# Patient Record
Sex: Male | Born: 1950
Health system: Southern US, Community
[De-identification: ages and names within clinical notes are randomized; demographics above are authoritative.]

## PROBLEM LIST (undated history)

## (undated) DIAGNOSIS — E785 Hyperlipidemia, unspecified: Secondary | ICD-10-CM

## (undated) DIAGNOSIS — I10 Essential (primary) hypertension: Secondary | ICD-10-CM

## (undated) DIAGNOSIS — I2510 Atherosclerotic heart disease of native coronary artery without angina pectoris: Secondary | ICD-10-CM

## (undated) DIAGNOSIS — E119 Type 2 diabetes mellitus without complications: Secondary | ICD-10-CM

## (undated) DIAGNOSIS — C801 Malignant (primary) neoplasm, unspecified: Secondary | ICD-10-CM

## (undated) DIAGNOSIS — M2550 Pain in unspecified joint: Secondary | ICD-10-CM

## (undated) DIAGNOSIS — I252 Old myocardial infarction: Secondary | ICD-10-CM

## (undated) DIAGNOSIS — U071 COVID-19: Secondary | ICD-10-CM

## (undated) HISTORY — PX: CORONARY ANGIOPLASTY WITH STENT PLACEMENT: SHX49

## (undated) HISTORY — DX: Hyperlipidemia, unspecified: E78.5

## (undated) HISTORY — PX: CARDIAC CATHETERIZATION: SHX172

## (undated) HISTORY — DX: Malignant (primary) neoplasm, unspecified: C80.1

## (undated) HISTORY — DX: Atherosclerotic heart disease of native coronary artery without angina pectoris: I25.10

## (undated) HISTORY — DX: Type 2 diabetes mellitus without complications: E11.9

## (undated) HISTORY — DX: Pain in unspecified joint: M25.50

## (undated) HISTORY — DX: Essential (primary) hypertension: I10

## (undated) HISTORY — PX: COLON SURGERY: SHX602

## (undated) HISTORY — DX: COVID-19: U07.1

---

## 1998-04-08 ENCOUNTER — Inpatient Hospital Stay (HOSPITAL_COMMUNITY): Admission: EM | Admit: 1998-04-08 | Discharge: 1998-04-11 | Payer: Self-pay | Admitting: Cardiology

## 1999-11-06 ENCOUNTER — Ambulatory Visit (HOSPITAL_BASED_OUTPATIENT_CLINIC_OR_DEPARTMENT_OTHER): Admission: RE | Admit: 1999-11-06 | Discharge: 1999-11-06 | Payer: Self-pay | Admitting: General Surgery

## 1999-11-06 ENCOUNTER — Encounter (INDEPENDENT_AMBULATORY_CARE_PROVIDER_SITE_OTHER): Payer: Self-pay | Admitting: *Deleted

## 2004-05-06 ENCOUNTER — Ambulatory Visit: Payer: Self-pay | Admitting: Cardiology

## 2008-06-14 ENCOUNTER — Ambulatory Visit: Admission: RE | Admit: 2008-06-14 | Discharge: 2008-08-30 | Payer: Self-pay | Admitting: Radiation Oncology

## 2008-08-30 ENCOUNTER — Ambulatory Visit: Admission: RE | Admit: 2008-08-30 | Discharge: 2008-10-27 | Payer: Self-pay | Admitting: Radiation Oncology

## 2010-11-13 NOTE — Op Note (Signed)
[facility]  Patient:    Arthur Torres, Arthur Torres                       MRN: [id number] Proc. Date: 11/06/99 Adm. Date:  [id number] Disc. Date: [id number] Attending:  Brandy Hale CC:         Everardo All. Madilyn Fireman, M.D.             Monica Becton, M.D.                           Operative Report  PREOPERATIVE DIAGNOSIS:  Internal and external hemorrhoids, pruritis ani.  POSTOPERATIVE DIAGNOSIS:  Internal and external hemorrhoids, pruritis ani.  OPERATION PERFORMED:  Rigid proctoscopy, injection of sclerosing solution into internal hemorrhoids, excision large posterior external hemorrhoid, anal stretch.  SURGEON:  Angelia Mould. Derrell Lolling, M.D.  ANESTHESIA:  INDICATIONS FOR PROCEDURE:  The patient is a 60 year old white male with a several year history of intermittent episodes of hemorrhoidal swelling.  For the past year, he has had large hemorrhoid on the left side posteriorly which has not resolved and also had severe itching.  He was evaluated recently approximately four weeks ago and was found to have a large noninflamed external hemorrhoid tag on the left posterior position and significant pruritis ani.  He had some internal hemorrhoids which were stage 2, otherwise normal looking rectal mucosa.  He has been treated aggressively for his pruritis ani and that has improved a great deal.  He was brought to the operating room for surgrey on his hemorrhoids.  DESCRIPTION OF PROCEDURE:  Following the induction of general endotracheal anesthesia, the patient was placed in the dorsal lithotomy position.  The perianal area was prepped and draped in sterile fashion.  Rigid proctoscopy was carried out to 25 cm.  The mucosa of the distal sigmoid and rectum looked normal.  There were no polyps, no gross tumor, no signs of inflammatory change.  The proctoscope was removed.  Anoscope was inserted.  I found that he had circumferential internal hemorrhoids which  were not prolapsed.  These appeared to be quite amenable to injection therapy and I injected sclerosing solution into the hemorrhoids in the left lateral, right posterior and right anterior positions  I then gently performed anal dilatation over the space of about five minutes to stretch the anal sphincter muscles.  This was performed until I could insert four fingers into the rectum without much problem.  Anoscope was inserted.  The large hemorrhoid in the left posterior position was identified and placed on traction.  A transfixion suture of 2-0 chromic was placed in the rectal mucosa above this hemorrhoidal pile.  Excision of the large hemorrhoid was performed with electrocautery.  The mucosa, dentate line, and anoderm were closed carefully with a running suture of 2-0 chromic.  The hemorrhoid was sent to pathology.  Hemostasis was excellent and achieved with electrocautery and suture.  At the completion of the case there was no significant external hemorrhoids and there was no bleeding.  A clean external bandage was placed.  The patient was taken to the recovery room instable condition.  Estimated blood loss was about 15 to 20 cc.  Complications were none.  Sponge, needle and instrument counts were correct. DD:  11/06/99 TD:  11/09/99 Job: [id number]

## 2012-10-03 ENCOUNTER — Ambulatory Visit (INDEPENDENT_AMBULATORY_CARE_PROVIDER_SITE_OTHER): Payer: BC Managed Care – PPO | Admitting: Nurse Practitioner

## 2012-10-03 ENCOUNTER — Encounter: Payer: Self-pay | Admitting: Nurse Practitioner

## 2012-10-03 VITALS — BP 113/68 | HR 63 | Temp 96.5°F | Ht 70.0 in | Wt 190.0 lb

## 2012-10-03 DIAGNOSIS — Z9861 Coronary angioplasty status: Secondary | ICD-10-CM

## 2012-10-03 DIAGNOSIS — E785 Hyperlipidemia, unspecified: Secondary | ICD-10-CM

## 2012-10-03 DIAGNOSIS — E559 Vitamin D deficiency, unspecified: Secondary | ICD-10-CM

## 2012-10-03 DIAGNOSIS — E119 Type 2 diabetes mellitus without complications: Secondary | ICD-10-CM | POA: Insufficient documentation

## 2012-10-03 DIAGNOSIS — E1169 Type 2 diabetes mellitus with other specified complication: Secondary | ICD-10-CM | POA: Insufficient documentation

## 2012-10-03 DIAGNOSIS — I10 Essential (primary) hypertension: Secondary | ICD-10-CM

## 2012-10-03 DIAGNOSIS — Z955 Presence of coronary angioplasty implant and graft: Secondary | ICD-10-CM

## 2012-10-03 LAB — COMPLETE METABOLIC PANEL WITH GFR
ALT: 24 U/L (ref 0–53)
AST: 18 U/L (ref 0–37)
Albumin: 4.5 g/dL (ref 3.5–5.2)
Alkaline Phosphatase: 65 U/L (ref 39–117)
BUN: 28 mg/dL — ABNORMAL HIGH (ref 6–23)
CO2: 26 mEq/L (ref 19–32)
Calcium: 9.9 mg/dL (ref 8.4–10.5)
Chloride: 102 mEq/L (ref 96–112)
Creat: 1.11 mg/dL (ref 0.50–1.35)
GFR, Est African American: 84 mL/min
GFR, Est Non African American: 72 mL/min
Glucose, Bld: 120 mg/dL — ABNORMAL HIGH (ref 70–99)
Potassium: 4.5 mEq/L (ref 3.5–5.3)
Sodium: 137 mEq/L (ref 135–145)
Total Bilirubin: 0.7 mg/dL (ref 0.3–1.2)
Total Protein: 7.4 g/dL (ref 6.0–8.3)

## 2012-10-03 LAB — POCT GLYCOSYLATED HEMOGLOBIN (HGB A1C): Hemoglobin A1C: 6

## 2012-10-03 NOTE — Patient Instructions (Signed)
Health Maintenance, Males A healthy lifestyle and preventative care can promote health and wellness.  Maintain regular health, dental, and eye exams.  Eat a healthy diet. Foods like vegetables, fruits, whole grains, low-fat dairy products, and lean protein foods contain the nutrients you need without too many calories. Decrease your intake of foods high in solid fats, added sugars, and salt. Get information about a proper diet from your caregiver, if necessary.  Regular physical exercise is one of the most important things you can do for your health. Most adults should get at least 150 minutes of moderate-intensity exercise (any activity that increases your heart rate and causes you to sweat) each week. In addition, most adults need muscle-strengthening exercises on 2 or more days a week.   Maintain a healthy weight. The body mass index (BMI) is a screening tool to identify possible weight problems. It provides an estimate of body fat based on height and weight. Your caregiver can help determine your BMI, and can help you achieve or maintain a healthy weight. For adults 20 years and older:  A BMI below 18.5 is considered underweight.  A BMI of 18.5 to 24.9 is normal.  A BMI of 25 to 29.9 is considered overweight.  A BMI of 30 and above is considered obese.  Maintain normal blood lipids and cholesterol by exercising and minimizing your intake of saturated fat. Eat a balanced diet with plenty of fruits and vegetables. Blood tests for lipids and cholesterol should begin at age 20 and be repeated every 5 years. If your lipid or cholesterol levels are high, you are over 50, or you are a high risk for heart disease, you may need your cholesterol levels checked more frequently.Ongoing high lipid and cholesterol levels should be treated with medicines, if diet and exercise are not effective.  If you smoke, find out from your caregiver how to quit. If you do not use tobacco, do not start.  If you  choose to drink alcohol, do not exceed 2 drinks per day. One drink is considered to be 12 ounces (355 mL) of beer, 5 ounces (148 mL) of wine, or 1.5 ounces (44 mL) of liquor.  Avoid use of street drugs. Do not share needles with anyone. Ask for help if you need support or instructions about stopping the use of drugs.  High blood pressure causes heart disease and increases the risk of stroke. Blood pressure should be checked at least every 1 to 2 years. Ongoing high blood pressure should be treated with medicines if weight loss and exercise are not effective.  If you are 45 to 60 years old, ask your caregiver if you should take aspirin to prevent heart disease.  Diabetes screening involves taking a blood sample to check your fasting blood sugar level. This should be done once every 3 years, after age 45, if you are within normal weight and without risk factors for diabetes. Testing should be considered at a younger age or be carried out more frequently if you are overweight and have at least 1 risk factor for diabetes.  Colorectal cancer can be detected and often prevented. Most routine colorectal cancer screening begins at the age of 50 and continues through age 75. However, your caregiver may recommend screening at an earlier age if you have risk factors for colon cancer. On a yearly basis, your caregiver may provide home test kits to check for hidden blood in the stool. Use of a small camera at the end of a tube,   to directly examine the colon (sigmoidoscopy or colonoscopy), can detect the earliest forms of colorectal cancer. Talk to your caregiver about this at age 50, when routine screening begins. Direct examination of the colon should be repeated every 5 to 10 years through age 75, unless early forms of pre-cancerous polyps or small growths are found.  Hepatitis C blood testing is recommended for all people born from 1945 through 1965 and any individual with known risks for hepatitis C.  Healthy  men should no longer receive prostate-specific antigen (PSA) blood tests as part of routine cancer screening. Consult with your caregiver about prostate cancer screening.  Testicular cancer screening is not recommended for adolescents or adult males who have no symptoms. Screening includes self-exam, caregiver exam, and other screening tests. Consult with your caregiver about any symptoms you have or any concerns you have about testicular cancer.  Practice safe sex. Use condoms and avoid high-risk sexual practices to reduce the spread of sexually transmitted infections (STIs).  Use sunscreen with a sun protection factor (SPF) of 30 or greater. Apply sunscreen liberally and repeatedly throughout the day. You should seek shade when your shadow is shorter than you. Protect yourself by wearing long sleeves, pants, a wide-brimmed hat, and sunglasses year round, whenever you are outdoors.  Notify your caregiver of new moles or changes in moles, especially if there is a change in shape or color. Also notify your caregiver if a mole is larger than the size of a pencil eraser.  A one-time screening for abdominal aortic aneurysm (AAA) and surgical repair of large AAAs by sound wave imaging (ultrasonography) is recommended for ages 65 to 75 years who are current or former smokers.  Stay current with your immunizations. Document Released: 12/11/2007 Document Revised: 09/06/2011 Document Reviewed: 11/09/2010 [employer] Patient Information 2013 [employer].  

## 2012-10-03 NOTE — Progress Notes (Signed)
Subjective:    Patient ID: Arthur Torres, male    DOB: 1951/01/28, 62 y.o.   MRN: [id number]  Hyperlipidemia This is a chronic problem. The current episode started more than 1 year ago. The problem is controlled. Recent lipid tests were reviewed and are normal. Exacerbating diseases include diabetes. There are no known factors aggravating his hyperlipidemia. Pertinent negatives include no chest pain, focal weakness, leg pain, myalgias or shortness of breath. Current antihyperlipidemic treatment includes bile acid squestrants and ezetimibe. The current treatment provides significant improvement of lipids. There are no compliance problems.  Risk factors for coronary artery disease include diabetes mellitus, hypertension and male sex.  Diabetes He presents for his follow-up diabetic visit. He has type 2 diabetes mellitus. No MedicAlert identification noted. The initial diagnosis of diabetes was made 6 months ago. His disease course has been stable. There are no hypoglycemic associated symptoms. Pertinent negatives for hypoglycemia include no headaches. Associated symptoms include polyuria. Pertinent negatives for diabetes include no chest pain, no fatigue, no polydipsia, no polyphagia, no visual change, no weakness and no weight loss. There are no hypoglycemic complications. Symptoms are stable. There are no diabetic complications. Risk factors for coronary artery disease include dyslipidemia, hypertension, male sex and family history. Current diabetic treatment includes oral agent (monotherapy) and diet. He is compliant with treatment all of the time. His weight is stable. He is following a generally healthy diet. When asked about meal planning, he reported none. He has not had a previous visit with a dietician. He rarely participates in exercise. His home blood glucose trend is decreasing steadily. His breakfast blood glucose is taken between 7-8 am. His breakfast blood glucose range is generally 110-130  mg/dl. His overall blood glucose range is 110-130 mg/dl. An ACE inhibitor/angiotensin II receptor blocker is not being taken. He does not see a podiatrist.Eye exam is not current (at least 2 years ago).  Hypertension This is a chronic problem. The current episode started more than 1 year ago. The problem is unchanged. The problem is controlled. Pertinent negatives include no chest pain, headaches, malaise/fatigue, neck pain, palpitations or shortness of breath. There are no associated agents to hypertension. Risk factors for coronary artery disease include diabetes mellitus, dyslipidemia and male gender. Past treatments include beta blockers. The current treatment provides significant improvement. There are no compliance problems.       Review of Systems  Constitutional: Negative for weight loss, malaise/fatigue and fatigue.  HENT: Negative for neck pain.   Respiratory: Negative for shortness of breath.   Cardiovascular: Negative for chest pain and palpitations.  Endocrine: Positive for polyuria. Negative for polydipsia and polyphagia.  Musculoskeletal: Negative for myalgias.  Neurological: Negative for focal weakness, weakness and headaches.  All other systems reviewed and are negative.       Objective:   Physical Exam  Constitutional: He is oriented to person, place, and time. He appears well-developed and well-nourished.  HENT:  Head: Normocephalic.  Right Ear: External ear normal.  Left Ear: External ear normal.  Nose: Nose normal.  Mouth/Throat: Oropharynx is clear and moist.  Eyes: EOM are normal. Pupils are equal, round, and reactive to light.  Neck: Normal range of motion. Neck supple. No JVD present. Carotid bruit is not present. No thyromegaly present.  Cardiovascular: Normal rate, regular rhythm, normal heart sounds and intact distal pulses.   No murmur heard. Pulmonary/Chest: Effort normal and breath sounds normal. He has no wheezes. He has no rales.  Abdominal: Soft.  Bowel sounds are  normal.  Musculoskeletal: Normal range of motion.  Neurological: He is alert and oriented to person, place, and time. He has normal reflexes.  (+) monofilament bil feet  Skin: Skin is warm and dry.  No callus formation bil feet  Psychiatric: He has a normal mood and affect. His behavior is normal. Judgment and thought content normal.   BP 113/68  Pulse 63  Temp(Src) 96.5 F (35.8 C) (Oral)  Ht 5\' 10"  (1.778 m)  Wt 190 lb (86.183 kg)  BMI 27.26 kg/m2 Results for orders placed in visit on 10/03/12  POCT GLYCOSYLATED HEMOGLOBIN (HGB A1C)      Result Value Range   Hemoglobin A1C 6.0            Assessment & Plan:  1. HTN (hypertension) Watch NA+ in diet - POCT glycosylated hemoglobin (Hb A1C) - COMPLETE METABOLIC PANEL WITH GFR - NMR Lipoprofile with Lipids  2. Hyperlipidemia LDL goal < 100 Low fat diet and exercise - POCT glycosylated hemoglobin (Hb A1C) - COMPLETE METABOLIC PANEL WITH GFR - NMR Lipoprofile with Lipids  3. Unspecified vitamin D deficiency - Vitamin D 25 hydroxy  4. Type II or unspecified type diabetes mellitus without mention of complication, not stated as uncontrolled Continue diabetic diet Continue Metformin as RX  5. Hx of right coronary artery stent placement - Ambulatory referral to Cardiology  Hemocult cards given to patient  Mary-Margaret Daphine Deutscher, FNP

## 2012-10-04 ENCOUNTER — Ambulatory Visit (INDEPENDENT_AMBULATORY_CARE_PROVIDER_SITE_OTHER): Payer: Self-pay | Admitting: Cardiology

## 2012-10-04 ENCOUNTER — Telehealth: Payer: Self-pay | Admitting: Nurse Practitioner

## 2012-10-04 ENCOUNTER — Other Ambulatory Visit: Payer: Self-pay | Admitting: Nurse Practitioner

## 2012-10-04 VITALS — BP 117/74 | HR 60 | Ht 70.0 in | Wt 190.0 lb

## 2012-10-04 DIAGNOSIS — I25810 Atherosclerosis of coronary artery bypass graft(s) without angina pectoris: Secondary | ICD-10-CM

## 2012-10-04 LAB — NMR LIPOPROFILE WITH LIPIDS
Cholesterol, Total: 158 mg/dL (ref <200)
HDL Particle Number: 28.2 umol/L — ABNORMAL LOW (ref >=30.5)
HDL Size: 8.5 nm — ABNORMAL LOW (ref >=9.2)
HDL-C: 39 mg/dL — ABNORMAL LOW (ref >=40)
LDL (calc): 101 mg/dL — ABNORMAL HIGH (ref <100)
LDL Particle Number: 1082 nmol/L — ABNORMAL HIGH (ref <1000)
LDL Size: 20.5 nm — ABNORMAL LOW (ref >20.5)
LP-IR Score: 59 — ABNORMAL HIGH (ref <=45)
Large HDL-P: <1.3 umol/L — ABNORMAL LOW (ref >=4.8)
Large VLDL-P: 1.6 nmol/L (ref <=2.7)
Small LDL Particle Number: 619 nmol/L — ABNORMAL HIGH (ref <=527)
Triglycerides: 89 mg/dL (ref <150)
VLDL Size: 44 nm (ref <=46.6)

## 2012-10-04 LAB — VITAMIN D 25 HYDROXY (VIT D DEFICIENCY, FRACTURES): Vit D, 25-Hydroxy: 13 ng/mL — ABNORMAL LOW (ref 30–89)

## 2012-10-04 MED ORDER — VITAMIN D3 1.25 MG (50000 UT) PO CAPS: 1.0000 | ORAL_CAPSULE | Freq: Every day | ORAL | Status: DC

## 2012-10-04 NOTE — Patient Instructions (Addendum)
The current medical regimen is effective;  continue present plan and medications.  Your physician has requested that you have an exercise tolerance test. For further information please visit [email or web address]. Please also follow instruction sheet, as given.   

## 2012-10-04 NOTE — Progress Notes (Signed)
HPI The patient presents for followup of known coronary disease. It has been several years since I saw him. He did have PCI and stenting in 1999. He denies any ongoing cardiovascular symptoms. Since that time he's had none of the chest discomfort that was his unstable angina. The patient denies any new symptoms such as chest discomfort, neck or arm discomfort. There has been no new shortness of breath, PND or orthopnea. There have been no reported palpitations, presyncope or syncope.  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 He cannot bring on any of the symptoms. However, it has been so long since his last evaluation he didn't want to be seen in followup. He's had no recent cardiovascular testing.  Of note he has been treated for prostate cancer since I last saw him. He  Allergies  Allergen Reactions  . Penicillins   . Zocor (Simvastatin)     Current Outpatient Prescriptions  Medication Sig Dispense Refill  . colesevelam (WELCHOL) 625 MG tablet Take 1,875 mg by mouth 2 (two) times daily with a meal.      . ezetimibe (ZETIA) 10 MG tablet Take 10 mg by mouth daily.      . metFORMIN (GLUCOPHAGE-XR) 500 MG 24 hr tablet Take 500 mg by mouth daily with breakfast.      . metoprolol succinate (TOPROL-XL) 50 MG 24 hr tablet Take 50 mg by mouth daily. Take 1/2 tab daily with or immediately following a meal.       No current facility-administered medications for this visit.    Past Medical History  Diagnosis Date  . Hypertension   . Hyperlipidemia   . Diabetes mellitus without complication   . CAD (coronary artery disease)   . Cancer     prostate  . Joint pain     Past Surgical History  Procedure Laterality Date  . Cardiac catheterization    . Coronary angioplasty with stent placement    . Colon surgery      hemrrhoids    Family History  Problem Relation Age of Onset  . Diabetes Mother   . Stroke Mother   . Cancer Father     skin, prostate, stomach     History   Social History  . Marital Status: Married    Spouse Name: N/A    Number of Children: N/A  . Years of Education: N/A   Occupational History  . Not on file.   Social History Main Topics  . Smoking status: Former Smoker    Types: Cigarettes    Quit date: 03/28/1998  . Smokeless tobacco: Not on file  . Alcohol Use: No  . Drug Use: No  . Sexually Active: Not on file   Other Topics Concern  . Not on file   Social History Narrative  . No narrative on file    ROS:  Positive for seasonal allergies. Otherwise negative for all other systems. 10/04/2012  PHYSICAL EXAM BP 117/74  Pulse 60  Ht 5\' 10"  (1.778 m)  Wt 190 lb (86.183 kg)  BMI 27.26 kg/m2 GENERAL:  Well appearing HEENT:  Pupils equal round and reactive, fundi not visualized, oral mucosa unremarkable NECK:  No jugular venous distention, waveform within normal limits, carotid upstroke brisk and symmetric, no bruits, no thyromegaly LYMPHATICS:  No cervical, inguinal adenopathy LUNGS:  Clear to auscultation bilaterally BACK:  No CVA tenderness CHEST:  Unremarkable HEART:  PMI not displaced or sustained,S1 and S2 within normal limits, no S3, no  S4, no clicks, no rubs, no murmurs ABD:  Flat, positive bowel sounds normal in frequency in pitch, no bruits, no rebound, no guarding, no midline pulsatile mass, no hepatomegaly, no splenomegaly EXT:  2 plus pulses throughout, no edema, no cyanosis no clubbing SKIN:  No rashes no nodules NEURO:  Cranial nerves II through XII grossly intact, motor grossly intact throughout PSYCH:  Cognitively intact, oriented to person place and time   EKG:  Sinus rhythm, rate 53, axis within normal limits, intervals within normal limits, no acute ST-T wave changes.  10/04/2012   ASSESSMENT AND PLAN  CAD:  Given the fact that it has been several years since his catheterization I will bring the patient back for a POET (Plain Old Exercise Test). This will allow me to screen for  obstructive coronary disease, risk stratify and very importantly provide a prescription for exercise.  DIABETES:  This is being actively followed by his primary team.  No change in therapy is indicated.  He just started metformin  HTN:  The blood pressure is at target. No change in medications is indicated. We will continue with therapeutic lifestyle changes (TLC).  HYPERLIPIDEMIA:  He was intolerant of Zocor.  He will continue on the meds as listed.

## 2012-10-04 NOTE — Telephone Encounter (Signed)
Someone has already called patient and took care of it he said

## 2012-10-06 DIAGNOSIS — Z955 Presence of coronary angioplasty implant and graft: Secondary | ICD-10-CM | POA: Insufficient documentation

## 2012-10-25 ENCOUNTER — Ambulatory Visit: Payer: Self-pay | Admitting: Cardiology

## 2012-10-26 ENCOUNTER — Ambulatory Visit (INDEPENDENT_AMBULATORY_CARE_PROVIDER_SITE_OTHER): Payer: BC Managed Care – PPO | Admitting: Physician Assistant

## 2012-10-26 DIAGNOSIS — I25810 Atherosclerosis of coronary artery bypass graft(s) without angina pectoris: Secondary | ICD-10-CM

## 2012-10-26 DIAGNOSIS — I2510 Atherosclerotic heart disease of native coronary artery without angina pectoris: Secondary | ICD-10-CM

## 2012-10-26 NOTE — Progress Notes (Signed)
Exercise Treadmill Test  Pre-Exercise Testing Evaluation Rhythm:NSR  normal sinus Rate: 67     Test  Exercise Tolerance Test Ordering MD: Angelina Sheriff, MD  Interpreting MD: Tereso Newcomer, PA-C  Unique Test No: 1  Treadmill:  1  Indication for ETT: CAD  Contraindication to ETT: No   Stress Modality: exercise - treadmill  Cardiac Imaging Performed: non   Protocol: standard Bruce - maximal  Max BP:  180/85  Max MPHR (bpm):  161 85% MPR (bpm):  137  MPHR obtained (bpm):  146 % MPHR obtained:  91%  Reached 85% MPHR (min:sec):  9:12 Total Exercise Time (min-sec):  10:00  Workload in METS:  11.7 Borg Scale: 15  Reason ETT Terminated:  patient's desire to stop    ST Segment Analysis At Rest: normal ST segments - no evidence of significant ST depression With Exercise: no evidence of significant ST depression  Other Information Arrhythmia:  No Angina during ETT:  absent (0) Quality of ETT:  diagnostic  ETT Interpretation:  normal - no evidence of ischemia by ST analysis  Comments: Good exercise tolerance. No chest pain. Normal BP response to exercise. No ST-T changes to suggest ischemia.  Good HR recovery in 1st minute after exercise.  Recommendations: F/u with Dr. Rollene Rotunda as directed. Signed,  Tereso Newcomer, PA-C  3:32 PM 10/26/2012

## 2012-10-31 ENCOUNTER — Other Ambulatory Visit (INDEPENDENT_AMBULATORY_CARE_PROVIDER_SITE_OTHER): Payer: BC Managed Care – PPO

## 2012-10-31 DIAGNOSIS — Z1212 Encounter for screening for malignant neoplasm of rectum: Secondary | ICD-10-CM

## 2012-11-01 LAB — FECAL OCCULT BLOOD, IMMUNOCHEMICAL: Fecal Occult Blood: NEGATIVE

## 2012-12-14 ENCOUNTER — Other Ambulatory Visit: Payer: Self-pay | Admitting: *Deleted

## 2012-12-14 MED ORDER — METOPROLOL SUCCINATE ER 50 MG PO TB24: ORAL_TABLET | ORAL | Status: DC

## 2013-01-02 ENCOUNTER — Other Ambulatory Visit: Payer: Self-pay

## 2013-01-02 MED ORDER — VITAMIN D3 1.25 MG (50000 UT) PO CAPS: 1.0000 | ORAL_CAPSULE | Freq: Every day | ORAL | Status: DC

## 2013-01-02 NOTE — Telephone Encounter (Signed)
Last seen 10/03/12  MMM last Vit D level 10/03/12  13 Low   Do you still want patient on this med?

## 2013-01-03 ENCOUNTER — Other Ambulatory Visit: Payer: BC Managed Care – PPO

## 2013-01-03 ENCOUNTER — Ambulatory Visit (INDEPENDENT_AMBULATORY_CARE_PROVIDER_SITE_OTHER): Payer: BC Managed Care – PPO | Admitting: Nurse Practitioner

## 2013-01-03 ENCOUNTER — Encounter: Payer: Self-pay | Admitting: Nurse Practitioner

## 2013-01-03 VITALS — BP 116/75 | HR 51 | Temp 96.5°F | Ht 71.0 in | Wt 190.0 lb

## 2013-01-03 DIAGNOSIS — E559 Vitamin D deficiency, unspecified: Secondary | ICD-10-CM

## 2013-01-03 DIAGNOSIS — E119 Type 2 diabetes mellitus without complications: Secondary | ICD-10-CM

## 2013-01-03 DIAGNOSIS — E785 Hyperlipidemia, unspecified: Secondary | ICD-10-CM

## 2013-01-03 DIAGNOSIS — IMO0001 Reserved for inherently not codable concepts without codable children: Secondary | ICD-10-CM

## 2013-01-03 DIAGNOSIS — I10 Essential (primary) hypertension: Secondary | ICD-10-CM

## 2013-01-03 LAB — COMPLETE METABOLIC PANEL WITH GFR
ALT: 24 U/L (ref 0–53)
AST: 18 U/L (ref 0–37)
Alkaline Phosphatase: 65 U/L (ref 39–117)
Creat: 1.22 mg/dL (ref 0.50–1.35)
Total Bilirubin: 0.8 mg/dL (ref 0.3–1.2)

## 2013-01-03 LAB — POCT GLYCOSYLATED HEMOGLOBIN (HGB A1C): Hemoglobin A1C: 6

## 2013-01-03 MED ORDER — EZETIMIBE 10 MG PO TABS: 10.0000 mg | ORAL_TABLET | Freq: Every day | ORAL | Status: DC

## 2013-01-03 MED ORDER — METOPROLOL SUCCINATE ER 50 MG PO TB24: ORAL_TABLET | ORAL | Status: DC

## 2013-01-03 MED ORDER — METFORMIN HCL ER 500 MG PO TB24: 500.0000 mg | ORAL_TABLET | Freq: Every day | ORAL | Status: DC

## 2013-01-03 NOTE — Patient Instructions (Signed)

## 2013-01-03 NOTE — Progress Notes (Signed)
Subjective:    Patient ID: Arthur Torres, male    DOB: 1951-03-11, 62 y.o.   MRN: [id number]  Hypertension This is a chronic problem. The current episode started more than 1 year ago. The problem has been resolved since onset. The problem is controlled. Pertinent negatives include no blurred vision, chest pain, headaches, orthopnea, palpitations, peripheral edema, shortness of breath or sweats. There are no associated agents to hypertension. Risk factors for coronary artery disease include diabetes mellitus, dyslipidemia and male gender. Past treatments include beta blockers. The current treatment provides significant improvement. There are no compliance problems.  Hypertensive end-organ damage includes CAD/MI (MI- 10/99). Identifiable causes of hypertension include chronic renal disease.  Hyperlipidemia This is a chronic problem. The current episode started more than 1 year ago. The problem is controlled. Recent lipid tests were reviewed and are normal. Exacerbating diseases include chronic renal disease, diabetes, hypothyroidism, liver disease and obesity. There are no known factors aggravating his hyperlipidemia. Pertinent negatives include no chest pain, leg pain, myalgias or shortness of breath. Current antihyperlipidemic treatment includes bile acid squestrants (patient wants to stop the welchol due to expense but remain on the zetia.). The current treatment provides moderate improvement of lipids. There are no compliance problems.  Risk factors for coronary artery disease include diabetes mellitus, hypertension and male sex.  Diabetes He presents for his follow-up diabetic visit. He has type 2 diabetes mellitus. No MedicAlert identification noted. The initial diagnosis of diabetes was made 6 years ago. His disease course has been stable. There are no hypoglycemic associated symptoms. Pertinent negatives for hypoglycemia include no headaches or sweats. Pertinent negatives for diabetes include no  blurred vision, no chest pain, no polydipsia, no polyphagia, no polyuria, no weakness and no weight loss. There are no hypoglycemic complications. Symptoms are stable. There are no diabetic complications. Pertinent negatives for diabetic complications include no impotence, nephropathy or peripheral neuropathy. Risk factors for coronary artery disease include dyslipidemia, hypertension and male sex. Current diabetic treatment includes oral agent (monotherapy). He is compliant with treatment all of the time. His weight is stable. When asked about meal planning, he reported none. He has not had a previous visit with a dietician. He rarely participates in exercise. There is no change in his home blood glucose trend. His breakfast blood glucose is taken between 8-9 am. His breakfast blood glucose range is generally 90-110 mg/dl. An ACE inhibitor/angiotensin II receptor blocker is not being taken. Eye exam is current (greater than a year ago).      Review of Systems  Constitutional: Negative for weight loss.  Eyes: Negative for blurred vision.  Respiratory: Negative for shortness of breath.   Cardiovascular: Negative for chest pain, palpitations and orthopnea.  Endocrine: Negative for polydipsia, polyphagia and polyuria.  Genitourinary: Negative for impotence.  Musculoskeletal: Negative for myalgias.  Neurological: Negative for weakness and headaches.  All other systems reviewed and are negative.       Objective:   Physical Exam  Constitutional: He is oriented to person, place, and time. He appears well-developed and well-nourished.  HENT:  Head: Normocephalic.  Right Ear: External ear normal.  Left Ear: External ear normal.  Nose: Nose normal.  Mouth/Throat: Oropharynx is clear and moist.  Eyes: EOM are normal. Pupils are equal, round, and reactive to light.  Neck: Normal range of motion. Neck supple. No thyromegaly present.  Cardiovascular: Normal rate, regular rhythm, normal heart sounds  and intact distal pulses.   No murmur heard. Pulmonary/Chest: Effort normal and breath  sounds normal. He has no wheezes. He has no rales.  Abdominal: Soft. Bowel sounds are normal.  Genitourinary:  History of prostate cancer- sees urologist  Musculoskeletal: Normal range of motion.  Neurological: He is alert and oriented to person, place, and time.  Skin: Skin is warm and dry.  Psychiatric: He has a normal mood and affect. His behavior is normal. Judgment and thought content normal.   BP 116/75  Pulse 51  Temp(Src) 96.5 F (35.8 C) (Oral)  Ht 5\' 11"  (1.803 m)  Wt 190 lb (86.183 kg)  BMI 26.51 kg/m2  Results for orders placed in visit on 01/03/13  POCT GLYCOSYLATED HEMOGLOBIN (HGB A1C)      Result Value Range   Hemoglobin A1C 6.0           Assessment & Plan:   1. Hyperlipidemia   2. Unspecified vitamin D deficiency   3. Hypertension   4. Type II or unspecified type diabetes mellitus without mention of complication, not stated as uncontrolled     Orders Placed This Encounter  Procedures  . COMPLETE METABOLIC PANEL WITH GFR  . NMR Lipoprofile with Lipids  . Vitamin D 25 hydroxy  . POCT glycosylated hemoglobin (Hb A1C)   Meds ordered this encounter  Medications  . ezetimibe (ZETIA) 10 MG tablet    Sig: Take 1 tablet (10 mg total) by mouth daily.    Dispense:  30 tablet    Refill:  5    Order Specific Question:  Supervising Provider    Answer:  Ernestina Penna [[id number]]  . metFORMIN (GLUCOPHAGE-XR) 500 MG 24 hr tablet    Sig: Take 1 tablet (500 mg total) by mouth daily with breakfast.    Dispense:  30 tablet    Refill:  5    Order Specific Question:  Supervising Provider    Answer:  Ernestina Penna [[id number]]  . metoprolol succinate (TOPROL-XL) 50 MG 24 hr tablet    Sig: Take 1/2 tab daily with or immediately following a meal.    Dispense:  30 tablet    Refill:  5    Order Specific Question:  Supervising Provider    Answer:  Deborra Medina   Continue  current meds Labs pending- Agreed to let patient hold welchol to see how he does- will recheck labs in 3 months Diet and exercise encouraged Follow up in 3 months  Mary-Margaret Daphine Deutscher, FNP

## 2013-01-04 LAB — VITAMIN D 25 HYDROXY (VIT D DEFICIENCY, FRACTURES): Vit D, 25-Hydroxy: 42 ng/mL (ref 30–89)

## 2013-01-04 LAB — NMR LIPOPROFILE WITH LIPIDS
Cholesterol, Total: 184 mg/dL (ref <200)
HDL Particle Number: 29 umol/L — ABNORMAL LOW (ref >=30.5)
HDL-C: 37 mg/dL — ABNORMAL LOW (ref >=40)
LDL (calc): 126 mg/dL — ABNORMAL HIGH (ref <100)
LP-IR Score: 54 — ABNORMAL HIGH (ref <=45)

## 2013-02-21 ENCOUNTER — Telehealth: Payer: Self-pay | Admitting: Nurse Practitioner

## 2013-02-21 NOTE — Telephone Encounter (Signed)
Samples of welchol up front

## 2013-04-13 ENCOUNTER — Ambulatory Visit (INDEPENDENT_AMBULATORY_CARE_PROVIDER_SITE_OTHER): Payer: BC Managed Care – PPO | Admitting: Nurse Practitioner

## 2013-04-13 ENCOUNTER — Encounter: Payer: Self-pay | Admitting: Nurse Practitioner

## 2013-04-13 VITALS — BP 135/85 | HR 53 | Temp 97.9°F | Ht 70.5 in | Wt 196.0 lb

## 2013-04-13 DIAGNOSIS — I10 Essential (primary) hypertension: Secondary | ICD-10-CM

## 2013-04-13 DIAGNOSIS — I25810 Atherosclerosis of coronary artery bypass graft(s) without angina pectoris: Secondary | ICD-10-CM

## 2013-04-13 DIAGNOSIS — Z23 Encounter for immunization: Secondary | ICD-10-CM

## 2013-04-13 DIAGNOSIS — E119 Type 2 diabetes mellitus without complications: Secondary | ICD-10-CM

## 2013-04-13 DIAGNOSIS — E785 Hyperlipidemia, unspecified: Secondary | ICD-10-CM

## 2013-04-13 LAB — POCT GLYCOSYLATED HEMOGLOBIN (HGB A1C): Hemoglobin A1C: 6.3

## 2013-04-13 MED ORDER — METOPROLOL SUCCINATE ER 50 MG PO TB24: ORAL_TABLET | ORAL | Status: DC

## 2013-04-13 MED ORDER — COLESEVELAM HCL 625 MG PO TABS: 1875.0000 mg | ORAL_TABLET | Freq: Two times a day (BID) | ORAL | Status: DC

## 2013-04-13 NOTE — Patient Instructions (Signed)

## 2013-04-13 NOTE — Progress Notes (Signed)
Subjective:    Patient ID: Arthur Torres, male    DOB: [birth year], 62 y.o.   MRN: [id number]  Hypertension This is a chronic problem. The current episode started more than 1 year ago. The problem has been waxing and waning since onset. The problem is uncontrolled. Pertinent negatives include no blurred vision, chest pain, headaches, orthopnea, palpitations, peripheral edema, shortness of breath or sweats. There are no associated agents to hypertension. Risk factors for coronary artery disease include diabetes mellitus, dyslipidemia, male gender and family history. Past treatments include beta blockers. The current treatment provides significant improvement. There are no compliance problems.  Hypertensive end-organ damage includes CAD/MI (MI- 10/99).  Hyperlipidemia This is a chronic problem. The current episode started more than 1 year ago. The problem is controlled. Recent lipid tests were reviewed and are normal. Exacerbating diseases include diabetes. He has no history of hypothyroidism, liver disease or obesity. There are no known factors aggravating his hyperlipidemia. Pertinent negatives include no chest pain, leg pain, myalgias or shortness of breath. Current antihyperlipidemic treatment includes bile acid squestrants (patient wants to stop the welchol due to expense but remain on the zetia.). The current treatment provides moderate improvement of lipids. There are no compliance problems.  Risk factors for coronary artery disease include diabetes mellitus, hypertension, male sex, dyslipidemia and family history.  Diabetes He presents for his follow-up diabetic visit. He has type 2 diabetes mellitus. No MedicAlert identification noted. The initial diagnosis of diabetes was made 6 years ago. His disease course has been stable. There are no hypoglycemic associated symptoms. Pertinent negatives for hypoglycemia include no headaches or sweats. Pertinent negatives for diabetes include no blurred vision,  no chest pain, no polydipsia, no polyphagia, no polyuria, no weakness and no weight loss. There are no hypoglycemic complications. Symptoms are stable. There are no diabetic complications. Pertinent negatives for diabetic complications include no impotence, nephropathy or peripheral neuropathy. Risk factors for coronary artery disease include dyslipidemia, hypertension and male sex. Current diabetic treatment includes oral agent (monotherapy). He is compliant with treatment all of the time. His weight is stable. When asked about meal planning, he reported none. He has not had a previous visit with a dietician. He rarely participates in exercise. There is no change in his home blood glucose trend. His breakfast blood glucose is taken between 8-9 am. His breakfast blood glucose range is generally 90-110 mg/dl. An ACE inhibitor/angiotensin II receptor blocker is not being taken. He does not see a podiatrist.Eye exam is not current (two years ago).      Review of Systems  Constitutional: Negative for weight loss.  Eyes: Negative for blurred vision.  Respiratory: Negative for shortness of breath.   Cardiovascular: Negative for chest pain, palpitations and orthopnea.  Endocrine: Negative for polydipsia, polyphagia and polyuria.  Genitourinary: Negative for impotence.  Musculoskeletal: Negative for myalgias.  Neurological: Negative for weakness and headaches.  All other systems reviewed and are negative.       Objective:   Physical Exam  Constitutional: He is oriented to person, place, and time. He appears well-developed and well-nourished.  HENT:  Head: Normocephalic.  Right Ear: External ear normal.  Left Ear: External ear normal.  Nose: Nose normal.  Mouth/Throat: Oropharynx is clear and moist.  Eyes: EOM are normal. Pupils are equal, round, and reactive to light.  Neck: Normal range of motion. Neck supple. No thyromegaly present.  Cardiovascular: Normal rate, regular rhythm, normal heart  sounds and intact distal pulses.   No murmur heard.  Pulmonary/Chest: Effort normal and breath sounds normal. He has no wheezes. He has no rales.  Abdominal: Soft. Bowel sounds are normal.  Genitourinary:  History of prostate cancer- sees urologist  Musculoskeletal: Normal range of motion.  Neurological: He is alert and oriented to person, place, and time.  Skin: Skin is warm and dry.  Psychiatric: He has a normal mood and affect. His behavior is normal. Judgment and thought content normal.   BP 151/90  Pulse 53  Temp(Src) 97.9 F (36.6 C) (Oral)  Ht 5' 10.5" (1.791 m)  Wt 196 lb (88.905 kg)  BMI 27.72 kg/m2  Results for orders placed in visit on 04/13/13  POCT GLYCOSYLATED HEMOGLOBIN (HGB A1C)      Result Value Range   Hemoglobin A1C 6.3%           Assessment & Plan:   1. Hyperlipidemia LDL goal < 100   2. Essential hypertension, benign   3. Type II or unspecified type diabetes mellitus without mention of complication, not stated as uncontrolled   4. CAD (coronary artery disease) of artery bypass graft   5. Need for prophylactic vaccination and inoculation against influenza   6. Hypertension    Orders Placed This Encounter  Procedures  . CMP14+EGFR  . NMR, lipoprofile  . POCT glycosylated hemoglobin (Hb A1C)   Meds ordered this encounter  Medications  . metoprolol succinate (TOPROL-XL) 50 MG 24 hr tablet    Sig: 1 PO QD    Dispense:  30 tablet    Refill:  5    Order Specific Question:  Supervising Provider    Answer:  Ernestina Penna [[id number]]  . colesevelam (WELCHOL) 625 MG tablet    Sig: Take 3 tablets (1,875 mg total) by mouth 2 (two) times daily with a meal.    Dispense:  180 tablet    Refill:  5    Order Specific Question:  Supervising Provider    Answer:  Deborra Medina    Continue all meds Labs pending Diet and exercise encouraged Health maintenance reviewed Follow up in 3 months  Mary-Margaret Daphine Deutscher, FNP

## 2013-04-14 LAB — CMP14+EGFR
ALT: 22 IU/L (ref 0–44)
AST: 14 IU/L (ref 0–40)
Albumin/Globulin Ratio: 1.9 (ref 1.1–2.5)
CO2: 25 mmol/L (ref 18–29)
Calcium: 9.3 mg/dL (ref 8.7–10.2)
Creatinine, Ser: 1.02 mg/dL (ref 0.76–1.27)
GFR calc non Af Amer: 80 mL/min/{1.73_m2} (ref >59)
Glucose: 129 mg/dL — ABNORMAL HIGH (ref 65–99)
Potassium: 4.6 mmol/L (ref 3.5–5.2)
Total Protein: 6.6 g/dL (ref 6.0–8.5)

## 2013-04-14 LAB — NMR, LIPOPROFILE
HDL Cholesterol by NMR: 39 mg/dL — ABNORMAL LOW (ref >=40)
LDLC SERPL CALC-MCNC: 128 mg/dL — ABNORMAL HIGH (ref <100)
Small LDL Particle Number: 737 nmol/L — ABNORMAL HIGH (ref <=527)
Triglycerides by NMR: 120 mg/dL (ref <150)

## 2013-08-15 ENCOUNTER — Ambulatory Visit: Payer: BC Managed Care – PPO | Admitting: Nurse Practitioner

## 2013-09-03 ENCOUNTER — Encounter (INDEPENDENT_AMBULATORY_CARE_PROVIDER_SITE_OTHER): Payer: Self-pay

## 2013-09-03 ENCOUNTER — Encounter: Payer: Self-pay | Admitting: Nurse Practitioner

## 2013-09-03 ENCOUNTER — Ambulatory Visit (INDEPENDENT_AMBULATORY_CARE_PROVIDER_SITE_OTHER): Payer: BC Managed Care – PPO | Admitting: Nurse Practitioner

## 2013-09-03 VITALS — BP 107/65 | HR 65 | Temp 97.1°F | Ht 70.0 in | Wt 195.0 lb

## 2013-09-03 DIAGNOSIS — Z125 Encounter for screening for malignant neoplasm of prostate: Secondary | ICD-10-CM

## 2013-09-03 DIAGNOSIS — I10 Essential (primary) hypertension: Secondary | ICD-10-CM

## 2013-09-03 DIAGNOSIS — E785 Hyperlipidemia, unspecified: Secondary | ICD-10-CM

## 2013-09-03 DIAGNOSIS — I25810 Atherosclerosis of coronary artery bypass graft(s) without angina pectoris: Secondary | ICD-10-CM

## 2013-09-03 DIAGNOSIS — E119 Type 2 diabetes mellitus without complications: Secondary | ICD-10-CM

## 2013-09-03 LAB — POCT GLYCOSYLATED HEMOGLOBIN (HGB A1C): Hemoglobin A1C: 6.4

## 2013-09-03 MED ORDER — METOPROLOL SUCCINATE ER 50 MG PO TB24: ORAL_TABLET | ORAL | Status: DC

## 2013-09-03 MED ORDER — METFORMIN HCL ER 500 MG PO TB24
500.0000 mg | ORAL_TABLET | Freq: Every day | ORAL | Status: DC
Start: 2013-09-03 — End: 2013-12-11

## 2013-09-03 NOTE — Progress Notes (Signed)
Subjective:    Patient ID: Arthur Torres, male    DOB: 09/17/1950, 63 y.o.   MRN: [id number]  Patient in today for follow up of chronic medical problems. No complaints today.  Hypertension This is a chronic problem. The current episode started more than 1 year ago. The problem has been resolved since onset. The problem is controlled. Pertinent negatives include no blurred vision, chest pain, headaches, orthopnea, palpitations, peripheral edema, shortness of breath or sweats. There are no associated agents to hypertension. Risk factors for coronary artery disease include diabetes mellitus, dyslipidemia and male gender. Past treatments include beta blockers. The current treatment provides significant improvement. There are no compliance problems.  Hypertensive end-organ damage includes CAD/MI (MI- 10/99). Identifiable causes of hypertension include chronic renal disease.  Hyperlipidemia This is a chronic problem. The current episode started more than 1 year ago. The problem is controlled. Recent lipid tests were reviewed and are normal. Exacerbating diseases include chronic renal disease, diabetes, hypothyroidism, liver disease and obesity. There are no known factors aggravating his hyperlipidemia. Pertinent negatives include no chest pain, leg pain, myalgias or shortness of breath. Current antihyperlipidemic treatment includes bile acid squestrants (patient wants to stop the welchol due to expense but remain on the zetia.). The current treatment provides moderate improvement of lipids. There are no compliance problems.  Risk factors for coronary artery disease include diabetes mellitus, hypertension and male sex.  Diabetes He presents for his follow-up diabetic visit. He has type 2 diabetes mellitus. No MedicAlert identification noted. The initial diagnosis of diabetes was made 6 years ago. His disease course has been stable. There are no hypoglycemic associated symptoms. Pertinent negatives for  hypoglycemia include no headaches or sweats. Pertinent negatives for diabetes include no blurred vision, no chest pain, no polydipsia, no polyphagia, no polyuria, no weakness and no weight loss. There are no hypoglycemic complications. Symptoms are stable. There are no diabetic complications. Pertinent negatives for diabetic complications include no impotence, nephropathy or peripheral neuropathy. Risk factors for coronary artery disease include dyslipidemia, hypertension and male sex. Current diabetic treatment includes oral agent (monotherapy). He is compliant with treatment all of the time. His weight is stable. When asked about meal planning, he reported none. He has not had a previous visit with a dietician. He rarely participates in exercise. There is no change in his home blood glucose trend. His breakfast blood glucose is taken between 8-9 am. His breakfast blood glucose range is generally 90-110 mg/dl. An ACE inhibitor/angiotensin II receptor blocker is not being taken. Eye exam is current (greater than a year ago).      Review of Systems  Constitutional: Negative for weight loss.  Eyes: Negative for blurred vision.  Respiratory: Negative for shortness of breath.   Cardiovascular: Negative for chest pain, palpitations and orthopnea.  Endocrine: Negative for polydipsia, polyphagia and polyuria.  Genitourinary: Negative for impotence.  Musculoskeletal: Negative for myalgias.  Neurological: Negative for weakness and headaches.  All other systems reviewed and are negative.       Objective:   Physical Exam  Constitutional: He is oriented to person, place, and time. He appears well-developed and well-nourished.  HENT:  Head: Normocephalic.  Right Ear: External ear normal.  Left Ear: External ear normal.  Nose: Nose normal.  Mouth/Throat: Oropharynx is clear and moist.  Eyes: EOM are normal. Pupils are equal, round, and reactive to light.  Neck: Normal range of motion. Neck supple. No  thyromegaly present.  Cardiovascular: Normal rate, regular rhythm, normal heart sounds  and intact distal pulses.   No murmur heard. Pulmonary/Chest: Effort normal and breath sounds normal. He has no wheezes. He has no rales.  Abdominal: Soft. Bowel sounds are normal.  Genitourinary:  History of prostate cancer- sees urologist  Musculoskeletal: Normal range of motion.  Neurological: He is alert and oriented to person, place, and time.  Skin: Skin is warm and dry.  Psychiatric: He has a normal mood and affect. His behavior is normal. Judgment and thought content normal.   BP 107/65  Pulse 65  Temp(Src) 97.1 F (36.2 C) (Oral)  Ht _0  (1.778 m)  Wt 195 lb (88.451 kg)  BMI 27.98 kg/m2  Results for orders placed in visit on 09/03/13  POCT GLYCOSYLATED HEMOGLOBIN (HGB A1C)      Result Value Ref Range   Hemoglobin A1C 6.4%           Assessment & Plan:   1. Type II or unspecified type diabetes mellitus without mention of complication, not stated as uncontrolled   2. Hyperlipidemia LDL goal < 100   3. Essential hypertension, benign   4. CAD (coronary artery disease) of artery bypass graft   5. Hypertension   6. Prostate cancer screening    Orders Placed This Encounter  Procedures  . CMP14+EGFR  . NMR, lipoprofile  . PSA, total and free  . POCT glycosylated hemoglobin (Hb A1C)   Meds ordered this encounter  Medications  . metFORMIN (GLUCOPHAGE-XR) 500 MG 24 hr tablet    Sig: Take 1 tablet (500 mg total) by mouth daily with breakfast.    Dispense:  30 tablet    Refill:  5    Order Specific Question:  Supervising Provider    Answer:  Chipper Herb [[id number]]  . metoprolol succinate (TOPROL-XL) 50 MG 24 hr tablet    Sig: 1 PO QD    Dispense:  30 tablet    Refill:  5    Order Specific Question:  Supervising Provider    Answer:  Chipper Herb [[id number]]    Labs pending Health maintenance reviewed Diet and exercise encouraged Continue all meds Follow up  In 3 months    [place], FNP

## 2013-09-03 NOTE — Patient Instructions (Signed)

## 2013-09-05 ENCOUNTER — Telehealth: Payer: Self-pay | Admitting: *Deleted

## 2013-09-05 LAB — CMP14+EGFR
A/G RATIO: 1.7 (ref 1.1–2.5)
ALT: 23 IU/L (ref 0–44)
AST: 16 IU/L (ref 0–40)
Albumin: 4.2 g/dL (ref 3.6–4.8)
Alkaline Phosphatase: 73 IU/L (ref 39–117)
BILIRUBIN TOTAL: 0.2 mg/dL (ref 0.0–1.2)
BUN/Creatinine Ratio: 19 (ref 10–22)
BUN: 21 mg/dL (ref 8–27)
CHLORIDE: 102 mmol/L (ref 97–108)
CO2: 21 mmol/L (ref 18–29)
Calcium: 9.5 mg/dL (ref 8.6–10.2)
Creatinine, Ser: 1.08 mg/dL (ref 0.76–1.27)
GFR, EST AFRICAN AMERICAN: 86 mL/min/{1.73_m2} (ref >59)
GFR, EST NON AFRICAN AMERICAN: 74 mL/min/{1.73_m2} (ref >59)
Globulin, Total: 2.5 g/dL (ref 1.5–4.5)
Glucose: 168 mg/dL — ABNORMAL HIGH (ref 65–99)
Potassium: 4.3 mmol/L (ref 3.5–5.2)
SODIUM: 142 mmol/L (ref 134–144)
Total Protein: 6.7 g/dL (ref 6.0–8.5)

## 2013-09-05 LAB — NMR, LIPOPROFILE
Cholesterol: 177 mg/dL (ref <200)
HDL Cholesterol by NMR: 34 mg/dL — ABNORMAL LOW (ref >=40)
HDL Particle Number: 24.9 umol/L — ABNORMAL LOW (ref >=30.5)
LDL PARTICLE NUMBER: 1494 nmol/L — AB (ref <1000)
LDL SIZE: 20.6 nm (ref >20.5)
LDLC SERPL CALC-MCNC: 74 mg/dL (ref <100)
LP-IR Score: 85 — ABNORMAL HIGH (ref <=45)
SMALL LDL PARTICLE NUMBER: 805 nmol/L — AB (ref <=527)
Triglycerides by NMR: 346 mg/dL — ABNORMAL HIGH (ref <150)

## 2013-09-05 LAB — PSA, TOTAL AND FREE
PSA FREE PCT: 10 %
PSA FREE: 0.02 ng/mL
PSA: 0.2 ng/mL (ref 0.0–4.0)

## 2013-09-05 MED ORDER — ROSUVASTATIN CALCIUM 10 MG PO TABS: 10.0000 mg | ORAL_TABLET | Freq: Every day | ORAL | Status: DC

## 2013-09-05 NOTE — Telephone Encounter (Signed)
Patient will try another statin but it has to be really cheap(welchol was not). Please send to [facility] in [place].

## 2013-09-05 NOTE — Telephone Encounter (Signed)
Have patient come and pick up rx because have coupon to get rx for $3

## 2013-09-05 NOTE — Telephone Encounter (Signed)
Message copied by Shelbie Ammons on Wed Sep 05, 2013  2:57 PM ------      Message from: Chevis Pretty      Created: Wed Sep 05, 2013  2:16 PM       Blood sugar elevated- need to watch carbs in diet      Hgba1c discussed at appointment      LDL particle # and LDL elevated- need to be on a statin- will agree to try another statin      PSA normal      Continue current meds- low fat diet and exercise and recheck in 3 months       ------

## 2013-09-06 ENCOUNTER — Telehealth: Payer: Self-pay | Admitting: *Deleted

## 2013-09-06 NOTE — Telephone Encounter (Signed)
Patient aware, rx ready.

## 2013-09-19 ENCOUNTER — Telehealth: Payer: Self-pay | Admitting: Nurse Practitioner

## 2013-09-19 NOTE — Telephone Encounter (Signed)
No samples at this time

## 2013-12-11 ENCOUNTER — Ambulatory Visit (INDEPENDENT_AMBULATORY_CARE_PROVIDER_SITE_OTHER): Payer: BC Managed Care – PPO | Admitting: Nurse Practitioner

## 2013-12-11 ENCOUNTER — Encounter: Payer: Self-pay | Admitting: Nurse Practitioner

## 2013-12-11 VITALS — BP 126/68 | HR 56 | Temp 98.0°F | Ht 70.0 in | Wt 194.4 lb

## 2013-12-11 DIAGNOSIS — Z23 Encounter for immunization: Secondary | ICD-10-CM

## 2013-12-11 DIAGNOSIS — I10 Essential (primary) hypertension: Secondary | ICD-10-CM

## 2013-12-11 DIAGNOSIS — I25810 Atherosclerosis of coronary artery bypass graft(s) without angina pectoris: Secondary | ICD-10-CM

## 2013-12-11 DIAGNOSIS — E119 Type 2 diabetes mellitus without complications: Secondary | ICD-10-CM

## 2013-12-11 DIAGNOSIS — E785 Hyperlipidemia, unspecified: Secondary | ICD-10-CM

## 2013-12-11 LAB — POCT GLYCOSYLATED HEMOGLOBIN (HGB A1C): Hemoglobin A1C: 6.7

## 2013-12-11 MED ORDER — METFORMIN HCL ER 500 MG PO TB24: 500.0000 mg | ORAL_TABLET | Freq: Every day | ORAL | Status: DC

## 2013-12-11 MED ORDER — METOPROLOL SUCCINATE ER 50 MG PO TB24
ORAL_TABLET | ORAL | Status: DC
Start: 2013-12-11 — End: 2014-03-14

## 2013-12-11 NOTE — Progress Notes (Signed)
Subjective:    Patient ID: ARSHAWN VALDEZ, male    DOB: Dec 22, 1950, 63 y.o.   MRN: [id number]  Patient in today for follow up of chronic medical problems. No complaints today.  Hypertension This is a chronic problem. The current episode started more than 1 year ago. The problem has been resolved since onset. The problem is controlled. Pertinent negatives include no blurred vision, chest pain, headaches, orthopnea, palpitations, peripheral edema, shortness of breath or sweats. There are no associated agents to hypertension. Risk factors for coronary artery disease include diabetes mellitus, dyslipidemia and male gender. Past treatments include beta blockers. The current treatment provides significant improvement. There are no compliance problems.  Hypertensive end-organ damage includes CAD/MI (MI- 10/99). Identifiable causes of hypertension include chronic renal disease.  Hyperlipidemia This is a chronic problem. The current episode started more than 1 year ago. The problem is controlled. Recent lipid tests were reviewed and are normal. Exacerbating diseases include chronic renal disease, diabetes, hypothyroidism, liver disease and obesity. There are no known factors aggravating his hyperlipidemia. Pertinent negatives include no chest pain, leg pain, myalgias or shortness of breath. Current antihyperlipidemic treatment includes bile acid squestrants (patient wants to stop the welchol due to expense but remain on the zetia.). The current treatment provides moderate improvement of lipids. There are no compliance problems.  Risk factors for coronary artery disease include diabetes mellitus, hypertension and male sex.  Diabetes He presents for his follow-up diabetic visit. He has type 2 diabetes mellitus. No MedicAlert identification noted. The initial diagnosis of diabetes was made 6 years ago. His disease course has been stable. There are no hypoglycemic associated symptoms. Pertinent negatives for  hypoglycemia include no headaches or sweats. Pertinent negatives for diabetes include no blurred vision, no chest pain, no polydipsia, no polyphagia, no polyuria, no weakness and no weight loss. There are no hypoglycemic complications. Symptoms are stable. There are no diabetic complications. Pertinent negatives for diabetic complications include no impotence, nephropathy or peripheral neuropathy. Risk factors for coronary artery disease include dyslipidemia, hypertension and male sex. Current diabetic treatment includes oral agent (monotherapy). He is compliant with treatment all of the time. His weight is stable. When asked about meal planning, he reported none. He has not had a previous visit with a dietician. He rarely participates in exercise. There is no change in his home blood glucose trend. (Patient not checking blood sugars at home.) An ACE inhibitor/angiotensin II receptor blocker is not being taken. Eye exam is current (greater than a year ago).      Review of Systems  Constitutional: Negative for weight loss.  Eyes: Negative for blurred vision.  Respiratory: Negative for shortness of breath.   Cardiovascular: Negative for chest pain, palpitations and orthopnea.  Endocrine: Negative for polydipsia, polyphagia and polyuria.  Genitourinary: Negative for impotence.  Musculoskeletal: Negative for myalgias.  Neurological: Negative for weakness and headaches.  All other systems reviewed and are negative.      Objective:   Physical Exam  Constitutional: He is oriented to person, place, and time. He appears well-developed and well-nourished.  HENT:  Head: Normocephalic.  Right Ear: External ear normal.  Left Ear: External ear normal.  Nose: Nose normal.  Mouth/Throat: Oropharynx is clear and moist.  Eyes: EOM are normal. Pupils are equal, round, and reactive to light.  Neck: Normal range of motion. Neck supple. No thyromegaly present.  Cardiovascular: Normal rate, regular rhythm,  normal heart sounds and intact distal pulses.   No murmur heard. Pulmonary/Chest: Effort normal  and breath sounds normal. He has no wheezes. He has no rales.  Abdominal: Soft. Bowel sounds are normal.  Genitourinary:  History of prostate cancer- sees urologist  Musculoskeletal: Normal range of motion.  Neurological: He is alert and oriented to person, place, and time.  Skin: Skin is warm and dry.  Psychiatric: He has a normal mood and affect. His behavior is normal. Judgment and thought content normal.   BP 126/68  Pulse 56  Temp(Src) 98 F (36.7 C) (Oral)  Ht 5' 10"  (1.778 m)  Wt 194 lb 6.4 oz (88.179 kg)  BMI 27.89 kg/m2  Results for orders placed in visit on 12/11/13  POCT GLYCOSYLATED HEMOGLOBIN (HGB A1C)      Result Value Ref Range   Hemoglobin A1C 6.7           Assessment & Plan:   1. Hyperlipidemia LDL goal < 100   2. Type II or unspecified type diabetes mellitus without mention of complication, not stated as uncontrolled   3. CAD (coronary artery disease) of artery bypass graft   4. Hypertension    Orders Placed This Encounter  Procedures  . CMP14+EGFR  . NMR, lipoprofile  . POCT glycosylated hemoglobin (Hb A1C)   Meds ordered this encounter  Medications  . metoprolol succinate (TOPROL-XL) 50 MG 24 hr tablet    Sig: 1 PO QD    Dispense:  90 tablet    Refill:  1    Order Specific Question:  Supervising Kelleigh Skerritt    Answer:  Chipper Herb [[id number]]  . metFORMIN (GLUCOPHAGE-XR) 500 MG 24 hr tablet    Sig: Take 1 tablet (500 mg total) by mouth daily with breakfast.    Dispense:  90 tablet    Refill:  1    Order Specific Question:  Supervising Saleah Rishel    Answer:  Chipper Herb [[id number]]  hemoccult cards given to patient with directions Need to check blood sugars daily Labs pending Health maintenance reviewed Diet and exercise encouraged Continue all meds Follow up  In 3 months   [place], FNP

## 2013-12-11 NOTE — Patient Instructions (Signed)

## 2013-12-12 LAB — CMP14+EGFR
ALK PHOS: 64 IU/L (ref 39–117)
ALT: 22 IU/L (ref 0–44)
AST: 19 IU/L (ref 0–40)
Albumin/Globulin Ratio: 2.2 (ref 1.1–2.5)
Albumin: 4.6 g/dL (ref 3.6–4.8)
BUN / CREAT RATIO: 18 (ref 10–22)
BUN: 19 mg/dL (ref 8–27)
CHLORIDE: 101 mmol/L (ref 97–108)
CO2: 25 mmol/L (ref 18–29)
Calcium: 9.3 mg/dL (ref 8.6–10.2)
Creatinine, Ser: 1.04 mg/dL (ref 0.76–1.27)
GFR calc Af Amer: 90 mL/min/{1.73_m2} (ref >59)
GFR calc non Af Amer: 78 mL/min/{1.73_m2} (ref >59)
Globulin, Total: 2.1 g/dL (ref 1.5–4.5)
Glucose: 137 mg/dL — ABNORMAL HIGH (ref 65–99)
POTASSIUM: 4.3 mmol/L (ref 3.5–5.2)
Sodium: 138 mmol/L (ref 134–144)
Total Bilirubin: 0.6 mg/dL (ref 0.0–1.2)
Total Protein: 6.7 g/dL (ref 6.0–8.5)

## 2013-12-12 LAB — NMR, LIPOPROFILE
CHOLESTEROL: 97 mg/dL — AB (ref 100–199)
HDL Cholesterol by NMR: 43 mg/dL (ref >39)
HDL Particle Number: 32.2 umol/L (ref >=30.5)
LDL PARTICLE NUMBER: 532 nmol/L (ref <1000)
LDL SIZE: 20.1 nm (ref >20.5)
LDLC SERPL CALC-MCNC: 39 mg/dL (ref 0–99)
LP-IR SCORE: 44 (ref <=45)
Small LDL Particle Number: 346 nmol/L (ref <=527)
Triglycerides by NMR: 73 mg/dL (ref 0–149)

## 2014-03-14 ENCOUNTER — Encounter: Payer: Self-pay | Admitting: Nurse Practitioner

## 2014-03-14 ENCOUNTER — Ambulatory Visit (INDEPENDENT_AMBULATORY_CARE_PROVIDER_SITE_OTHER): Payer: BC Managed Care – PPO | Admitting: Nurse Practitioner

## 2014-03-14 VITALS — BP 116/74 | HR 53 | Temp 97.0°F | Ht 70.0 in | Wt 189.2 lb

## 2014-03-14 DIAGNOSIS — Z9861 Coronary angioplasty status: Secondary | ICD-10-CM

## 2014-03-14 DIAGNOSIS — E119 Type 2 diabetes mellitus without complications: Secondary | ICD-10-CM

## 2014-03-14 DIAGNOSIS — I10 Essential (primary) hypertension: Secondary | ICD-10-CM

## 2014-03-14 DIAGNOSIS — Z955 Presence of coronary angioplasty implant and graft: Secondary | ICD-10-CM

## 2014-03-14 DIAGNOSIS — E785 Hyperlipidemia, unspecified: Secondary | ICD-10-CM

## 2014-03-14 LAB — POCT GLYCOSYLATED HEMOGLOBIN (HGB A1C): HEMOGLOBIN A1C: 6.4

## 2014-03-14 LAB — POCT UA - MICROALBUMIN: Microalbumin Ur, POC: POSITIVE mg/L

## 2014-03-14 MED ORDER — METOPROLOL SUCCINATE ER 50 MG PO TB24: ORAL_TABLET | ORAL | Status: DC

## 2014-03-14 MED ORDER — METFORMIN HCL ER 500 MG PO TB24: 500.0000 mg | ORAL_TABLET | Freq: Every day | ORAL | Status: DC

## 2014-03-14 MED ORDER — ROSUVASTATIN CALCIUM 10 MG PO TABS: 10.0000 mg | ORAL_TABLET | Freq: Every day | ORAL | Status: DC

## 2014-03-14 NOTE — Addendum Note (Signed)
Addended by: Earlene Plater on: 03/14/2014 11:13 AM   Modules accepted: Orders

## 2014-03-14 NOTE — Progress Notes (Signed)
Subjective:    Patient ID: Arthur Torres, male    DOB: February 23, 1951, 63 y.o.   MRN: [id number]  Patient in today for follow up of chronic medical problems. No complaints today.  Diabetes He presents for his follow-up diabetic visit. He has type 2 diabetes mellitus. No MedicAlert identification noted. The initial diagnosis of diabetes was made 6 years ago. His disease course has been stable. There are no hypoglycemic associated symptoms. Pertinent negatives for hypoglycemia include no headaches or sweats. Pertinent negatives for diabetes include no blurred vision, no chest pain, no polydipsia, no polyphagia, no polyuria, no weakness and no weight loss. There are no hypoglycemic complications. Symptoms are stable. There are no diabetic complications. Pertinent negatives for diabetic complications include no impotence, nephropathy or peripheral neuropathy. Risk factors for coronary artery disease include dyslipidemia, hypertension and male sex. Current diabetic treatment includes oral agent (monotherapy). He is compliant with treatment all of the time. His weight is stable. When asked about meal planning, he reported none. He has not had a previous visit with a dietician. He rarely participates in exercise. There is no change in his home blood glucose trend. (Patient not checking blood sugars at home.) An ACE inhibitor/angiotensin II receptor blocker is not being taken. Eye exam is current (greater than a year ago).  Hypertension This is a chronic problem. The current episode started more than 1 year ago. The problem has been resolved since onset. The problem is controlled. Pertinent negatives include no blurred vision, chest pain, headaches, orthopnea, palpitations, peripheral edema, shortness of breath or sweats. There are no associated agents to hypertension. Risk factors for coronary artery disease include diabetes mellitus, dyslipidemia and male gender. Past treatments include beta blockers. The current  treatment provides significant improvement. There are no compliance problems.  Hypertensive end-organ damage includes CAD/MI (MI- 10/99). Identifiable causes of hypertension include chronic renal disease.  Hyperlipidemia This is a chronic problem. The current episode started more than 1 year ago. The problem is controlled. Recent lipid tests were reviewed and are normal. Exacerbating diseases include chronic renal disease, diabetes, hypothyroidism, liver disease and obesity. There are no known factors aggravating his hyperlipidemia. Pertinent negatives include no chest pain, leg pain, myalgias or shortness of breath. Current antihyperlipidemic treatment includes bile acid squestrants (patient wants to stop the welchol due to expense but remain on the zetia.). The current treatment provides moderate improvement of lipids. There are no compliance problems.  Risk factors for coronary artery disease include diabetes mellitus, hypertension and male sex.      Review of Systems  Constitutional: Negative for weight loss.  Eyes: Negative for blurred vision.  Respiratory: Negative for shortness of breath.   Cardiovascular: Negative for chest pain, palpitations and orthopnea.  Endocrine: Negative for polydipsia, polyphagia and polyuria.  Genitourinary: Negative for impotence.  Musculoskeletal: Negative for myalgias.  Neurological: Negative for weakness and headaches.  All other systems reviewed and are negative.      Objective:   Physical Exam  Constitutional: He is oriented to person, place, and time. He appears well-developed and well-nourished.  HENT:  Head: Normocephalic.  Right Ear: External ear normal.  Left Ear: External ear normal.  Nose: Nose normal.  Mouth/Throat: Oropharynx is clear and moist.  Eyes: EOM are normal. Pupils are equal, round, and reactive to light.  Neck: Normal range of motion. Neck supple. No thyromegaly present.  Cardiovascular: Normal rate, regular rhythm, normal  heart sounds and intact distal pulses.   No murmur heard. Pulmonary/Chest: Effort normal  and breath sounds normal. He has no wheezes. He has no rales.  Abdominal: Soft. Bowel sounds are normal.  Genitourinary:  History of prostate cancer- sees urologist  Musculoskeletal: Normal range of motion.  Neurological: He is alert and oriented to person, place, and time.  Skin: Skin is warm and dry.  Psychiatric: He has a normal mood and affect. His behavior is normal. Judgment and thought content normal.   BP 116/74  Pulse 53  Temp(Src) 97 F (36.1 C) (Oral)  Ht _0  (1.778 m)  Wt 189 lb 3.2 oz (85.821 kg)  BMI 27.15 kg/m2  Results for orders placed in visit on 03/14/14  POCT GLYCOSYLATED HEMOGLOBIN (HGB A1C)      Result Value Ref Range   Hemoglobin A1C 6.4           Assessment & Plan:    1. Hyperlipidemia with target LDL less than 100 - NMR, lipoprofile - rosuvastatin (CRESTOR) 10 MG tablet; Take 1 tablet (10 mg total) by mouth daily.  Dispense: 90 tablet; Refill: 1  2. Type II or unspecified type diabetes mellitus without mention of complication, not stated as uncontrolled - POCT glycosylated hemoglobin (Hb A1C) - POCT UA - Microalbumin - metFORMIN (GLUCOPHAGE-XR) 500 MG 24 hr tablet; Take 1 tablet (500 mg total) by mouth daily with breakfast.  Dispense: 90 tablet; Refill: 1  3. Essential hypertension, benign - CMP14+EGFR - metoprolol succinate (TOPROL-XL) 50 MG 24 hr tablet; 1 PO QD  Dispense: 90 tablet; Refill: 1  4. History of right coronary artery stent placement Keep follow up appointments with cardiologist  Labs pending Health maintenance reviewed Diet and exercise encouraged Continue all meds Follow up  In 71month   [place] FNP

## 2014-03-14 NOTE — Patient Instructions (Signed)

## 2014-03-15 LAB — CMP14+EGFR
ALBUMIN: 4.5 g/dL (ref 3.6–4.8)
ALT: 31 IU/L (ref 0–44)
AST: 19 IU/L (ref 0–40)
Albumin/Globulin Ratio: 1.8 (ref 1.1–2.5)
Alkaline Phosphatase: 69 IU/L (ref 39–117)
BUN/Creatinine Ratio: 18 (ref 10–22)
BUN: 20 mg/dL (ref 8–27)
CALCIUM: 9.7 mg/dL (ref 8.6–10.2)
CHLORIDE: 98 mmol/L (ref 97–108)
CO2: 23 mmol/L (ref 18–29)
Creatinine, Ser: 1.14 mg/dL (ref 0.76–1.27)
GFR calc Af Amer: 80 mL/min/{1.73_m2} (ref >59)
GFR calc non Af Amer: 70 mL/min/{1.73_m2} (ref >59)
GLOBULIN, TOTAL: 2.5 g/dL (ref 1.5–4.5)
GLUCOSE: 143 mg/dL — AB (ref 65–99)
Potassium: 4.4 mmol/L (ref 3.5–5.2)
Sodium: 136 mmol/L (ref 134–144)
TOTAL PROTEIN: 7 g/dL (ref 6.0–8.5)
Total Bilirubin: 0.5 mg/dL (ref 0.0–1.2)

## 2014-03-15 LAB — NMR, LIPOPROFILE
CHOLESTEROL: 109 mg/dL (ref 100–199)
HDL CHOLESTEROL BY NMR: 47 mg/dL (ref >39)
HDL PARTICLE NUMBER: 31.9 umol/L (ref >=30.5)
LDL PARTICLE NUMBER: 441 nmol/L (ref <1000)
LDL Size: 20.4 nm (ref >20.5)
LDLC SERPL CALC-MCNC: 48 mg/dL (ref 0–99)
LP-IR Score: 45 (ref <=45)
Small LDL Particle Number: 221 nmol/L (ref <=527)
TRIGLYCERIDES BY NMR: 72 mg/dL (ref 0–149)

## 2014-03-15 LAB — MICROALBUMIN, URINE: Microalbumin, Urine: 3.7 ug/mL (ref 0.0–17.0)

## 2014-03-18 ENCOUNTER — Telehealth: Payer: Self-pay | Admitting: *Deleted

## 2014-03-18 NOTE — Telephone Encounter (Signed)
Patient notified of lab results

## 2014-03-18 NOTE — Telephone Encounter (Signed)
Message copied by Rolena Infante on Mon Mar 18, 2014  3:05 PM ------      Message from: Chevis Pretty      Created: Sun Mar 17, 2014  2:10 PM       microalbumin normal      Hgba1c discussed at appointment      Kidney and liver function stable      Cholesterol looks great      Continue current meds- low fat diet and exercise and recheck in 3 months       ------

## 2014-04-16 ENCOUNTER — Other Ambulatory Visit: Payer: BC Managed Care – PPO

## 2014-04-16 DIAGNOSIS — Z1212 Encounter for screening for malignant neoplasm of rectum: Secondary | ICD-10-CM

## 2014-04-17 LAB — FECAL OCCULT BLOOD, IMMUNOCHEMICAL: Fecal Occult Bld: NEGATIVE

## 2014-05-07 ENCOUNTER — Encounter: Payer: Self-pay | Admitting: *Deleted

## 2014-06-25 ENCOUNTER — Encounter: Payer: Self-pay | Admitting: Nurse Practitioner

## 2014-06-25 ENCOUNTER — Ambulatory Visit (INDEPENDENT_AMBULATORY_CARE_PROVIDER_SITE_OTHER): Payer: BC Managed Care – PPO | Admitting: Nurse Practitioner

## 2014-06-25 VITALS — BP 118/68 | HR 68 | Temp 96.9°F | Ht 70.0 in | Wt 197.0 lb

## 2014-06-25 DIAGNOSIS — E785 Hyperlipidemia, unspecified: Secondary | ICD-10-CM

## 2014-06-25 DIAGNOSIS — I10 Essential (primary) hypertension: Secondary | ICD-10-CM

## 2014-06-25 DIAGNOSIS — E119 Type 2 diabetes mellitus without complications: Secondary | ICD-10-CM

## 2014-06-25 LAB — POCT GLYCOSYLATED HEMOGLOBIN (HGB A1C): Hemoglobin A1C: 7.1

## 2014-06-25 NOTE — Progress Notes (Signed)
Subjective:    Patient ID: Arthur Torres, male    DOB: 1950-11-21, 63 y.o.   MRN: [id number]  Patient in today for follow up of chronic medical problems. No complaints today.  Diabetes He presents for his follow-up diabetic visit. He has type 2 diabetes mellitus. No MedicAlert identification noted. His disease course has been fluctuating. Pertinent negatives for hypoglycemia include no headaches. Pertinent negatives for diabetes include no chest pain, no polydipsia, no polyphagia, no polyuria and no weakness. Current diabetic treatment includes oral agent (monotherapy). His weight is stable. When asked about meal planning, he reported none. He has not had a previous visit with a dietitian. He rarely participates in exercise. His home blood glucose trend is fluctuating minimally. His breakfast blood glucose is taken between 8-9 am. His breakfast blood glucose range is generally 110-130 mg/dl. His overall blood glucose range is 110-130 mg/dl. An ACE inhibitor/angiotensin II receptor blocker is not being taken. He does not see a podiatrist.Eye exam is not current.  Hypertension This is a chronic problem. The current episode started more than 1 year ago. The problem is uncontrolled. Pertinent negatives include no chest pain, headaches, palpitations or shortness of breath. Risk factors for coronary artery disease include dyslipidemia, diabetes mellitus and male gender. Past treatments include beta blockers.  Hyperlipidemia This is a chronic problem. The current episode started more than 1 year ago. The problem is uncontrolled. Recent lipid tests were reviewed and are high. Exacerbating diseases include diabetes. He has no history of hypothyroidism or obesity. Pertinent negatives include no chest pain, myalgias or shortness of breath. Current antihyperlipidemic treatment includes statins. Risk factors for coronary artery disease include diabetes mellitus, dyslipidemia, hypertension and male sex.  CAD with  stent placement Had stents put in 10 years ago- Had follow with cardiologist earlier this year - stress test last year was normal   Review of Systems  Constitutional: Negative.   HENT: Negative.   Respiratory: Negative for shortness of breath.   Cardiovascular: Negative for chest pain and palpitations.  Endocrine: Negative for polydipsia, polyphagia and polyuria.  Musculoskeletal: Negative for myalgias.  Neurological: Negative for weakness and headaches.  All other systems reviewed and are negative.      Objective:   Physical Exam  Constitutional: He is oriented to person, place, and time. He appears well-developed and well-nourished.  HENT:  Head: Normocephalic.  Right Ear: External ear normal.  Left Ear: External ear normal.  Nose: Nose normal.  Mouth/Throat: Oropharynx is clear and moist.  Eyes: EOM are normal. Pupils are equal, round, and reactive to light.  Neck: Normal range of motion. Neck supple. No JVD present. No thyromegaly present.  Cardiovascular: Normal rate, regular rhythm, normal heart sounds and intact distal pulses.  Exam reveals no gallop and no friction rub.   No murmur heard. Pulmonary/Chest: Effort normal and breath sounds normal. No respiratory distress. He has no wheezes. He has no rales. He exhibits no tenderness.  Abdominal: Soft. Bowel sounds are normal. He exhibits no mass. There is no tenderness.  Musculoskeletal: Normal range of motion. He exhibits no edema.  Lymphadenopathy:    He has no cervical adenopathy.  Neurological: He is alert and oriented to person, place, and time. No cranial nerve deficit.  Skin: Skin is warm and dry.  Psychiatric: He has a normal mood and affect. His behavior is normal. Judgment and thought content normal.   BP 118/68 mmHg  Pulse 68  Temp(Src) 96.9 F (36.1 C) (Oral)  Ht 5' 10"  (  1.778 m)  Wt 197 lb (89.359 kg)  BMI 28.27 kg/m2  Results for orders placed or performed in visit on 06/25/14  POCT glycosylated  hemoglobin (Hb A1C)  Result Value Ref Range   Hemoglobin A1C 7.1%           Assessment & Plan:   1. Hyperlipidemia with target LDL less than 100 Low fat diet - NMR, lipoprofile  2. Essential hypertension, benign Do not add salt to diet - CMP14+EGFR  3. Type 2 diabetes mellitus without complication Stricter carb counting - POCT glycosylated hemoglobin (Hb A1C)    Labs pending Health maintenance reviewed Diet and exercise encouraged Continue all meds Follow up  In 3 month   [place], FNP

## 2014-06-25 NOTE — Patient Instructions (Signed)

## 2014-06-26 LAB — CMP14+EGFR
ALBUMIN: 4 g/dL (ref 3.6–4.8)
ALT: 26 IU/L (ref 0–44)
AST: 18 IU/L (ref 0–40)
Albumin/Globulin Ratio: 1.5 (ref 1.1–2.5)
Alkaline Phosphatase: 67 IU/L (ref 39–117)
BILIRUBIN TOTAL: 0.5 mg/dL (ref 0.0–1.2)
BUN / CREAT RATIO: 16 (ref 10–22)
BUN: 17 mg/dL (ref 8–27)
CHLORIDE: 99 mmol/L (ref 97–108)
CO2: 24 mmol/L (ref 18–29)
Calcium: 9.4 mg/dL (ref 8.6–10.2)
Creatinine, Ser: 1.06 mg/dL (ref 0.76–1.27)
GFR calc non Af Amer: 75 mL/min/{1.73_m2} (ref >59)
GFR, EST AFRICAN AMERICAN: 87 mL/min/{1.73_m2} (ref >59)
Globulin, Total: 2.7 g/dL (ref 1.5–4.5)
Glucose: 177 mg/dL — ABNORMAL HIGH (ref 65–99)
Potassium: 4.4 mmol/L (ref 3.5–5.2)
SODIUM: 138 mmol/L (ref 134–144)
Total Protein: 6.7 g/dL (ref 6.0–8.5)

## 2014-06-26 LAB — NMR, LIPOPROFILE
CHOLESTEROL: 118 mg/dL (ref 100–199)
HDL CHOLESTEROL BY NMR: 41 mg/dL (ref >39)
HDL PARTICLE NUMBER: 29.6 umol/L — AB (ref >=30.5)
LDL Particle Number: 616 nmol/L (ref <1000)
LDL Size: 20.5 nm (ref >20.5)
LDL-C: 46 mg/dL (ref 0–99)
LP-IR Score: 61 — ABNORMAL HIGH (ref <=45)
Small LDL Particle Number: 290 nmol/L (ref <=527)
TRIGLYCERIDES BY NMR: 154 mg/dL — AB (ref 0–149)

## 2014-06-27 ENCOUNTER — Ambulatory Visit: Payer: BC Managed Care – PPO | Admitting: Nurse Practitioner

## 2014-07-23 ENCOUNTER — Encounter: Payer: Self-pay | Admitting: *Deleted

## 2014-08-28 ENCOUNTER — Other Ambulatory Visit: Payer: Self-pay

## 2014-08-28 DIAGNOSIS — E785 Hyperlipidemia, unspecified: Secondary | ICD-10-CM

## 2014-08-28 MED ORDER — ROSUVASTATIN CALCIUM 10 MG PO TABS: 10.0000 mg | ORAL_TABLET | Freq: Every day | ORAL | Status: DC

## 2014-09-03 ENCOUNTER — Other Ambulatory Visit: Payer: Self-pay | Admitting: *Deleted

## 2014-09-03 DIAGNOSIS — E785 Hyperlipidemia, unspecified: Secondary | ICD-10-CM

## 2014-09-03 MED ORDER — ROSUVASTATIN CALCIUM 10 MG PO TABS: 10.0000 mg | ORAL_TABLET | Freq: Every day | ORAL | Status: DC

## 2014-09-09 ENCOUNTER — Ambulatory Visit (INDEPENDENT_AMBULATORY_CARE_PROVIDER_SITE_OTHER): Payer: BLUE CROSS/BLUE SHIELD | Admitting: Nurse Practitioner

## 2014-09-09 ENCOUNTER — Encounter: Payer: Self-pay | Admitting: Nurse Practitioner

## 2014-09-09 VITALS — BP 126/77 | HR 67 | Temp 97.5°F | Ht 70.0 in | Wt 195.0 lb

## 2014-09-09 DIAGNOSIS — H6123 Impacted cerumen, bilateral: Secondary | ICD-10-CM | POA: Diagnosis not present

## 2014-09-09 DIAGNOSIS — J0100 Acute maxillary sinusitis, unspecified: Secondary | ICD-10-CM

## 2014-09-09 NOTE — Progress Notes (Signed)
   Subjective:    Patient ID: Arthur Torres, male    DOB: 1951-01-26, 64 y.o.   MRN: [id number]  HPI Patient in c/o right facial ain and ear pain- Went to dentist last friday and thought may be getting tooth abscess- was given keflex- patient says that ear has continued to hurt- here today to have ear looked at. Right upper teeth hurt when he chooses.    Review of Systems  Constitutional: Negative.   HENT: Positive for ear pain. Negative for congestion.   Respiratory: Negative.   Cardiovascular: Negative.   Gastrointestinal: Negative.   Genitourinary: Negative.   Neurological: Negative.   Psychiatric/Behavioral: Negative.   All other systems reviewed and are negative.      Objective:   Physical Exam  Constitutional: He is oriented to person, place, and time. He appears well-developed and well-nourished.  HENT:  Right Ear: External ear normal.  Left Ear: External ear normal.  bil cerumen impaction S/p ear irrigation= TM's clear bil  Cardiovascular: Normal rate, regular rhythm and normal heart sounds.   Pulmonary/Chest: Effort normal and breath sounds normal.  Neurological: He is alert and oriented to person, place, and time.  Skin: Skin is warm and dry.  Psychiatric: He has a normal mood and affect. His behavior is normal. Judgment and thought content normal.    BP 126/77 mmHg  Pulse 67  Temp(Src) 97.5 F (36.4 C) (Oral)  Ht 5\' 10"  (1.778 m)  Wt 195 lb (88.451 kg)  BMI 27.98 kg/m2       Assessment & Plan:   1. Cerumen impaction, bilateral   2. Acute maxillary sinusitis, recurrence not specified    1. Take meds as prescribed 2. Use a cool mist humidifier especially during the winter months and when heat has been humid. 3. Use saline nose sprays frequently 4. Saline irrigations of the nose can be very helpful if done frequently.  * 4X daily for 1 week*  * Use of a nettie pot can be helpful with this. Follow directions with this* 5. Drink plenty of fluids 6.  Keep thermostat turn down low 7.For any cough or congestion  Use plain Mucinex- regular strength or max strength is fine   * Children- consult with Pharmacist for dosing 8. For fever or aces or pains- take tylenol or ibuprofen appropriate for age and weight.  * for fevers greater than 101 orally you may alternate ibuprofen and tylenol every  3 hours.   Continue keflex rx by dentist RTO prn  [name] Done, FNP

## 2014-09-09 NOTE — Patient Instructions (Signed)
Cerumen Impaction °A cerumen impaction is when the wax in your ear forms a plug. This plug usually causes reduced hearing. Sometimes it also causes an earache or dizziness. Removing a cerumen impaction can be difficult and painful. The wax sticks to the ear canal. The canal is sensitive and bleeds easily. If you try to remove a heavy wax buildup with a cotton tipped swab, you may push it in further. °Irrigation with water, suction, and small ear curettes may be used to clear out the wax. If the impaction is fixed to the skin in the ear canal, ear drops may be needed for a few days to loosen the wax. People who build up a lot of wax frequently can use ear wax removal products available in your local drugstore. °SEEK MEDICAL CARE IF:  °You develop an earache, increased hearing loss, or marked dizziness. °Document Released: 07/22/2004 Document Revised: 09/06/2011 Document Reviewed: 09/11/2009 °[employer]® Patient Information ©2015 [employer]. This information is not intended to replace advice given to you by your health care provider. Make sure you discuss any questions you have with your health care provider. ° °

## 2014-10-04 ENCOUNTER — Ambulatory Visit (INDEPENDENT_AMBULATORY_CARE_PROVIDER_SITE_OTHER): Payer: BLUE CROSS/BLUE SHIELD

## 2014-10-04 ENCOUNTER — Ambulatory Visit (INDEPENDENT_AMBULATORY_CARE_PROVIDER_SITE_OTHER): Payer: BLUE CROSS/BLUE SHIELD | Admitting: Nurse Practitioner

## 2014-10-04 ENCOUNTER — Encounter: Payer: Self-pay | Admitting: Nurse Practitioner

## 2014-10-04 VITALS — BP 118/74 | HR 58 | Temp 97.0°F | Ht 70.0 in | Wt 197.0 lb

## 2014-10-04 DIAGNOSIS — E119 Type 2 diabetes mellitus without complications: Secondary | ICD-10-CM

## 2014-10-04 DIAGNOSIS — Z8546 Personal history of malignant neoplasm of prostate: Secondary | ICD-10-CM

## 2014-10-04 DIAGNOSIS — Z955 Presence of coronary angioplasty implant and graft: Secondary | ICD-10-CM

## 2014-10-04 DIAGNOSIS — E785 Hyperlipidemia, unspecified: Secondary | ICD-10-CM | POA: Diagnosis not present

## 2014-10-04 DIAGNOSIS — I10 Essential (primary) hypertension: Secondary | ICD-10-CM | POA: Diagnosis not present

## 2014-10-04 LAB — POCT GLYCOSYLATED HEMOGLOBIN (HGB A1C): Hemoglobin A1C: 7

## 2014-10-04 MED ORDER — METFORMIN HCL ER 500 MG PO TB24: 500.0000 mg | ORAL_TABLET | Freq: Every day | ORAL | Status: DC

## 2014-10-04 MED ORDER — ROSUVASTATIN CALCIUM 10 MG PO TABS: 10.0000 mg | ORAL_TABLET | Freq: Every day | ORAL | Status: DC

## 2014-10-04 MED ORDER — METOPROLOL SUCCINATE ER 50 MG PO TB24: ORAL_TABLET | ORAL | Status: DC

## 2014-10-04 NOTE — Progress Notes (Signed)
Subjective:    Patient ID: Arthur Torres, male    DOB: 09/09/1950, 64 y.o.   MRN: [id number]  Patient in today for follow up of chronic medical problems. The patient states he has had 1-2 episodes in the last 3 months where he has a headache in the early morning and feeling mildly dizzy when first getting out of bed. He states this occurred this morning. He states he does not check his BP or blood sugars.   Diabetes He presents for his follow-up diabetic visit. He has type 2 diabetes mellitus. No MedicAlert identification noted. His disease course has been fluctuating. Hypoglycemia symptoms include headaches (Mild early AM achy generalized headaches. ). Pertinent negatives for hypoglycemia include no dizziness. Associated symptoms include polyphagia. Pertinent negatives for diabetes include no chest pain, no polydipsia, no polyuria, no weakness and no weight loss. Risk factors for coronary artery disease include diabetes mellitus, family history, dyslipidemia, hypertension and male sex. Current diabetic treatment includes oral agent (monotherapy). His weight is stable. When asked about meal planning, he reported none. He has not had a previous visit with a dietitian. He rarely participates in exercise. His home blood glucose trend is fluctuating minimally. His breakfast blood glucose is taken between 8-9 am. His breakfast blood glucose range is generally 110-130 mg/dl. His overall blood glucose range is 110-130 mg/dl. An ACE inhibitor/angiotensin II receptor blocker is not being taken. He does not see a podiatrist.Eye exam is not current.  Hypertension This is a chronic problem. The current episode started more than 1 year ago. The problem is uncontrolled. Associated symptoms include headaches (Mild early AM achy generalized headaches. ). Pertinent negatives include no chest pain, palpitations or shortness of breath. Risk factors for coronary artery disease include dyslipidemia, diabetes mellitus and male  gender. Past treatments include beta blockers. Hypertensive end-organ damage includes CAD/MI.  Hyperlipidemia This is a chronic problem. The current episode started more than 1 year ago. The problem is uncontrolled. Recent lipid tests were reviewed and are high. Exacerbating diseases include diabetes. He has no history of hypothyroidism or obesity. Pertinent negatives include no chest pain, myalgias or shortness of breath. Current antihyperlipidemic treatment includes statins. Risk factors for coronary artery disease include diabetes mellitus, dyslipidemia, hypertension and male sex.  CAD with stent placement Had stents put in 10 years ago- Had follow with cardiologist earlier this year - stress test last year was normal   Review of Systems  Constitutional: Positive for appetite change. Negative for weight loss, activity change and unexpected weight change.  Eyes: Negative for visual disturbance.  Respiratory: Negative for cough, chest tightness and shortness of breath.   Cardiovascular: Negative for chest pain, palpitations and leg swelling.  Gastrointestinal: Negative for nausea and constipation.  Endocrine: Positive for polyphagia. Negative for polydipsia and polyuria.  Genitourinary: Negative for decreased urine volume and difficulty urinating.  Musculoskeletal: Negative for myalgias.  Neurological: Positive for light-headedness (Mild, lasting 5-6 hours, not relieved by meals. ) and headaches (Mild early AM achy generalized headaches. ). Negative for dizziness and weakness.  All other systems reviewed and are negative.      Objective:   Physical Exam  Constitutional: He is oriented to person, place, and time. He appears well-developed and well-nourished.  HENT:  Head: Normocephalic.  Right Ear: External ear normal.  Left Ear: External ear normal.  Nose: Nose normal.  Mouth/Throat: Oropharynx is clear and moist.  Eyes: EOM are normal. Pupils are equal, round, and reactive to light.   Neck: Normal  range of motion. Neck supple. No JVD present. No thyromegaly present.  Cardiovascular: Normal rate, regular rhythm, normal heart sounds and intact distal pulses.  Exam reveals no gallop and no friction rub.   No murmur heard. Pulmonary/Chest: Effort normal and breath sounds normal. No respiratory distress. He has no wheezes. He has no rales. He exhibits no tenderness.  Abdominal: Soft. Bowel sounds are normal. He exhibits no mass. There is no tenderness.  Musculoskeletal: Normal range of motion. He exhibits no edema.  Lymphadenopathy:    He has no cervical adenopathy.  Neurological: He is alert and oriented to person, place, and time. No cranial nerve deficit.  Skin: Skin is warm and dry.  Psychiatric: He has a normal mood and affect. His behavior is normal. Judgment and thought content normal.   BP 118/74 mmHg  Pulse 58  Temp(Src) 97 F (36.1 C) (Oral)  Ht 5' 10"  (1.778 m)  Wt 197 lb (89.359 kg)  BMI 28.27 kg/m2  Results for orders placed or performed in visit on 10/04/14  POCT glycosylated hemoglobin (Hb A1C)  Result Value Ref Range   Hemoglobin A1C 7.0     EKG- sinus bradycardia-[name] Done, FNP  Chest x ray- negative-Preliminary reading by Ronnald Collum, FNP  [facility]        Assessment & Plan:   1. Hyperlipidemia with target LDL less than 100 Low fat diet - NMR, lipoprofile  2. Essential hypertension, benign Do not add salt to diet - CMP14+EGFR  3. Type 2 diabetes mellitus without complication Stricter carb counting - POCT glycosylated hemoglobin (Hb A1C)   Meds ordered this encounter  Medications  . metoprolol succinate (TOPROL-XL) 50 MG 24 hr tablet    Sig: 1 PO QD    Dispense:  90 tablet    Refill:  1    Order Specific Question:  Supervising Provider    Answer:  Chipper Herb [[id number]]  . rosuvastatin (CRESTOR) 10 MG tablet    Sig: Take 1 tablet (10 mg total) by mouth daily.    Dispense:  90 tablet    Refill:  1    Order Specific  Question:  Supervising Provider    Answer:  Chipper Herb [[id number]]  . metFORMIN (GLUCOPHAGE-XR) 500 MG 24 hr tablet    Sig: Take 1 tablet (500 mg total) by mouth daily with breakfast.    Dispense:  90 tablet    Refill:  1    Order Specific Question:  Supervising Provider    Answer:  Chipper Herb [[id number]]     Labs pending Health maintenance reviewed Diet and exercise encouraged Continue all meds Follow up  In 3 month   [place], FNP

## 2014-10-04 NOTE — Patient Instructions (Signed)
Fat and Cholesterol Control Diet Fat and cholesterol levels in your blood and organs are influenced by your diet. High levels of fat and cholesterol may lead to diseases of the heart, small and large blood vessels, gallbladder, liver, and pancreas. CONTROLLING FAT AND CHOLESTEROL WITH DIET Although exercise and lifestyle factors are important, your diet is key. That is because certain foods are known to raise cholesterol and others to lower it. The goal is to balance foods for their effect on cholesterol and more importantly, to replace saturated and trans fat with other types of fat, such as monounsaturated fat, polyunsaturated fat, and omega-3 fatty acids. On average, a person should consume no more than 15 to 17 g of saturated fat daily. Saturated and trans fats are considered "bad" fats, and they will raise LDL cholesterol. Saturated fats are primarily found in animal products such as meats, butter, and cream. However, that does not mean you need to give up all your favorite foods. Today, there are good tasting, low-fat, low-cholesterol substitutes for most of the things you like to eat. Choose low-fat or nonfat alternatives. Choose round or loin cuts of red meat. These types of cuts are lowest in fat and cholesterol. Chicken (without the skin), fish, veal, and ground turkey breast are great choices. Eliminate fatty meats, such as hot dogs and salami. Even shellfish have little or no saturated fat. Have a 3 oz (85 g) portion when you eat lean meat, poultry, or fish. Trans fats are also called "partially hydrogenated oils." They are oils that have been scientifically manipulated so that they are solid at room temperature resulting in a longer shelf life and improved taste and texture of foods in which they are added. Trans fats are found in stick margarine, some tub margarines, cookies, crackers, and baked goods.  When baking and cooking, oils are a great substitute for butter. The monounsaturated oils are  especially beneficial since it is believed they lower LDL and raise HDL. The oils you should avoid entirely are saturated tropical oils, such as coconut and palm.  Remember to eat a lot from food groups that are naturally free of saturated and trans fat, including fish, fruit, vegetables, beans, grains (barley, rice, couscous, bulgur wheat), and pasta (without cream sauces).  IDENTIFYING FOODS THAT LOWER FAT AND CHOLESTEROL  Soluble fiber may lower your cholesterol. This type of fiber is found in fruits such as apples, vegetables such as broccoli, potatoes, and carrots, legumes such as beans, peas, and lentils, and grains such as barley. Foods fortified with plant sterols (phytosterol) may also lower cholesterol. You should eat at least 2 g per day of these foods for a cholesterol lowering effect.  Read package labels to identify low-saturated fats, trans fat free, and low-fat foods at the supermarket. Select cheeses that have only 2 to 3 g saturated fat per ounce. Use a heart-healthy tub margarine that is free of trans fats or partially hydrogenated oil. When buying baked goods (cookies, crackers), avoid partially hydrogenated oils. Breads and muffins should be made from whole grains (whole-wheat or whole oat flour, instead of "flour" or "enriched flour"). Buy non-creamy canned soups with reduced salt and no added fats.  FOOD PREPARATION TECHNIQUES  Never deep-fry. If you must fry, either stir-fry, which uses very little fat, or use non-stick cooking sprays. When possible, broil, bake, or roast meats, and steam vegetables. Instead of putting butter or margarine on vegetables, use lemon and herbs, applesauce, and cinnamon (for squash and sweet potatoes). Use nonfat   yogurt, salsa, and low-fat dressings for salads.  LOW-SATURATED FAT / LOW-FAT FOOD SUBSTITUTES Meats / Saturated Fat (g)  Avoid: Steak, marbled (3 oz/85 g) / 11 g  Choose: Steak, lean (3 oz/85 g) / 4 g  Avoid: Hamburger (3 oz/85 g) / 7  g  Choose: Hamburger, lean (3 oz/85 g) / 5 g  Avoid: Ham (3 oz/85 g) / 6 g  Choose: Ham, lean cut (3 oz/85 g) / 2.4 g  Avoid: Chicken, with skin, dark meat (3 oz/85 g) / 4 g  Choose: Chicken, skin removed, dark meat (3 oz/85 g) / 2 g  Avoid: Chicken, with skin, light meat (3 oz/85 g) / 2.5 g  Choose: Chicken, skin removed, light meat (3 oz/85 g) / 1 g Dairy / Saturated Fat (g)  Avoid: Whole milk (1 cup) / 5 g  Choose: Low-fat milk, 2% (1 cup) / 3 g  Choose: Low-fat milk, 1% (1 cup) / 1.5 g  Choose: Skim milk (1 cup) / 0.3 g  Avoid: Hard cheese (1 oz/28 g) / 6 g  Choose: Skim milk cheese (1 oz/28 g) / 2 to 3 g  Avoid: Cottage cheese, 4% fat (1 cup) / 6.5 g  Choose: Low-fat cottage cheese, 1% fat (1 cup) / 1.5 g  Avoid: Ice cream (1 cup) / 9 g  Choose: Sherbet (1 cup) / 2.5 g  Choose: Nonfat frozen yogurt (1 cup) / 0.3 g  Choose: Frozen fruit bar / trace  Avoid: Whipped cream (1 tbs) / 3.5 g  Choose: Nondairy whipped topping (1 tbs) / 1 g Condiments / Saturated Fat (g)  Avoid: Mayonnaise (1 tbs) / 2 g  Choose: Low-fat mayonnaise (1 tbs) / 1 g  Avoid: Butter (1 tbs) / 7 g  Choose: Extra light margarine (1 tbs) / 1 g  Avoid: Coconut oil (1 tbs) / 11.8 g  Choose: Olive oil (1 tbs) / 1.8 g  Choose: Corn oil (1 tbs) / 1.7 g  Choose: Safflower oil (1 tbs) / 1.2 g  Choose: Sunflower oil (1 tbs) / 1.4 g  Choose: Soybean oil (1 tbs) / 2.4 g  Choose: Canola oil (1 tbs) / 1 g Document Released: 06/14/2005 Document Revised: 10/09/2012 Document Reviewed: 09/12/2013 [employer] Patient Information 2015 [employer]. This information is not intended to replace advice given to you by your health care provider. Make sure you discuss any questions you have with your health care provider.  

## 2014-10-05 LAB — CMP14+EGFR
A/G RATIO: 1.8 (ref 1.1–2.5)
ALBUMIN: 4.4 g/dL (ref 3.6–4.8)
ALT: 29 IU/L (ref 0–44)
AST: 19 IU/L (ref 0–40)
Alkaline Phosphatase: 66 IU/L (ref 39–117)
BUN / CREAT RATIO: 22 (ref 10–22)
BUN: 24 mg/dL (ref 8–27)
Bilirubin Total: 0.4 mg/dL (ref 0.0–1.2)
CO2: 23 mmol/L (ref 18–29)
Calcium: 9.4 mg/dL (ref 8.6–10.2)
Chloride: 99 mmol/L (ref 97–108)
Creatinine, Ser: 1.08 mg/dL (ref 0.76–1.27)
GFR calc non Af Amer: 74 mL/min/{1.73_m2} (ref >59)
GFR, EST AFRICAN AMERICAN: 85 mL/min/{1.73_m2} (ref >59)
Globulin, Total: 2.4 g/dL (ref 1.5–4.5)
Glucose: 169 mg/dL — ABNORMAL HIGH (ref 65–99)
Potassium: 4.4 mmol/L (ref 3.5–5.2)
Sodium: 138 mmol/L (ref 134–144)
Total Protein: 6.8 g/dL (ref 6.0–8.5)

## 2014-10-05 LAB — NMR, LIPOPROFILE
Cholesterol: 101 mg/dL (ref 100–199)
HDL Cholesterol by NMR: 39 mg/dL — ABNORMAL LOW (ref >39)
HDL Particle Number: 32.2 umol/L (ref >=30.5)
LDL Particle Number: 468 nmol/L (ref <1000)
LDL Size: 20.6 nm (ref >20.5)
LDL-C: 46 mg/dL (ref 0–99)
LP-IR Score: 45 (ref <=45)
Small LDL Particle Number: 135 nmol/L (ref <=527)
TRIGLYCERIDES BY NMR: 81 mg/dL (ref 0–149)

## 2014-10-21 ENCOUNTER — Ambulatory Visit (INDEPENDENT_AMBULATORY_CARE_PROVIDER_SITE_OTHER): Payer: BLUE CROSS/BLUE SHIELD

## 2014-10-21 ENCOUNTER — Encounter: Payer: Self-pay | Admitting: Family Medicine

## 2014-10-21 ENCOUNTER — Ambulatory Visit (INDEPENDENT_AMBULATORY_CARE_PROVIDER_SITE_OTHER): Payer: BLUE CROSS/BLUE SHIELD | Admitting: Family Medicine

## 2014-10-21 ENCOUNTER — Ambulatory Visit (HOSPITAL_COMMUNITY)
Admission: RE | Admit: 2014-10-21 | Discharge: 2014-10-21 | Disposition: A | Discharge status: Home / Self Care | Payer: BLUE CROSS/BLUE SHIELD | Source: Ambulatory Visit | Attending: Family Medicine | Admitting: Family Medicine

## 2014-10-21 VITALS — BP 120/74 | HR 63 | Temp 97.0°F | Ht 70.0 in | Wt 197.0 lb

## 2014-10-21 DIAGNOSIS — R319 Hematuria, unspecified: Secondary | ICD-10-CM | POA: Diagnosis not present

## 2014-10-21 DIAGNOSIS — M545 Low back pain, unspecified: Secondary | ICD-10-CM

## 2014-10-21 DIAGNOSIS — I2510 Atherosclerotic heart disease of native coronary artery without angina pectoris: Secondary | ICD-10-CM

## 2014-10-21 DIAGNOSIS — E119 Type 2 diabetes mellitus without complications: Secondary | ICD-10-CM | POA: Diagnosis not present

## 2014-10-21 LAB — POCT UA - MICROSCOPIC ONLY
BACTERIA, U MICROSCOPIC: NEGATIVE
CASTS, UR, LPF, POC: NEGATIVE
Crystals, Ur, HPF, POC: NEGATIVE
Mucus, UA: NEGATIVE
WBC, UR, HPF, POC: NEGATIVE
YEAST UA: NEGATIVE

## 2014-10-21 LAB — POCT URINALYSIS DIPSTICK
Bilirubin, UA: NEGATIVE
GLUCOSE UA: NEGATIVE
Ketones, UA: NEGATIVE
Leukocytes, UA: NEGATIVE
Nitrite, UA: NEGATIVE
PROTEIN UA: NEGATIVE
Spec Grav, UA: 1.02
UROBILINOGEN UA: 2
pH, UA: 6

## 2014-10-21 MED ORDER — TRAMADOL HCL 50 MG PO TABS: 50.0000 mg | ORAL_TABLET | Freq: Three times a day (TID) | ORAL | Status: DC | PRN

## 2014-10-21 NOTE — Progress Notes (Signed)
Subjective:    Patient ID: Arthur Torres, male    DOB: 01/26/1951, 64 y.o.   MRN: [id number]  HPI  Patient comes in today complaining with left lower back pain, left arm numbness and dizziness. This patient has a history of ASCVD and diabetes. There is a family history of kidney stones but to the patient's knowledge he has never had one.  Review of Systems  Constitutional: Negative.   HENT: Negative.   Eyes: Negative.   Respiratory: Negative.   Cardiovascular: Negative.   Gastrointestinal: Negative.   Endocrine: Negative.   Genitourinary: Negative.   Musculoskeletal: Positive for back pain.  Skin: Negative.   Allergic/Immunologic: Negative.   Neurological: Positive for dizziness and numbness.  Hematological: Negative.   Psychiatric/Behavioral: Negative.     Patient Active Problem List   Diagnosis Date Noted  . H/O prostate cancer 10/04/2014  . History of right coronary artery stent placement 10/06/2012  . Hyperlipidemia with target LDL less than 100 10/03/2012  . Essential hypertension, benign 10/03/2012  . Diabetes 10/03/2012   Outpatient Encounter Prescriptions as of 10/21/2014  Medication Sig  . aspirin 325 MG EC tablet Take 325 mg by mouth daily.  . metFORMIN (GLUCOPHAGE-XR) 500 MG 24 hr tablet Take 1 tablet (500 mg total) by mouth daily with breakfast.  . metoprolol succinate (TOPROL-XL) 50 MG 24 hr tablet 1 PO QD  . rosuvastatin (CRESTOR) 10 MG tablet Take 1 tablet (10 mg total) by mouth daily.       Objective:   Physical Exam  Constitutional: He is oriented to person, place, and time. He appears well-developed and well-nourished. No distress.  HENT:  Head: Normocephalic and atraumatic.  Eyes: Conjunctivae and EOM are normal. Pupils are equal, round, and reactive to light. Right eye exhibits no discharge. Left eye exhibits no discharge. No scleral icterus.  Neck: Normal range of motion. Neck supple. No thyromegaly present.  Cardiovascular: Normal rate, regular  rhythm and normal heart sounds.   No murmur heard. The rhythm was regular at 72/m  Pulmonary/Chest: Effort normal and breath sounds normal. No respiratory distress. He has no wheezes. He has no rales. He exhibits no tenderness.  Clear anteriorly and posteriorly  Abdominal: Soft. Bowel sounds are normal. He exhibits no mass. There is tenderness. There is no rebound and no guarding.  Slight epigastric and left upper quadrant tenderness with normal bowel sounds  Musculoskeletal: Normal range of motion. He exhibits no edema.  Lymphadenopathy:    He has no cervical adenopathy.  Neurological: He is alert and oriented to person, place, and time.  Leg raising and hip abduction are good bilaterally without reproduction of  Skin: Skin is warm and dry. No rash noted. No erythema. No pallor.  Psychiatric: He has a normal mood and affect. His behavior is normal. Judgment and thought content normal.  Nursing note and vitals reviewed.  BP 120/74 mmHg  Pulse 63  Temp(Src) 97 F (36.1 C) (Oral)  Ht 5\' 10"  (1.778 m)  Wt 197 lb (89.359 kg)  BMI 28.27 kg/m2 [facility] reading (PRIMARY) by  Dr. Bobbe Medico spine, degenerative changes                                  Results for orders placed or performed in visit on 10/21/14  POCT urinalysis dipstick  Result Value Ref Range   Color, UA gold    Clarity, UA clear    Glucose, UA  neg    Bilirubin, UA neg    Ketones, UA neg    Spec Grav, UA 1.020    Blood, UA large    pH, UA 6.0    Protein, UA neg    Urobilinogen, UA 2.0    Nitrite, UA neg    Leukocytes, UA Negative   POCT UA - Microscopic Only  Result Value Ref Range   WBC, Ur, HPF, POC neg    RBC, urine, microscopic 10-15    Bacteria, U Microscopic neg    Mucus, UA neg    Epithelial cells, urine per micros few    Crystals, Ur, HPF, POC neg    Casts, Ur, LPF, POC neg    Yeast, UA neg    EKG:  No change on EKG from one done previously. History of old anterior septal infarct. No change when compared  to EKG done on April 8 of this year.        Assessment & Plan:  1. Left-sided low back pain without sciatica -The urinalysis did have red blood cells present with no infection - POCT urinalysis dipstick - POCT UA - Microscopic Only - DG Lumbar Spine 2-3 Views; Future - CT Abdomen Pelvis Wo Contrast; Future  2. Hematuria -Because of the red blood cells present in the urine and the severity of the pain the patient experienced with nausea CT scan will be - CT Abdomen Pelvis Wo Contrast; Future  3. Type 2 diabetes mellitus without complication  4. ASCVD (arteriosclerotic cardiovascular disease) -The EKG that was done today was no different from the one that was done several days ago in this office. - CT Abdomen Pelvis Wo Contrast; Future  Meds ordered this encounter  Medications  . DISCONTD: traMADol (ULTRAM) 50 MG tablet    Sig: Take 1 tablet (50 mg total) by mouth every 8 (eight) hours as needed.    Dispense:  28 tablet    Refill:  0  . traMADol (ULTRAM) 50 MG tablet    Sig: Take 1 tablet (50 mg total) by mouth every 8 (eight) hours as needed.    Dispense:  28 tablet    Refill:  0   Patient Instructions  Drink plenty of fluids We will arrange for you to have a CT scan of the abdomen to further evaluate the possibility of a kidney stone because of the blood in the urine Because it has been over 2 years and she is seen the cardiologist we will also arrange for a visit with the cardiologist for follow-up    Arrie Senate MD

## 2014-10-21 NOTE — Patient Instructions (Signed)
Drink plenty of fluids We will arrange for you to have a CT scan of the abdomen to further evaluate the possibility of a kidney stone because of the blood in the urine Because it has been over 2 years and she is seen the cardiologist we will also arrange for a visit with the cardiologist for follow-up

## 2014-10-24 ENCOUNTER — Other Ambulatory Visit: Payer: Self-pay | Admitting: *Deleted

## 2014-10-24 ENCOUNTER — Other Ambulatory Visit (INDEPENDENT_AMBULATORY_CARE_PROVIDER_SITE_OTHER): Payer: BLUE CROSS/BLUE SHIELD

## 2014-10-24 DIAGNOSIS — R319 Hematuria, unspecified: Secondary | ICD-10-CM

## 2014-10-24 LAB — POCT URINALYSIS DIPSTICK
BILIRUBIN UA: NEGATIVE
Glucose, UA: NEGATIVE
Ketones, UA: NEGATIVE
LEUKOCYTES UA: NEGATIVE
Nitrite, UA: NEGATIVE
PH UA: 5
Protein, UA: NEGATIVE
Spec Grav, UA: 1.02
Urobilinogen, UA: NEGATIVE

## 2014-10-24 LAB — POCT UA - MICROSCOPIC ONLY
Bacteria, U Microscopic: NEGATIVE
Casts, Ur, LPF, POC: NEGATIVE
Crystals, Ur, HPF, POC: NEGATIVE
Mucus, UA: NEGATIVE
WBC, Ur, HPF, POC: NEGATIVE
Yeast, UA: NEGATIVE

## 2014-10-24 NOTE — Progress Notes (Signed)
Lab only 

## 2014-10-28 ENCOUNTER — Ambulatory Visit: Payer: BLUE CROSS/BLUE SHIELD | Admitting: Family Medicine

## 2014-10-28 ENCOUNTER — Ambulatory Visit (INDEPENDENT_AMBULATORY_CARE_PROVIDER_SITE_OTHER): Payer: BLUE CROSS/BLUE SHIELD | Admitting: Family Medicine

## 2014-10-28 ENCOUNTER — Encounter: Payer: Self-pay | Admitting: Family Medicine

## 2014-10-28 VITALS — BP 103/64 | HR 62 | Temp 97.2°F | Ht 70.0 in | Wt 195.0 lb

## 2014-10-28 DIAGNOSIS — M545 Low back pain, unspecified: Secondary | ICD-10-CM

## 2014-10-28 DIAGNOSIS — R319 Hematuria, unspecified: Secondary | ICD-10-CM | POA: Diagnosis not present

## 2014-10-28 LAB — POCT URINALYSIS DIPSTICK
Bilirubin, UA: NEGATIVE
Blood, UA: NEGATIVE
GLUCOSE UA: NEGATIVE
Ketones, UA: NEGATIVE
LEUKOCYTES UA: NEGATIVE
Nitrite, UA: NEGATIVE
Protein, UA: NEGATIVE
Spec Grav, UA: 1.025
UROBILINOGEN UA: NEGATIVE
pH, UA: 5

## 2014-10-28 LAB — POCT UA - MICROSCOPIC ONLY
Bacteria, U Microscopic: NEGATIVE
CASTS, UR, LPF, POC: NEGATIVE
Crystals, Ur, HPF, POC: NEGATIVE
Mucus, UA: NEGATIVE
WBC, UR, HPF, POC: NEGATIVE
Yeast, UA: NEGATIVE

## 2014-10-28 NOTE — Patient Instructions (Addendum)
The patient should continue to drink plenty of fluids If he has any more episodes of pain he should get back in touch with [place] He should take a copy of the x-ray reports as well as the CT scan report with him to see the urologist when he is called and given an appointment to see them Continue with cholesterol medicine and following aggressive therapeutic lifestyle

## 2014-10-28 NOTE — Progress Notes (Signed)
Subjective:    Patient ID: Arthur Torres, male    DOB: 1950/12/07, 64 y.o.   MRN: [id number]  HPI Patient here today for 1 week follow up on left lower back pain and hematuria. A referral was placed for urology, but there is no appt set at this time. He states that he feels better and there is no longer any discomfort. He is also not noticed any blood in the urine. All of his recent x-rays and lab reports were reviewed with him in the office today and he was given copies of these to take with him to his urology visit once this is scheduled.        Patient Active Problem List   Diagnosis Date Noted  . H/O prostate cancer 10/04/2014  . History of right coronary artery stent placement 10/06/2012  . Hyperlipidemia with target LDL less than 100 10/03/2012  . Essential hypertension, benign 10/03/2012  . Diabetes 10/03/2012   Outpatient Encounter Prescriptions as of 10/28/2014  . Order #: [id number] Class: Historical Med  . Order #: [id number] Class: Normal  . Order #: [id number] Class: Normal  . Order #: [id number] Class: Normal  . Order #: [id number] Class: Print  ' Review of Systems  Constitutional: Negative.   HENT: Negative.   Eyes: Negative.   Respiratory: Negative.   Cardiovascular: Negative.   Gastrointestinal: Negative.   Endocrine: Negative.   Genitourinary: Negative.   Musculoskeletal: Negative.   Skin: Negative.   Allergic/Immunologic: Negative.   Neurological: Negative.   Hematological: Negative.   Psychiatric/Behavioral: Negative.        Objective:   Physical Exam  Constitutional: He is oriented to person, place, and time. He appears well-developed and well-nourished.  HENT:  Head: Normocephalic and atraumatic.  Mouth/Throat: Oropharynx is clear and moist.  Eyes: Conjunctivae and EOM are normal. Pupils are equal, round, and reactive to light. Right eye exhibits no discharge. Left eye exhibits no discharge. No scleral icterus.  Neck: Normal range of motion.  Neck supple. No tracheal deviation present. No thyromegaly present.  Cardiovascular: Normal rate, regular rhythm and normal heart sounds.   No murmur heard. Pulmonary/Chest: Effort normal and breath sounds normal. No respiratory distress. He has no wheezes. He has no rales. He exhibits no tenderness.  Abdominal: Soft. Bowel sounds are normal. He exhibits no mass. There is no tenderness. There is no rebound and no guarding.  Generally slightly tender but no masses or organ enlargement  Musculoskeletal: Normal range of motion. He exhibits no edema.  Lymphadenopathy:    He has no cervical adenopathy.  Neurological: He is alert and oriented to person, place, and time.  Skin: Skin is warm and dry. No rash noted.  Psychiatric: He has a normal mood and affect. His behavior is normal. Judgment and thought content normal.  Nursing note and vitals reviewed.  BP 103/64 mmHg  Pulse 62  Temp(Src) 97.2 F (36.2 C) (Oral)  Ht 5\' 10"  (1.778 m)  Wt 195 lb (88.451 kg)  BMI 27.98 kg/m2        Assessment & Plan:  1. Hematuria -We will still have patient see the urologist as soon as that appointment can be arranged - POCT UA - Microscopic Only - POCT urinalysis dipstick - Urine culture  2. Left-sided low back pain without sciatica -There's been no more recurrence of the back pain that he experienced several days ago.   Patient Instructions  The patient should continue to drink plenty of fluids If he has  any more episodes of pain he should get back in touch with [place] He should take a copy of the x-ray reports as well as the CT scan report with him to see the urologist when he is called and given an appointment to see them Continue with cholesterol medicine and following aggressive therapeutic lifestyle   Arrie Senate MD

## 2014-10-30 LAB — URINE CULTURE: ORGANISM ID, BACTERIA: NO GROWTH

## 2015-01-13 ENCOUNTER — Ambulatory Visit (INDEPENDENT_AMBULATORY_CARE_PROVIDER_SITE_OTHER): Payer: BLUE CROSS/BLUE SHIELD | Admitting: Nurse Practitioner

## 2015-01-13 ENCOUNTER — Encounter: Payer: Self-pay | Admitting: Nurse Practitioner

## 2015-01-13 VITALS — BP 117/75 | HR 62 | Temp 97.4°F | Ht 70.0 in | Wt 198.4 lb

## 2015-01-13 DIAGNOSIS — E119 Type 2 diabetes mellitus without complications: Secondary | ICD-10-CM | POA: Diagnosis not present

## 2015-01-13 DIAGNOSIS — Z8546 Personal history of malignant neoplasm of prostate: Secondary | ICD-10-CM | POA: Diagnosis not present

## 2015-01-13 DIAGNOSIS — I10 Essential (primary) hypertension: Secondary | ICD-10-CM

## 2015-01-13 DIAGNOSIS — E785 Hyperlipidemia, unspecified: Secondary | ICD-10-CM | POA: Diagnosis not present

## 2015-01-13 DIAGNOSIS — Z6828 Body mass index (BMI) 28.0-28.9, adult: Secondary | ICD-10-CM | POA: Diagnosis not present

## 2015-01-13 DIAGNOSIS — Z955 Presence of coronary angioplasty implant and graft: Secondary | ICD-10-CM | POA: Diagnosis not present

## 2015-01-13 LAB — POCT GLYCOSYLATED HEMOGLOBIN (HGB A1C): Hemoglobin A1C: 7.2

## 2015-01-13 LAB — POCT UA - MICROALBUMIN: MICROALBUMIN (UR) POC: NEGATIVE mg/L

## 2015-01-13 MED ORDER — ROSUVASTATIN CALCIUM 10 MG PO TABS: 10.0000 mg | ORAL_TABLET | Freq: Every day | ORAL | Status: DC

## 2015-01-13 MED ORDER — METOPROLOL SUCCINATE ER 50 MG PO TB24: ORAL_TABLET | ORAL | Status: DC

## 2015-01-13 MED ORDER — METFORMIN HCL ER 500 MG PO TB24: 500.0000 mg | ORAL_TABLET | Freq: Every day | ORAL | Status: DC

## 2015-01-13 NOTE — Progress Notes (Signed)
Subjective:    Patient ID: Arthur Torres, male    DOB: 30-Oct-1950, 64 y.o.   MRN: [id number]  Patient in today for follow up of chronic medical problems.    Diabetes He presents for his follow-up diabetic visit. He has type 2 diabetes mellitus. No MedicAlert identification noted. His disease course has been fluctuating. Hypoglycemia symptoms include headaches (Mild early AM achy generalized headaches. ). Pertinent negatives for hypoglycemia include no dizziness. Associated symptoms include polyphagia. Pertinent negatives for diabetes include no chest pain, no polydipsia, no polyuria, no weakness and no weight loss. Risk factors for coronary artery disease include diabetes mellitus, family history, dyslipidemia, hypertension and male sex. Current diabetic treatment includes oral agent (monotherapy). His weight is stable. When asked about meal planning, he reported none. He has not had a previous visit with a dietitian. He rarely participates in exercise. His home blood glucose trend is fluctuating minimally. His breakfast blood glucose is taken between 8-9 am. His breakfast blood glucose range is generally 110-130 mg/dl. His overall blood glucose range is 110-130 mg/dl. An ACE inhibitor/angiotensin II receptor blocker is not being taken. He does not see a podiatrist.Eye exam is not current.  Hypertension This is a chronic problem. The current episode started more than 1 year ago. The problem is uncontrolled. Associated symptoms include headaches (Mild early AM achy generalized headaches. ). Pertinent negatives include no chest pain, palpitations or shortness of breath. Risk factors for coronary artery disease include dyslipidemia, diabetes mellitus and male gender. Past treatments include beta blockers. Hypertensive end-organ damage includes CAD/MI.  Hyperlipidemia This is a chronic problem. The current episode started more than 1 year ago. The problem is uncontrolled. Recent lipid tests were reviewed  and are high. Exacerbating diseases include diabetes. He has no history of hypothyroidism or obesity. Pertinent negatives include no chest pain, myalgias or shortness of breath. Current antihyperlipidemic treatment includes statins. Risk factors for coronary artery disease include diabetes mellitus, dyslipidemia, hypertension and male sex.  CAD with stent placement Had stents put in 10 years ago- Had follow with cardiologist earlier this year - stress test last year was normal   Review of Systems  Constitutional: Positive for appetite change. Negative for weight loss, activity change and unexpected weight change.  Eyes: Negative for visual disturbance.  Respiratory: Negative for cough, chest tightness and shortness of breath.   Cardiovascular: Negative for chest pain, palpitations and leg swelling.  Gastrointestinal: Negative for nausea and constipation.  Endocrine: Positive for polyphagia. Negative for polydipsia and polyuria.  Genitourinary: Negative for decreased urine volume and difficulty urinating.  Musculoskeletal: Negative for myalgias.  Neurological: Positive for light-headedness (Mild, lasting 5-6 hours, not relieved by meals. ) and headaches (Mild early AM achy generalized headaches. ). Negative for dizziness and weakness.  All other systems reviewed and are negative.      Objective:   Physical Exam  Constitutional: He is oriented to person, place, and time. He appears well-developed and well-nourished.  HENT:  Head: Normocephalic.  Right Ear: External ear normal.  Left Ear: External ear normal.  Nose: Nose normal.  Mouth/Throat: Oropharynx is clear and moist.  Eyes: EOM are normal. Pupils are equal, round, and reactive to light.  Neck: Normal range of motion. Neck supple. No JVD present. No thyromegaly present.  Cardiovascular: Normal rate, regular rhythm, normal heart sounds and intact distal pulses.  Exam reveals no gallop and no friction rub.   No murmur  heard. Pulmonary/Chest: Effort normal and breath sounds normal. No respiratory distress.  He has no wheezes. He has no rales. He exhibits no tenderness.  Abdominal: Soft. Bowel sounds are normal. He exhibits no mass. There is no tenderness.  Musculoskeletal: Normal range of motion. He exhibits no edema.  Lymphadenopathy:    He has no cervical adenopathy.  Neurological: He is alert and oriented to person, place, and time. No cranial nerve deficit.  Skin: Skin is warm and dry.  Psychiatric: He has a normal mood and affect. His behavior is normal. Judgment and thought content normal.   BP 117/75 mmHg  Pulse 62  Temp(Src) 97.4 F (36.3 C) (Oral)  Ht _0  (1.778 m)  Wt 198 lb 6.4 oz (89.994 kg)  BMI 28.47 kg/m2  Results for orders placed or performed in visit on 01/13/15  POCT glycosylated hemoglobin (Hb A1C)  Result Value Ref Range   Hemoglobin A1C 7.2        Assessment & Plan:   1. Type 2 diabetes mellitus without complication Watch carbs in diet - POCT glycosylated hemoglobin (Hb A1C) - POCT UA - Microalbumin - metFORMIN (GLUCOPHAGE-XR) 500 MG 24 hr tablet; Take 1 tablet (500 mg total) by mouth daily with breakfast.  Dispense: 90 tablet; Refill: 1  2. Essential hypertension Do not add salt to diet - CMP14+EGFR  3. Hyperlipidemia with target LDL less than 100 Low fat diet - Lipid panel - rosuvastatin (CRESTOR) 10 MG tablet; Take 1 tablet (10 mg total) by mouth daily.  Dispense: 90 tablet; Refill: 1  4. History of right coronary artery stent placement Keep follow up appointments with cardiologist  5. H/O prostate cancer Saw DR. wreen several  Months ago and had prostate check  6. BMI 28.0-28.9,adult Discussed diet and exercise for person with BMI >25 Will recheck weight in 3-6 months   7. Essential hypertension, benign Do not add salt to diet - metoprolol succinate (TOPROL-XL) 50 MG 24 hr tablet; 1 PO QD  Dispense: 90 tablet; Refill: 1    Labs pending Health  maintenance reviewed Diet and exercise encouraged Continue all meds Follow up  In 3 months    [place], FNP

## 2015-01-13 NOTE — Patient Instructions (Signed)
Fat and Cholesterol Control Diet Fat and cholesterol levels in your blood and organs are influenced by your diet. High levels of fat and cholesterol may lead to diseases of the heart, small and large blood vessels, gallbladder, liver, and pancreas. CONTROLLING FAT AND CHOLESTEROL WITH DIET Although exercise and lifestyle factors are important, your diet is key. That is because certain foods are known to raise cholesterol and others to lower it. The goal is to balance foods for their effect on cholesterol and more importantly, to replace saturated and trans fat with other types of fat, such as monounsaturated fat, polyunsaturated fat, and omega-3 fatty acids. On average, a person should consume no more than 15 to 17 g of saturated fat daily. Saturated and trans fats are considered "bad" fats, and they will raise LDL cholesterol. Saturated fats are primarily found in animal products such as meats, butter, and cream. However, that does not mean you need to give up all your favorite foods. Today, there are good tasting, low-fat, low-cholesterol substitutes for most of the things you like to eat. Choose low-fat or nonfat alternatives. Choose round or loin cuts of red meat. These types of cuts are lowest in fat and cholesterol. Chicken (without the skin), fish, veal, and ground turkey breast are great choices. Eliminate fatty meats, such as hot dogs and salami. Even shellfish have little or no saturated fat. Have a 3 oz (85 g) portion when you eat lean meat, poultry, or fish. Trans fats are also called "partially hydrogenated oils." They are oils that have been scientifically manipulated so that they are solid at room temperature resulting in a longer shelf life and improved taste and texture of foods in which they are added. Trans fats are found in stick margarine, some tub margarines, cookies, crackers, and baked goods.  When baking and cooking, oils are a great substitute for butter. The monounsaturated oils are  especially beneficial since it is believed they lower LDL and raise HDL. The oils you should avoid entirely are saturated tropical oils, such as coconut and palm.  Remember to eat a lot from food groups that are naturally free of saturated and trans fat, including fish, fruit, vegetables, beans, grains (barley, rice, couscous, bulgur wheat), and pasta (without cream sauces).  IDENTIFYING FOODS THAT LOWER FAT AND CHOLESTEROL  Soluble fiber may lower your cholesterol. This type of fiber is found in fruits such as apples, vegetables such as broccoli, potatoes, and carrots, legumes such as beans, peas, and lentils, and grains such as barley. Foods fortified with plant sterols (phytosterol) may also lower cholesterol. You should eat at least 2 g per day of these foods for a cholesterol lowering effect.  Read package labels to identify low-saturated fats, trans fat free, and low-fat foods at the supermarket. Select cheeses that have only 2 to 3 g saturated fat per ounce. Use a heart-healthy tub margarine that is free of trans fats or partially hydrogenated oil. When buying baked goods (cookies, crackers), avoid partially hydrogenated oils. Breads and muffins should be made from whole grains (whole-wheat or whole oat flour, instead of "flour" or "enriched flour"). Buy non-creamy canned soups with reduced salt and no added fats.  FOOD PREPARATION TECHNIQUES  Never deep-fry. If you must fry, either stir-fry, which uses very little fat, or use non-stick cooking sprays. When possible, broil, bake, or roast meats, and steam vegetables. Instead of putting butter or margarine on vegetables, use lemon and herbs, applesauce, and cinnamon (for squash and sweet potatoes). Use nonfat   yogurt, salsa, and low-fat dressings for salads.  LOW-SATURATED FAT / LOW-FAT FOOD SUBSTITUTES Meats / Saturated Fat (g)  Avoid: Steak, marbled (3 oz/85 g) / 11 g  Choose: Steak, lean (3 oz/85 g) / 4 g  Avoid: Hamburger (3 oz/85 g) / 7  g  Choose: Hamburger, lean (3 oz/85 g) / 5 g  Avoid: Ham (3 oz/85 g) / 6 g  Choose: Ham, lean cut (3 oz/85 g) / 2.4 g  Avoid: Chicken, with skin, dark meat (3 oz/85 g) / 4 g  Choose: Chicken, skin removed, dark meat (3 oz/85 g) / 2 g  Avoid: Chicken, with skin, light meat (3 oz/85 g) / 2.5 g  Choose: Chicken, skin removed, light meat (3 oz/85 g) / 1 g Dairy / Saturated Fat (g)  Avoid: Whole milk (1 cup) / 5 g  Choose: Low-fat milk, 2% (1 cup) / 3 g  Choose: Low-fat milk, 1% (1 cup) / 1.5 g  Choose: Skim milk (1 cup) / 0.3 g  Avoid: Hard cheese (1 oz/28 g) / 6 g  Choose: Skim milk cheese (1 oz/28 g) / 2 to 3 g  Avoid: Cottage cheese, 4% fat (1 cup) / 6.5 g  Choose: Low-fat cottage cheese, 1% fat (1 cup) / 1.5 g  Avoid: Ice cream (1 cup) / 9 g  Choose: Sherbet (1 cup) / 2.5 g  Choose: Nonfat frozen yogurt (1 cup) / 0.3 g  Choose: Frozen fruit bar / trace  Avoid: Whipped cream (1 tbs) / 3.5 g  Choose: Nondairy whipped topping (1 tbs) / 1 g Condiments / Saturated Fat (g)  Avoid: Mayonnaise (1 tbs) / 2 g  Choose: Low-fat mayonnaise (1 tbs) / 1 g  Avoid: Butter (1 tbs) / 7 g  Choose: Extra light margarine (1 tbs) / 1 g  Avoid: Coconut oil (1 tbs) / 11.8 g  Choose: Olive oil (1 tbs) / 1.8 g  Choose: Corn oil (1 tbs) / 1.7 g  Choose: Safflower oil (1 tbs) / 1.2 g  Choose: Sunflower oil (1 tbs) / 1.4 g  Choose: Soybean oil (1 tbs) / 2.4 g  Choose: Canola oil (1 tbs) / 1 g Document Released: 06/14/2005 Document Revised: 10/09/2012 Document Reviewed: 09/12/2013 [employer] Patient Information 2015 [employer]. This information is not intended to replace advice given to you by your health care provider. Make sure you discuss any questions you have with your health care provider.  

## 2015-01-14 LAB — CMP14+EGFR
ALT: 23 IU/L (ref 0–44)
AST: 15 IU/L (ref 0–40)
Albumin/Globulin Ratio: 1.8 (ref 1.1–2.5)
Albumin: 4.2 g/dL (ref 3.6–4.8)
Alkaline Phosphatase: 66 IU/L (ref 39–117)
BUN/Creatinine Ratio: 15 (ref 10–22)
BUN: 16 mg/dL (ref 8–27)
Bilirubin Total: 0.4 mg/dL (ref 0.0–1.2)
CALCIUM: 9.4 mg/dL (ref 8.6–10.2)
CHLORIDE: 99 mmol/L (ref 97–108)
CO2: 23 mmol/L (ref 18–29)
CREATININE: 1.05 mg/dL (ref 0.76–1.27)
GFR calc Af Amer: 88 mL/min/{1.73_m2} (ref >59)
GFR calc non Af Amer: 76 mL/min/{1.73_m2} (ref >59)
Globulin, Total: 2.3 g/dL (ref 1.5–4.5)
Glucose: 168 mg/dL — ABNORMAL HIGH (ref 65–99)
Potassium: 4.3 mmol/L (ref 3.5–5.2)
Sodium: 138 mmol/L (ref 134–144)
Total Protein: 6.5 g/dL (ref 6.0–8.5)

## 2015-01-14 LAB — LIPID PANEL
Chol/HDL Ratio: 2.4 ratio units (ref 0.0–5.0)
Cholesterol, Total: 108 mg/dL (ref 100–199)
HDL: 45 mg/dL (ref >39)
LDL Calculated: 42 mg/dL (ref 0–99)
TRIGLYCERIDES: 103 mg/dL (ref 0–149)
VLDL CHOLESTEROL CAL: 21 mg/dL (ref 5–40)

## 2015-01-16 ENCOUNTER — Encounter: Payer: Self-pay | Admitting: *Deleted

## 2015-04-16 ENCOUNTER — Ambulatory Visit: Payer: BLUE CROSS/BLUE SHIELD | Admitting: Nurse Practitioner

## 2015-04-17 ENCOUNTER — Ambulatory Visit (INDEPENDENT_AMBULATORY_CARE_PROVIDER_SITE_OTHER): Payer: BLUE CROSS/BLUE SHIELD | Admitting: Nurse Practitioner

## 2015-04-17 ENCOUNTER — Encounter: Payer: Self-pay | Admitting: Nurse Practitioner

## 2015-04-17 VITALS — BP 116/66 | HR 56 | Temp 97.1°F | Ht 70.0 in | Wt 195.0 lb

## 2015-04-17 DIAGNOSIS — E785 Hyperlipidemia, unspecified: Secondary | ICD-10-CM | POA: Diagnosis not present

## 2015-04-17 DIAGNOSIS — Z6827 Body mass index (BMI) 27.0-27.9, adult: Secondary | ICD-10-CM | POA: Diagnosis not present

## 2015-04-17 DIAGNOSIS — Z23 Encounter for immunization: Secondary | ICD-10-CM | POA: Diagnosis not present

## 2015-04-17 DIAGNOSIS — I10 Essential (primary) hypertension: Secondary | ICD-10-CM

## 2015-04-17 DIAGNOSIS — E119 Type 2 diabetes mellitus without complications: Secondary | ICD-10-CM

## 2015-04-17 DIAGNOSIS — Z955 Presence of coronary angioplasty implant and graft: Secondary | ICD-10-CM

## 2015-04-17 LAB — POCT GLYCOSYLATED HEMOGLOBIN (HGB A1C): Hemoglobin A1C: 7.1

## 2015-04-17 NOTE — Patient Instructions (Signed)
Diabetes and Foot Care Diabetes may cause you to have problems because of poor blood supply (circulation) to your feet and legs. This may cause the skin on your feet to become thinner, break easier, and heal more slowly. Your skin may become dry, and the skin may peel and crack. You may also have nerve damage in your legs and feet causing decreased feeling in them. You may not notice minor injuries to your feet that could lead to infections or more serious problems. Taking care of your feet is one of the most important things you can do for yourself.  HOME CARE INSTRUCTIONS  Wear shoes at all times, even in the house. Do not go barefoot. Bare feet are easily injured.  Check your feet daily for blisters, cuts, and redness. If you cannot see the bottom of your feet, use a mirror or ask someone for help.  Wash your feet with warm water (do not use hot water) and mild soap. Then pat your feet and the areas between your toes until they are completely dry. Do not soak your feet as this can dry your skin.  Apply a moisturizing lotion or petroleum jelly (that does not contain alcohol and is unscented) to the skin on your feet and to dry, brittle toenails. Do not apply lotion between your toes.  Trim your toenails straight across. Do not dig under them or around the cuticle. File the edges of your nails with an emery board or nail file.  Do not cut corns or calluses or try to remove them with medicine.  Wear clean socks or stockings every day. Make sure they are not too tight. Do not wear knee-high stockings since they may decrease blood flow to your legs.  Wear shoes that fit properly and have enough cushioning. To break in new shoes, wear them for just a few hours a day. This prevents you from injuring your feet. Always look in your shoes before you put them on to be sure there are no objects inside.  Do not cross your legs. This may decrease the blood flow to your feet.  If you find a minor scrape,  cut, or break in the skin on your feet, keep it and the skin around it clean and dry. These areas may be cleansed with mild soap and water. Do not cleanse the area with peroxide, alcohol, or iodine.  When you remove an adhesive bandage, be sure not to damage the skin around it.  If you have a wound, look at it several times a day to make sure it is healing.  Do not use heating pads or hot water bottles. They may burn your skin. If you have lost feeling in your feet or legs, you may not know it is happening until it is too late.  Make sure your health care provider performs a complete foot exam at least annually or more often if you have foot problems. Report any cuts, sores, or bruises to your health care provider immediately. SEEK MEDICAL CARE IF:   You have an injury that is not healing.  You have cuts or breaks in the skin.  You have an ingrown nail.  You notice redness on your legs or feet.  You feel burning or tingling in your legs or feet.  You have pain or cramps in your legs and feet.  Your legs or feet are numb.  Your feet always feel cold. SEEK IMMEDIATE MEDICAL CARE IF:   There is increasing redness,   swelling, or pain in or around a wound.  There is a red line that goes up your leg.  Pus is coming from a wound.  You develop a fever or as directed by your health care provider.  You notice a bad smell coming from an ulcer or wound.   This information is not intended to replace advice given to you by your health care provider. Make sure you discuss any questions you have with your health care provider.   Document Released: 06/11/2000 Document Revised: 02/14/2013 Document Reviewed: 11/21/2012 Elsevier Interactive Patient Education 2016 [employer]  

## 2015-04-17 NOTE — Progress Notes (Signed)
Subjective:    Patient ID: Arthur Torres, male    DOB: 11-24-1950, 64 y.o.   MRN: [id number]  Patient in today for follow up of chronic medical problems.    Diabetes He presents for his follow-up diabetic visit. He has type 2 diabetes mellitus. No MedicAlert identification noted. His disease course has been fluctuating. Hypoglycemia symptoms include headaches (Mild early AM achy generalized headaches. ). Pertinent negatives for hypoglycemia include no dizziness. Associated symptoms include polyphagia. Pertinent negatives for diabetes include no chest pain, no polydipsia, no polyuria, no weakness and no weight loss. Risk factors for coronary artery disease include diabetes mellitus, family history, dyslipidemia, hypertension and male sex. Current diabetic treatment includes oral agent (monotherapy). His weight is stable. When asked about meal planning, he reported none. He has not had a previous visit with a dietitian. He rarely participates in exercise. His home blood glucose trend is fluctuating minimally. His breakfast blood glucose is taken between 8-9 am. His breakfast blood glucose range is generally 110-130 mg/dl. His overall blood glucose range is 110-130 mg/dl. An ACE inhibitor/angiotensin II receptor blocker is not being taken. He does not see a podiatrist.Eye exam is not current.  Hypertension This is a chronic problem. The current episode started more than 1 year ago. The problem is uncontrolled. Associated symptoms include headaches (Mild early AM achy generalized headaches. ). Pertinent negatives include no chest pain, palpitations or shortness of breath. Risk factors for coronary artery disease include dyslipidemia, diabetes mellitus and male gender. Past treatments include beta blockers. Hypertensive end-organ damage includes CAD/MI.  Hyperlipidemia This is a chronic problem. The current episode started more than 1 year ago. The problem is uncontrolled. Recent lipid tests were reviewed  and are high. Exacerbating diseases include diabetes. He has no history of hypothyroidism or obesity. Pertinent negatives include no chest pain, myalgias or shortness of breath. Current antihyperlipidemic treatment includes statins. Risk factors for coronary artery disease include diabetes mellitus, dyslipidemia, hypertension and male sex.  CAD with stent placement Had stents put in 10 years ago- Had follow with cardiologist earlier this year - stress test last year was normal   Review of Systems  Constitutional: Positive for appetite change. Negative for weight loss, activity change and unexpected weight change.  Eyes: Negative for visual disturbance.  Respiratory: Negative for cough, chest tightness and shortness of breath.   Cardiovascular: Negative for chest pain, palpitations and leg swelling.  Gastrointestinal: Negative for nausea and constipation.  Endocrine: Positive for polyphagia. Negative for polydipsia and polyuria.  Genitourinary: Negative for decreased urine volume and difficulty urinating.  Musculoskeletal: Negative for myalgias.  Neurological: Positive for light-headedness (Mild, lasting 5-6 hours, not relieved by meals. ) and headaches (Mild early AM achy generalized headaches. ). Negative for dizziness and weakness.  All other systems reviewed and are negative.      Objective:   Physical Exam  Constitutional: He is oriented to person, place, and time. He appears well-developed and well-nourished.  HENT:  Head: Normocephalic.  Right Ear: External ear normal.  Left Ear: External ear normal.  Nose: Nose normal.  Mouth/Throat: Oropharynx is clear and moist.  Eyes: EOM are normal. Pupils are equal, round, and reactive to light.  Neck: Normal range of motion. Neck supple. No JVD present. No thyromegaly present.  Cardiovascular: Normal rate, regular rhythm, normal heart sounds and intact distal pulses.  Exam reveals no gallop and no friction rub.   No murmur  heard. Pulmonary/Chest: Effort normal and breath sounds normal. No respiratory distress.  He has no wheezes. He has no rales. He exhibits no tenderness.  Abdominal: Soft. Bowel sounds are normal. He exhibits no mass. There is no tenderness.  Musculoskeletal: Normal range of motion. He exhibits no edema.  Lymphadenopathy:    He has no cervical adenopathy.  Neurological: He is alert and oriented to person, place, and time. No cranial nerve deficit.  Skin: Skin is warm and dry.  Psychiatric: He has a normal mood and affect. His behavior is normal. Judgment and thought content normal.   BP 116/66 mmHg  Pulse 56  Temp(Src) 97.1 F (36.2 C) (Oral)  Ht _0  (1.778 m)  Wt 195 lb (88.451 kg)  BMI 27.98 kg/m2  Results for orders placed or performed in visit on 04/17/15  POCT glycosylated hemoglobin (Hb A1C)  Result Value Ref Range   Hemoglobin A1C 7.1          Assessment & Plan:   1. Type 2 diabetes mellitus without complication, without long-term current use of insulin (HCC) Continue to watch carbs in diet - POCT glycosylated hemoglobin (Hb A1C)  2. Hyperlipidemia with target LDL less than 100 Low fat  - Lipid panel  3. Essential hypertension, benign Do not add salt  o diet - CMP14+EGFR  4. History of right coronary artery stent placement Keep follow up appointment with cardiologist  5. BMI 27.0-27.9,adult Discussed diet and exercise for person with BMI >25 Will recheck weight in 3-6 months     Labs pending Health maintenance reviewed Diet and exercise encouraged Continue all meds Follow up  In 3 month   [place], FNP

## 2015-04-18 LAB — LIPID PANEL
CHOLESTEROL TOTAL: 108 mg/dL (ref 100–199)
Chol/HDL Ratio: 2.7 ratio units (ref 0.0–5.0)
HDL: 40 mg/dL (ref >39)
LDL Calculated: 44 mg/dL (ref 0–99)
Triglycerides: 120 mg/dL (ref 0–149)
VLDL Cholesterol Cal: 24 mg/dL (ref 5–40)

## 2015-04-18 LAB — CMP14+EGFR
ALT: 23 IU/L (ref 0–44)
AST: 17 IU/L (ref 0–40)
Albumin/Globulin Ratio: 1.8 (ref 1.1–2.5)
Albumin: 4.4 g/dL (ref 3.6–4.8)
Alkaline Phosphatase: 60 IU/L (ref 39–117)
BUN/Creatinine Ratio: 19 (ref 10–22)
BUN: 22 mg/dL (ref 8–27)
Bilirubin Total: 0.6 mg/dL (ref 0.0–1.2)
CO2: 22 mmol/L (ref 18–29)
CREATININE: 1.18 mg/dL (ref 0.76–1.27)
Calcium: 9.5 mg/dL (ref 8.6–10.2)
Chloride: 97 mmol/L (ref 97–106)
GFR calc Af Amer: 77 mL/min/{1.73_m2} (ref >59)
GFR calc non Af Amer: 66 mL/min/{1.73_m2} (ref >59)
GLUCOSE: 209 mg/dL — AB (ref 65–99)
Globulin, Total: 2.5 g/dL (ref 1.5–4.5)
Potassium: 4.1 mmol/L (ref 3.5–5.2)
Sodium: 135 mmol/L — ABNORMAL LOW (ref 136–144)
Total Protein: 6.9 g/dL (ref 6.0–8.5)

## 2015-05-09 ENCOUNTER — Ambulatory Visit (INDEPENDENT_AMBULATORY_CARE_PROVIDER_SITE_OTHER): Payer: BLUE CROSS/BLUE SHIELD | Admitting: Family

## 2015-05-09 ENCOUNTER — Encounter: Payer: Self-pay | Admitting: Family

## 2015-05-09 VITALS — BP 125/83 | HR 74 | Temp 98.9°F | Ht 70.0 in | Wt 194.8 lb

## 2015-05-09 DIAGNOSIS — J309 Allergic rhinitis, unspecified: Secondary | ICD-10-CM | POA: Diagnosis not present

## 2015-05-09 MED ORDER — FLUTICASONE PROPIONATE 50 MCG/ACT NA SUSP: 2.0000 | Freq: Every day | NASAL | Status: DC

## 2015-05-09 NOTE — Progress Notes (Signed)
   Subjective:    Patient ID: Arthur Torres, male    DOB: 1951/04/04, 64 y.o.   MRN: [id number]  URI  This is a new problem. The current episode started 1 to 4 weeks ago. The problem has been unchanged. There has been no fever. Associated symptoms include coughing, rhinorrhea, sinus pain, sneezing and a sore throat. Pertinent negatives include no congestion, ear pain, headaches, joint swelling or nausea. He has tried decongestant for the symptoms. The treatment provided mild relief.   Pt reports symptoms beginning after mowing yard.    Review of Systems  Constitutional: Negative.   HENT: Positive for rhinorrhea, sneezing and sore throat. Negative for congestion and ear pain.   Respiratory: Positive for cough.   Cardiovascular: Negative.   Gastrointestinal: Negative.  Negative for nausea.  Endocrine: Negative.   Genitourinary: Negative.   Musculoskeletal: Negative.   Neurological: Negative.  Negative for headaches.  Hematological: Negative.   Psychiatric/Behavioral: Negative.   All other systems reviewed and are negative.      Objective:   Physical Exam  Constitutional: He is oriented to person, place, and time. He appears well-developed and well-nourished. No distress.  HENT:  Head: Normocephalic.  Right Ear: External ear normal.  Left Ear: External ear normal.  Oropharynx erythemas   Eyes: Pupils are equal, round, and reactive to light. Right eye exhibits no discharge. Left eye exhibits no discharge.  Neck: Normal range of motion. Neck supple. No thyromegaly present.  Cardiovascular: Normal rate, regular rhythm, normal heart sounds and intact distal pulses.   No murmur heard. Pulmonary/Chest: Effort normal and breath sounds normal. No respiratory distress. He has no wheezes.  Abdominal: Soft. Bowel sounds are normal. He exhibits no distension. There is no tenderness.  Musculoskeletal: Normal range of motion. He exhibits no edema or tenderness.  Neurological: He is alert  and oriented to person, place, and time. He has normal reflexes. No cranial nerve deficit.  Skin: Skin is warm and dry. No rash noted. No erythema.  Psychiatric: He has a normal mood and affect. His behavior is normal. Judgment and thought content normal.  Vitals reviewed.    BP 125/83 mmHg  Pulse 74  Temp(Src) 98.9 F (37.2 C) (Oral)  Ht 5\' 10"  (1.778 m)  Wt 194 lb 12.8 oz (88.361 kg)  BMI 27.95 kg/m2      Assessment & Plan:  1. Allergic rhinitis, unspecified allergic rhinitis type - Take meds as prescribed - Use a cool mist humidifier  -Use saline nose sprays frequently -Saline irrigations of the nose can be very helpful if done frequently.  * 4X daily for 1 week*  * Use of a nettie pot can be helpful with this. Follow directions with this* -Force fluids -For any cough or congestion  Use plain Mucinex- regular strength or max strength is fine   * Children- consult with Pharmacist for dosing -For fever or aces or pains- take tylenol or ibuprofen appropriate for age and weight.  * for fevers greater than 101 orally you may alternate ibuprofen and tylenol every  3 hours. -Throat lozenges if help - fluticasone (FLONASE) 50 MCG/ACT nasal spray; Place 2 sprays into both nostrils daily.  Dispense: 16 g; Refill: [place], FNP

## 2015-05-09 NOTE — Patient Instructions (Addendum)
Allergic Rhinitis Allergic rhinitis is when the mucous membranes in the nose respond to allergens. Allergens are particles in the air that cause your body to have an allergic reaction. This causes you to release allergic antibodies. Through a chain of events, these eventually cause you to release histamine into the blood stream. Although meant to protect the body, it is this release of histamine that causes your discomfort, such as frequent sneezing, congestion, and an itchy, runny nose.  CAUSES Seasonal allergic rhinitis (hay fever) is caused by pollen allergens that may come from grasses, trees, and weeds. Year-round allergic rhinitis (perennial allergic rhinitis) is caused by allergens such as house dust mites, pet dander, and mold spores. SYMPTOMS  Nasal stuffiness (congestion).  Itchy, runny nose with sneezing and tearing of the eyes. DIAGNOSIS Your health care provider can help you determine the allergen or allergens that trigger your symptoms. If you and your health care provider are unable to determine the allergen, skin or blood testing may be used. Your health care provider will diagnose your condition after taking your health history and performing a physical exam. Your health care provider may assess you for other related conditions, such as asthma, pink eye, or an ear infection. TREATMENT Allergic rhinitis does not have a cure, but it can be controlled by:  Medicines that block allergy symptoms. These may include allergy shots, nasal sprays, and oral antihistamines.  Avoiding the allergen. Hay fever may often be treated with antihistamines in pill or nasal spray forms. Antihistamines block the effects of histamine. There are over-the-counter medicines that may help with nasal congestion and swelling around the eyes. Check with your health care provider before taking or giving this medicine. If avoiding the allergen or the medicine prescribed do not work, there are many new medicines  your health care provider can prescribe. Stronger medicine may be used if initial measures are ineffective. Desensitizing injections can be used if medicine and avoidance does not work. Desensitization is when a patient is given ongoing shots until the body becomes less sensitive to the allergen. Make sure you follow up with your health care provider if problems continue. HOME CARE INSTRUCTIONS It is not possible to completely avoid allergens, but you can reduce your symptoms by taking steps to limit your exposure to them. It helps to know exactly what you are allergic to so that you can avoid your specific triggers. SEEK MEDICAL CARE IF:  You have a fever.  You develop a cough that does not stop easily (persistent).  You have shortness of breath.  You start wheezing.  Symptoms interfere with normal daily activities.   This information is not intended to replace advice given to you by your health care provider. Make sure you discuss any questions you have with your health care provider.   Document Released: 03/09/2001 Document Revised: 07/05/2014 Document Reviewed: 02/19/2013 Elsevier Interactive Patient Education 2016 [place] meds as prescribed - Use a cool mist humidifier  -Use saline nose sprays frequently -Saline irrigations of the nose can be very helpful if done frequently.  * 4X daily for 1 week*  * Use of a nettie pot can be helpful with this. Follow directions with this* -Force fluids -For any cough or congestion  Use plain Mucinex- regular strength or max strength is fine   * Children- consult with Pharmacist for dosing -For fever or aces or pains- take tylenol or ibuprofen appropriate for age and weight.  * for fevers greater than 101 orally  you may alternate ibuprofen and tylenol every  3 hours. -Throat lozenges if help   Evelina Dun, FNP

## 2015-05-12 ENCOUNTER — Ambulatory Visit: Payer: BLUE CROSS/BLUE SHIELD | Admitting: Family

## 2015-07-24 ENCOUNTER — Ambulatory Visit (INDEPENDENT_AMBULATORY_CARE_PROVIDER_SITE_OTHER): Payer: BLUE CROSS/BLUE SHIELD | Admitting: Nurse Practitioner

## 2015-07-24 ENCOUNTER — Encounter: Payer: Self-pay | Admitting: Nurse Practitioner

## 2015-07-24 VITALS — BP 114/72 | HR 63 | Temp 97.0°F | Ht 70.0 in | Wt 200.0 lb

## 2015-07-24 DIAGNOSIS — Z1212 Encounter for screening for malignant neoplasm of rectum: Secondary | ICD-10-CM

## 2015-07-24 DIAGNOSIS — Z1159 Encounter for screening for other viral diseases: Secondary | ICD-10-CM | POA: Diagnosis not present

## 2015-07-24 DIAGNOSIS — E119 Type 2 diabetes mellitus without complications: Secondary | ICD-10-CM

## 2015-07-24 DIAGNOSIS — Z955 Presence of coronary angioplasty implant and graft: Secondary | ICD-10-CM

## 2015-07-24 DIAGNOSIS — Z6827 Body mass index (BMI) 27.0-27.9, adult: Secondary | ICD-10-CM

## 2015-07-24 DIAGNOSIS — I10 Essential (primary) hypertension: Secondary | ICD-10-CM

## 2015-07-24 DIAGNOSIS — E785 Hyperlipidemia, unspecified: Secondary | ICD-10-CM

## 2015-07-24 LAB — POCT GLYCOSYLATED HEMOGLOBIN (HGB A1C): Hemoglobin A1C: 7.4

## 2015-07-24 MED ORDER — METOPROLOL SUCCINATE ER 50 MG PO TB24: ORAL_TABLET | ORAL | Status: DC

## 2015-07-24 MED ORDER — ROSUVASTATIN CALCIUM 10 MG PO TABS: 10.0000 mg | ORAL_TABLET | Freq: Every day | ORAL | Status: DC

## 2015-07-24 MED ORDER — METFORMIN HCL ER 500 MG PO TB24: 500.0000 mg | ORAL_TABLET | Freq: Every day | ORAL | Status: DC

## 2015-07-24 NOTE — Patient Instructions (Signed)
Diabetes and Foot Care Diabetes may cause you to have problems because of poor blood supply (circulation) to your feet and legs. This may cause the skin on your feet to become thinner, break easier, and heal more slowly. Your skin may become dry, and the skin may peel and crack. You may also have nerve damage in your legs and feet causing decreased feeling in them. You may not notice minor injuries to your feet that could lead to infections or more serious problems. Taking care of your feet is one of the most important things you can do for yourself.  HOME CARE INSTRUCTIONS  Wear shoes at all times, even in the house. Do not go barefoot. Bare feet are easily injured.  Check your feet daily for blisters, cuts, and redness. If you cannot see the bottom of your feet, use a mirror or ask someone for help.  Wash your feet with warm water (do not use hot water) and mild soap. Then pat your feet and the areas between your toes until they are completely dry. Do not soak your feet as this can dry your skin.  Apply a moisturizing lotion or petroleum jelly (that does not contain alcohol and is unscented) to the skin on your feet and to dry, brittle toenails. Do not apply lotion between your toes.  Trim your toenails straight across. Do not dig under them or around the cuticle. File the edges of your nails with an emery board or nail file.  Do not cut corns or calluses or try to remove them with medicine.  Wear clean socks or stockings every day. Make sure they are not too tight. Do not wear knee-high stockings since they may decrease blood flow to your legs.  Wear shoes that fit properly and have enough cushioning. To break in new shoes, wear them for just a few hours a day. This prevents you from injuring your feet. Always look in your shoes before you put them on to be sure there are no objects inside.  Do not cross your legs. This may decrease the blood flow to your feet.  If you find a minor scrape,  cut, or break in the skin on your feet, keep it and the skin around it clean and dry. These areas may be cleansed with mild soap and water. Do not cleanse the area with peroxide, alcohol, or iodine.  When you remove an adhesive bandage, be sure not to damage the skin around it.  If you have a wound, look at it several times a day to make sure it is healing.  Do not use heating pads or hot water bottles. They may burn your skin. If you have lost feeling in your feet or legs, you may not know it is happening until it is too late.  Make sure your health care provider performs a complete foot exam at least annually or more often if you have foot problems. Report any cuts, sores, or bruises to your health care provider immediately. SEEK MEDICAL CARE IF:   You have an injury that is not healing.  You have cuts or breaks in the skin.  You have an ingrown nail.  You notice redness on your legs or feet.  You feel burning or tingling in your legs or feet.  You have pain or cramps in your legs and feet.  Your legs or feet are numb.  Your feet always feel cold. SEEK IMMEDIATE MEDICAL CARE IF:   There is increasing redness,   swelling, or pain in or around a wound.  There is a red line that goes up your leg.  Pus is coming from a wound.  You develop a fever or as directed by your health care provider.  You notice a bad smell coming from an ulcer or wound.   This information is not intended to replace advice given to you by your health care provider. Make sure you discuss any questions you have with your health care provider.   Document Released: 06/11/2000 Document Revised: 02/14/2013 Document Reviewed: 11/21/2012 Elsevier Interactive Patient Education 2016 [employer]  

## 2015-07-24 NOTE — Progress Notes (Signed)
Subjective:    Patient ID: Arthur Torres, male    DOB: July 25, 1950, 65 y.o.   MRN: [id number]  Patient here today for follow up of chronic medical problems.  Outpatient Encounter Prescriptions as of 07/24/2015  Medication Sig  . aspirin 325 MG EC tablet Take 325 mg by mouth daily.  . fluticasone (FLONASE) 50 MCG/ACT nasal spray Place 2 sprays into both nostrils daily.  . metFORMIN (GLUCOPHAGE-XR) 500 MG 24 hr tablet Take 1 tablet (500 mg total) by mouth daily with breakfast.  . metoprolol succinate (TOPROL-XL) 50 MG 24 hr tablet 1 PO QD  . rosuvastatin (CRESTOR) 10 MG tablet Take 1 tablet (10 mg total) by mouth daily.   No facility-administered encounter medications on file as of 07/24/2015.       Diabetes He presents for his follow-up diabetic visit. He has type 2 diabetes mellitus. No MedicAlert identification noted. His disease course has been fluctuating. Pertinent negatives for hypoglycemia include no dizziness. Associated symptoms include polyphagia. Pertinent negatives for diabetes include no chest pain, no polydipsia, no polyuria, no weakness and no weight loss. Risk factors for coronary artery disease include diabetes mellitus, family history, dyslipidemia, hypertension and male sex. Current diabetic treatment includes oral agent (monotherapy). His weight is stable. When asked about meal planning, he reported none. He has not had a previous visit with a dietitian. He rarely participates in exercise. Home blood sugar record trend: patient has not been checking blood sugars as of late. An ACE inhibitor/angiotensin II receptor blocker is not being taken. He does not see a podiatrist.Eye exam is not current.  Hypertension This is a chronic problem. The current episode started more than 1 year ago. The problem is uncontrolled. Pertinent negatives include no chest pain, palpitations or shortness of breath. Risk factors for coronary artery disease include dyslipidemia, diabetes mellitus and  male gender. Past treatments include beta blockers. Hypertensive end-organ damage includes CAD/MI.  Hyperlipidemia This is a chronic problem. The current episode started more than 1 year ago. The problem is uncontrolled. Recent lipid tests were reviewed and are high. Exacerbating diseases include diabetes. He has no history of hypothyroidism or obesity. Pertinent negatives include no chest pain, myalgias or shortness of breath. Current antihyperlipidemic treatment includes statins. Risk factors for coronary artery disease include diabetes mellitus, dyslipidemia, hypertension and male sex.  CAD with stent placement Had stents put in 10 years ago- Had follow with cardiologist earlier this year - stress test last year was normal   Review of Systems  Constitutional: Positive for appetite change. Negative for weight loss, activity change and unexpected weight change.  Eyes: Negative for visual disturbance.  Respiratory: Negative for cough, chest tightness and shortness of breath.   Cardiovascular: Negative for chest pain, palpitations and leg swelling.  Gastrointestinal: Negative for nausea and constipation.  Endocrine: Positive for polyphagia. Negative for polydipsia and polyuria.  Genitourinary: Negative for decreased urine volume and difficulty urinating.  Musculoskeletal: Negative for myalgias.  Neurological: Negative for dizziness and weakness.  All other systems reviewed and are negative.      Objective:   Physical Exam  Constitutional: He is oriented to person, place, and time. He appears well-developed and well-nourished.  HENT:  Head: Normocephalic.  Right Ear: External ear normal.  Left Ear: External ear normal.  Nose: Nose normal.  Mouth/Throat: Oropharynx is clear and moist.  Eyes: EOM are normal. Pupils are equal, round, and reactive to light.  Neck: Normal range of motion. Neck supple. No JVD present. No thyromegaly  present.  Cardiovascular: Normal rate, regular rhythm,  normal heart sounds and intact distal pulses.  Exam reveals no gallop and no friction rub.   No murmur heard. Pulmonary/Chest: Effort normal and breath sounds normal. No respiratory distress. He has no wheezes. He has no rales. He exhibits no tenderness.  Abdominal: Soft. Bowel sounds are normal. He exhibits no mass. There is no tenderness.  Musculoskeletal: Normal range of motion. He exhibits no edema.  Lymphadenopathy:    He has no cervical adenopathy.  Neurological: He is alert and oriented to person, place, and time. No cranial nerve deficit.  Skin: Skin is warm and dry.  Psychiatric: He has a normal mood and affect. His behavior is normal. Judgment and thought content normal.   BP 114/72 mmHg  Pulse 63  Temp(Src) 97 F (36.1 C) (Oral)  Ht 5' 10"  (1.778 m)  Wt 200 lb (90.719 kg)  BMI 28.70 kg/m2   Results for orders placed or performed in visit on 07/24/15  POCT glycosylated hemoglobin (Hb A1C)  Result Value Ref Range   Hemoglobin A1C 7.4          Assessment & Plan:  1. Type 2 diabetes mellitus without complication, without long-term current use of insulin (HCC) Stricter carb counting - POCT glycosylated hemoglobin (Hb A1C) - metFORMIN (GLUCOPHAGE-XR) 500 MG 24 hr tablet; Take 1 tablet (500 mg total) by mouth daily with breakfast.  Dispense: 90 tablet; Refill: 1  2. Hyperlipidemia with target LDL less than 100 Low fat diet - Lipid panel - rosuvastatin (CRESTOR) 10 MG tablet; Take 1 tablet (10 mg total) by mouth daily.  Dispense: 90 tablet; Refill: 1  3. Essential hypertension, benign Do not add salt to diet - BMP8+EGFR - metoprolol succinate (TOPROL-XL) 50 MG 24 hr tablet; 1 PO QD  Dispense: 90 tablet; Refill: 1  4. History of right coronary artery stent placement Keep follow up with cardiology  5. BMI 27.0-27.9,adult Discussed diet and exercise for person with BMI >25 Will recheck weight in 3-6 months  6. Need for hepatitis C screening test - Hepatitis C  antibody  7. Screening for malignant neoplasm of the rectum - Fecal occult blood, imunochemical; Future    Labs pending Health maintenance reviewed Diet and exercise encouraged Continue all meds Follow up  In 3 month   [place], FNP

## 2015-07-25 LAB — BMP8+EGFR
BUN/Creatinine Ratio: 15 (ref 10–22)
BUN: 15 mg/dL (ref 8–27)
CO2: 22 mmol/L (ref 18–29)
CREATININE: 1.03 mg/dL (ref 0.76–1.27)
Calcium: 9.1 mg/dL (ref 8.6–10.2)
Chloride: 98 mmol/L (ref 96–106)
GFR, EST AFRICAN AMERICAN: 90 mL/min/{1.73_m2} (ref >59)
GFR, EST NON AFRICAN AMERICAN: 77 mL/min/{1.73_m2} (ref >59)
Glucose: 231 mg/dL — ABNORMAL HIGH (ref 65–99)
POTASSIUM: 4.4 mmol/L (ref 3.5–5.2)
Sodium: 137 mmol/L (ref 134–144)

## 2015-07-25 LAB — LIPID PANEL
CHOL/HDL RATIO: 2.6 ratio (ref 0.0–5.0)
Cholesterol, Total: 98 mg/dL — ABNORMAL LOW (ref 100–199)
HDL: 38 mg/dL — AB (ref >39)
LDL CALC: 34 mg/dL (ref 0–99)
TRIGLYCERIDES: 128 mg/dL (ref 0–149)
VLDL CHOLESTEROL CAL: 26 mg/dL (ref 5–40)

## 2015-07-25 LAB — HEPATITIS C ANTIBODY

## 2015-08-26 LAB — HM DIABETES EYE EXAM

## 2015-10-22 ENCOUNTER — Ambulatory Visit (INDEPENDENT_AMBULATORY_CARE_PROVIDER_SITE_OTHER): Payer: BLUE CROSS/BLUE SHIELD | Admitting: Nurse Practitioner

## 2015-10-22 ENCOUNTER — Encounter: Payer: Self-pay | Admitting: Nurse Practitioner

## 2015-10-22 VITALS — BP 105/66 | HR 57 | Temp 97.0°F | Ht 70.0 in | Wt 192.0 lb

## 2015-10-22 DIAGNOSIS — Z955 Presence of coronary angioplasty implant and graft: Secondary | ICD-10-CM | POA: Diagnosis not present

## 2015-10-22 DIAGNOSIS — E119 Type 2 diabetes mellitus without complications: Secondary | ICD-10-CM

## 2015-10-22 DIAGNOSIS — J301 Allergic rhinitis due to pollen: Secondary | ICD-10-CM | POA: Diagnosis not present

## 2015-10-22 DIAGNOSIS — J309 Allergic rhinitis, unspecified: Secondary | ICD-10-CM | POA: Diagnosis not present

## 2015-10-22 DIAGNOSIS — Z8546 Personal history of malignant neoplasm of prostate: Secondary | ICD-10-CM

## 2015-10-22 DIAGNOSIS — Z6827 Body mass index (BMI) 27.0-27.9, adult: Secondary | ICD-10-CM | POA: Diagnosis not present

## 2015-10-22 DIAGNOSIS — I10 Essential (primary) hypertension: Secondary | ICD-10-CM | POA: Diagnosis not present

## 2015-10-22 DIAGNOSIS — E785 Hyperlipidemia, unspecified: Secondary | ICD-10-CM

## 2015-10-22 LAB — CMP14+EGFR
ALT: 27 IU/L (ref 0–44)
AST: 21 IU/L (ref 0–40)
Albumin/Globulin Ratio: 1.6 (ref 1.2–2.2)
Albumin: 4.5 g/dL (ref 3.6–4.8)
Alkaline Phosphatase: 65 IU/L (ref 39–117)
BUN/Creatinine Ratio: 17 (ref 10–24)
BUN: 20 mg/dL (ref 8–27)
Bilirubin Total: 0.7 mg/dL (ref 0.0–1.2)
CALCIUM: 9.8 mg/dL (ref 8.6–10.2)
CO2: 24 mmol/L (ref 18–29)
CREATININE: 1.18 mg/dL (ref 0.76–1.27)
Chloride: 98 mmol/L (ref 96–106)
GFR calc Af Amer: 76 mL/min/{1.73_m2} (ref >59)
GFR, EST NON AFRICAN AMERICAN: 66 mL/min/{1.73_m2} (ref >59)
GLOBULIN, TOTAL: 2.8 g/dL (ref 1.5–4.5)
GLUCOSE: 128 mg/dL — AB (ref 65–99)
Potassium: 4.5 mmol/L (ref 3.5–5.2)
Sodium: 138 mmol/L (ref 134–144)
Total Protein: 7.3 g/dL (ref 6.0–8.5)

## 2015-10-22 LAB — LIPID PANEL
CHOL/HDL RATIO: 2.4 ratio (ref 0.0–5.0)
CHOLESTEROL TOTAL: 107 mg/dL (ref 100–199)
HDL: 44 mg/dL (ref >39)
LDL CALC: 48 mg/dL (ref 0–99)
TRIGLYCERIDES: 76 mg/dL (ref 0–149)
VLDL Cholesterol Cal: 15 mg/dL (ref 5–40)

## 2015-10-22 LAB — BAYER DCA HB A1C WAIVED: HB A1C: 7.4 % — AB (ref <7.0)

## 2015-10-22 MED ORDER — ROSUVASTATIN CALCIUM 10 MG PO TABS: 10.0000 mg | ORAL_TABLET | Freq: Every day | ORAL | Status: DC

## 2015-10-22 MED ORDER — FLUTICASONE PROPIONATE 50 MCG/ACT NA SUSP: 2.0000 | Freq: Every day | NASAL | Status: DC

## 2015-10-22 MED ORDER — METOPROLOL SUCCINATE ER 50 MG PO TB24: ORAL_TABLET | ORAL | Status: DC

## 2015-10-22 MED ORDER — METFORMIN HCL ER 500 MG PO TB24: 500.0000 mg | ORAL_TABLET | Freq: Every day | ORAL | Status: DC

## 2015-10-22 NOTE — Patient Instructions (Signed)
Diabetes and Foot Care Diabetes may cause you to have problems because of poor blood supply (circulation) to your feet and legs. This may cause the skin on your feet to become thinner, break easier, and heal more slowly. Your skin may become dry, and the skin may peel and crack. You may also have nerve damage in your legs and feet causing decreased feeling in them. You may not notice minor injuries to your feet that could lead to infections or more serious problems. Taking care of your feet is one of the most important things you can do for yourself.  HOME CARE INSTRUCTIONS  Wear shoes at all times, even in the house. Do not go barefoot. Bare feet are easily injured.  Check your feet daily for blisters, cuts, and redness. If you cannot see the bottom of your feet, use a mirror or ask someone for help.  Wash your feet with warm water (do not use hot water) and mild soap. Then pat your feet and the areas between your toes until they are completely dry. Do not soak your feet as this can dry your skin.  Apply a moisturizing lotion or petroleum jelly (that does not contain alcohol and is unscented) to the skin on your feet and to dry, brittle toenails. Do not apply lotion between your toes.  Trim your toenails straight across. Do not dig under them or around the cuticle. File the edges of your nails with an emery board or nail file.  Do not cut corns or calluses or try to remove them with medicine.  Wear clean socks or stockings every day. Make sure they are not too tight. Do not wear knee-high stockings since they may decrease blood flow to your legs.  Wear shoes that fit properly and have enough cushioning. To break in new shoes, wear them for just a few hours a day. This prevents you from injuring your feet. Always look in your shoes before you put them on to be sure there are no objects inside.  Do not cross your legs. This may decrease the blood flow to your feet.  If you find a minor scrape,  cut, or break in the skin on your feet, keep it and the skin around it clean and dry. These areas may be cleansed with mild soap and water. Do not cleanse the area with peroxide, alcohol, or iodine.  When you remove an adhesive bandage, be sure not to damage the skin around it.  If you have a wound, look at it several times a day to make sure it is healing.  Do not use heating pads or hot water bottles. They may burn your skin. If you have lost feeling in your feet or legs, you may not know it is happening until it is too late.  Make sure your health care provider performs a complete foot exam at least annually or more often if you have foot problems. Report any cuts, sores, or bruises to your health care provider immediately. SEEK MEDICAL CARE IF:   You have an injury that is not healing.  You have cuts or breaks in the skin.  You have an ingrown nail.  You notice redness on your legs or feet.  You feel burning or tingling in your legs or feet.  You have pain or cramps in your legs and feet.  Your legs or feet are numb.  Your feet always feel cold. SEEK IMMEDIATE MEDICAL CARE IF:   There is increasing redness,   swelling, or pain in or around a wound.  There is a red line that goes up your leg.  Pus is coming from a wound.  You develop a fever or as directed by your health care provider.  You notice a bad smell coming from an ulcer or wound.   This information is not intended to replace advice given to you by your health care provider. Make sure you discuss any questions you have with your health care provider.   Document Released: 06/11/2000 Document Revised: 02/14/2013 Document Reviewed: 11/21/2012 Elsevier Interactive Patient Education 2016 [employer]  

## 2015-10-22 NOTE — Progress Notes (Signed)
Subjective:    Patient ID: Arthur Torres, male    DOB: [birth year], 66 y.o.   MRN: [id number]  Patient here today for follow up of chronic medical problems.  Outpatient Encounter Prescriptions as of 10/22/2015  Medication Sig  . aspirin 325 MG EC tablet Take 325 mg by mouth daily.  . fluticasone (FLONASE) 50 MCG/ACT nasal spray Place 2 sprays into both nostrils daily.  . metFORMIN (GLUCOPHAGE-XR) 500 MG 24 hr tablet Take 1 tablet (500 mg total) by mouth daily with breakfast.  . metoprolol succinate (TOPROL-XL) 50 MG 24 hr tablet 1 PO QD  . rosuvastatin (CRESTOR) 10 MG tablet Take 1 tablet (10 mg total) by mouth daily.   No facility-administered encounter medications on file as of 10/22/2015.    * no changes since  Last visit  Diabetes He presents for his follow-up diabetic visit. He has type 2 diabetes mellitus. No MedicAlert identification noted. His disease course has been fluctuating. Pertinent negatives for hypoglycemia include no dizziness. Associated symptoms include polyphagia. Pertinent negatives for diabetes include no chest pain, no polydipsia, no polyuria, no weakness and no weight loss. Risk factors for coronary artery disease include diabetes mellitus, family history, dyslipidemia, hypertension and male sex. Current diabetic treatment includes oral agent (monotherapy). His weight is stable. When asked about meal planning, he reported none. He has not had a previous visit with a dietitian. He rarely participates in exercise. Home blood sugar record trend: patient has not been checking blood sugars as of late. An ACE inhibitor/angiotensin II receptor blocker is not being taken. He does not see a podiatrist.Eye exam is not current.  Hypertension This is a chronic problem. The current episode started more than 1 year ago. The problem is uncontrolled. Pertinent negatives include no chest pain, palpitations or shortness of breath. Risk factors for coronary artery disease include  dyslipidemia, diabetes mellitus and male gender. Past treatments include beta blockers. Hypertensive end-organ damage includes CAD/MI.  Hyperlipidemia This is a chronic problem. The current episode started more than 1 year ago. The problem is uncontrolled. Recent lipid tests were reviewed and are high. Exacerbating diseases include diabetes. He has no history of hypothyroidism or obesity. Pertinent negatives include no chest pain, myalgias or shortness of breath. Current antihyperlipidemic treatment includes statins. Risk factors for coronary artery disease include diabetes mellitus, dyslipidemia, hypertension and male sex.  CAD with stent placement Had stents put in 10 years ago- Had follow with cardiologist earlier this year - stress test last year was normal Hx prostate cancer Sees urologist every 6 months Allergic rhinits flonase daily- helps alot    Review of Systems  Constitutional: Positive for appetite change. Negative for weight loss, activity change and unexpected weight change.  Eyes: Negative for visual disturbance.  Respiratory: Negative for cough, chest tightness and shortness of breath.   Cardiovascular: Negative for chest pain, palpitations and leg swelling.  Gastrointestinal: Negative for nausea and constipation.  Endocrine: Positive for polyphagia. Negative for polydipsia and polyuria.  Genitourinary: Negative for decreased urine volume and difficulty urinating.  Musculoskeletal: Negative for myalgias.  Neurological: Negative for dizziness and weakness.  All other systems reviewed and are negative.      Objective:   Physical Exam  Constitutional: He is oriented to person, place, and time. He appears well-developed and well-nourished.  HENT:  Head: Normocephalic.  Right Ear: External ear normal.  Left Ear: External ear normal.  Nose: Nose normal.  Mouth/Throat: Oropharynx is clear and moist.  Eyes: EOM are normal. Pupils  are equal, round, and reactive to light.   Neck: Normal range of motion. Neck supple. No JVD present. No thyromegaly present.  Cardiovascular: Normal rate, regular rhythm, normal heart sounds and intact distal pulses.  Exam reveals no gallop and no friction rub.   No murmur heard. Pulmonary/Chest: Effort normal and breath sounds normal. No respiratory distress. He has no wheezes. He has no rales. He exhibits no tenderness.  Abdominal: Soft. Bowel sounds are normal. He exhibits no mass. There is no tenderness.  Musculoskeletal: Normal range of motion. He exhibits no edema.  Lymphadenopathy:    He has no cervical adenopathy.  Neurological: He is alert and oriented to person, place, and time. No cranial nerve deficit.  Skin: Skin is warm and dry.  Psychiatric: He has a normal mood and affect. His behavior is normal. Judgment and thought content normal.   BP 105/66 mmHg  Pulse 57  Temp(Src) 97 F (36.1 C) (Oral)  Ht 5' 10"  (1.778 m)  Wt 192 lb (87.091 kg)  BMI 27.55 kg/m2  Hgba1c discussed at appointment 7.4%- unchanged from previous    Assessment & Plan:  1. Type 2 diabetes mellitus without complication, without long-term current use of insulin (HCC) Stricter carb counting - Bayer DCA Hb A1c Waived - metFORMIN (GLUCOPHAGE-XR) 500 MG 24 hr tablet; Take 1 tablet (500 mg total) by mouth daily with breakfast.  Dispense: 90 tablet; Refill: 1  2. Hyperlipidemia with target LDL less than 100 Low fta diet - Lipid panel - rosuvastatin (CRESTOR) 10 MG tablet; Take 1 tablet (10 mg total) by mouth daily.  Dispense: 90 tablet; Refill: 1  3. Essential hypertension, benign Do not add salt o diet - CMP14+EGFR - metoprolol succinate (TOPROL-XL) 50 MG 24 hr tablet; 1 PO QD  Dispense: 90 tablet; Refill: 1  4. History of right coronary artery stent placement Keep follow up with Cardiologist  5. H/O prostate cancer Follow up with Dr. Carmelina Noun 3 months ago  6. BMI 27.0-27.9,adult Discussed diet and exercise for person with BMI >25 Will  recheck weight in 3-6 months  7. Allergic rhinitis due to pollen flonase as needed     Labs pending Health maintenance reviewed Diet and exercise encouraged Continue all meds Follow up  In 3 months   [place], FNP

## 2016-01-22 ENCOUNTER — Encounter: Payer: Self-pay | Admitting: Nurse Practitioner

## 2016-01-22 ENCOUNTER — Ambulatory Visit (INDEPENDENT_AMBULATORY_CARE_PROVIDER_SITE_OTHER): Payer: BLUE CROSS/BLUE SHIELD | Admitting: Nurse Practitioner

## 2016-01-22 VITALS — BP 117/74 | HR 58 | Temp 97.1°F | Ht 70.0 in | Wt 192.0 lb

## 2016-01-22 DIAGNOSIS — E119 Type 2 diabetes mellitus without complications: Secondary | ICD-10-CM

## 2016-01-22 DIAGNOSIS — I10 Essential (primary) hypertension: Secondary | ICD-10-CM | POA: Diagnosis not present

## 2016-01-22 DIAGNOSIS — Z6827 Body mass index (BMI) 27.0-27.9, adult: Secondary | ICD-10-CM

## 2016-01-22 DIAGNOSIS — R3911 Hesitancy of micturition: Secondary | ICD-10-CM | POA: Diagnosis not present

## 2016-01-22 DIAGNOSIS — J301 Allergic rhinitis due to pollen: Secondary | ICD-10-CM | POA: Diagnosis not present

## 2016-01-22 DIAGNOSIS — Z8546 Personal history of malignant neoplasm of prostate: Secondary | ICD-10-CM | POA: Diagnosis not present

## 2016-01-22 DIAGNOSIS — Z955 Presence of coronary angioplasty implant and graft: Secondary | ICD-10-CM

## 2016-01-22 DIAGNOSIS — E785 Hyperlipidemia, unspecified: Secondary | ICD-10-CM

## 2016-01-22 LAB — BAYER DCA HB A1C WAIVED: HB A1C: 7.3 % — AB (ref <7.0)

## 2016-01-22 NOTE — Progress Notes (Signed)
Subjective:    Patient ID: Arthur Torres, male    DOB: 08/05/1950, 64 y.o.   MRN: [id number]  Patient here today for follow up of chronic medical problems. No complaints today.  Outpatient Encounter Prescriptions as of 01/22/2016  Medication Sig  . aspirin 325 MG EC tablet Take 325 mg by mouth daily.  . metFORMIN (GLUCOPHAGE-XR) 500 MG 24 hr tablet Take 1 tablet (500 mg total) by mouth daily with breakfast.  . metoprolol succinate (TOPROL-XL) 50 MG 24 hr tablet 1 PO QD  . rosuvastatin (CRESTOR) 10 MG tablet Take 1 tablet (10 mg total) by mouth daily.   No facility-administered encounter medications on file as of 01/22/2016.    Diabetes  He presents for his follow-up diabetic visit. He has type 2 diabetes mellitus. No MedicAlert identification noted. His disease course has been fluctuating. Pertinent negatives for hypoglycemia include no dizziness. Pertinent negatives for diabetes include no chest pain, no polydipsia, no polyphagia, no polyuria, no weakness and no weight loss. Risk factors for coronary artery disease include diabetes mellitus, family history, dyslipidemia, hypertension and male sex. Current diabetic treatment includes oral agent (monotherapy). His weight is stable. When asked about meal planning, he reported none. He has not had a previous visit with a dietitian. He rarely participates in exercise. Home blood sugar record trend: patient has not been checking blood sugars as of late. Says that he cannot afford test strips. An ACE inhibitor/angiotensin II receptor blocker is not being taken. He does not see a podiatrist.Eye exam is not current.  Hypertension  This is a chronic problem. The current episode started more than 1 year ago. The problem is uncontrolled. Pertinent negatives include no chest pain, palpitations or shortness of breath. Risk factors for coronary artery disease include dyslipidemia, diabetes mellitus and male gender. Past treatments include beta blockers.  Hypertensive end-organ damage includes CAD/MI.  Hyperlipidemia  This is a chronic problem. The current episode started more than 1 year ago. The problem is uncontrolled. Recent lipid tests were reviewed and are high. Exacerbating diseases include diabetes. He has no history of hypothyroidism or obesity. Pertinent negatives include no chest pain, myalgias or shortness of breath. Current antihyperlipidemic treatment includes statins. Risk factors for coronary artery disease include diabetes mellitus, dyslipidemia, hypertension and male sex.  CAD with stent placement Had stents put in 10 years ago- Had follow with cardiologist earlier this year - stress test last year was normal Hx prostate cancer Starting to have urge to void that goes away on it's own. Is going to follow up with urologist. Allergic rhinits flonase daily- helps alot    Review of Systems  Constitutional: Negative for activity change, unexpected weight change and weight loss.  Eyes: Negative for visual disturbance.  Respiratory: Negative for cough, chest tightness and shortness of breath.   Cardiovascular: Negative for chest pain, palpitations and leg swelling.  Gastrointestinal: Negative for constipation and nausea.  Endocrine: Negative for polydipsia, polyphagia and polyuria.  Genitourinary: Negative for decreased urine volume and difficulty urinating.  Musculoskeletal: Negative for myalgias.  Neurological: Negative for dizziness and weakness.  All other systems reviewed and are negative.      Objective:   Physical Exam  Constitutional: He is oriented to person, place, and time. He appears well-developed and well-nourished.  HENT:  Head: Normocephalic.  Right Ear: External ear normal.  Left Ear: External ear normal.  Nose: Nose normal.  Mouth/Throat: Oropharynx is clear and moist.  Eyes: EOM are normal. Pupils are equal, round, and  reactive to light.  Neck: Normal range of motion. Neck supple. No JVD present. No  thyromegaly present.  Cardiovascular: Normal rate, regular rhythm, normal heart sounds and intact distal pulses.  Exam reveals no gallop and no friction rub.   No murmur heard. Pulmonary/Chest: Effort normal and breath sounds normal. No respiratory distress. He has no wheezes. He has no rales. He exhibits no tenderness.  Abdominal: Soft. Bowel sounds are normal. He exhibits no mass. There is no tenderness.  Musculoskeletal: Normal range of motion. He exhibits no edema.  Lymphadenopathy:    He has no cervical adenopathy.  Neurological: He is alert and oriented to person, place, and time. No cranial nerve deficit.  Skin: Skin is warm and dry.  Psychiatric: He has a normal mood and affect. His behavior is normal. Judgment and thought content normal.   BP 117/74   Pulse (!) 58   Temp 97.1 F (36.2 C) (Oral)   Ht _0  (1.778 m)   Wt 192 lb (87.1 kg)   BMI 27.55 kg/m   hgba1c 7.3%  Assessment & Plan:  1. Type 2 diabetes mellitus without complication, without long-term current use of insulin (HCC) Continue to watch carbs in diet - Bayer DCA Hb A1c Waived  2. Hyperlipidemia with target LDL less than 100 Low fat diet - Lipid panel  3. Essential hypertension, benign Do not add salt in diet - BMP8+EGFR  4. Seasonal allergic rhinitis due to pollen  5. History of right coronary artery stent placement  6. BMI 27.0-27.9,adult Discussed diet and exercise for person with BMI >25 Will recheck weight in 3-6 months  7. H/O prostate cancer Will see urologist  8. Urinary hesitancy - PSA, total and free - Ambulatory referral to Urology    Labs pending Health maintenance reviewed Diet and exercise encouraged Continue all meds Follow up  In 3 month   [place], FNP

## 2016-01-23 LAB — LIPID PANEL
CHOL/HDL RATIO: 3.9 ratio (ref 0.0–5.0)
Cholesterol, Total: 140 mg/dL (ref 100–199)
HDL: 36 mg/dL — ABNORMAL LOW (ref >39)
LDL Calculated: 67 mg/dL (ref 0–99)
Triglycerides: 183 mg/dL — ABNORMAL HIGH (ref 0–149)
VLDL Cholesterol Cal: 37 mg/dL (ref 5–40)

## 2016-01-23 LAB — BMP8+EGFR
BUN / CREAT RATIO: 17 (ref 10–24)
BUN: 20 mg/dL (ref 8–27)
CO2: 24 mmol/L (ref 18–29)
Calcium: 9.5 mg/dL (ref 8.6–10.2)
Chloride: 97 mmol/L (ref 96–106)
Creatinine, Ser: 1.16 mg/dL (ref 0.76–1.27)
GFR, EST AFRICAN AMERICAN: 78 mL/min/{1.73_m2} (ref >59)
GFR, EST NON AFRICAN AMERICAN: 67 mL/min/{1.73_m2} (ref >59)
Glucose: 149 mg/dL — ABNORMAL HIGH (ref 65–99)
POTASSIUM: 4.2 mmol/L (ref 3.5–5.2)
SODIUM: 138 mmol/L (ref 134–144)

## 2016-01-23 LAB — PSA, TOTAL AND FREE: PROSTATE SPECIFIC AG, SERUM: 0.1 ng/mL (ref 0.0–4.0)

## 2016-01-26 ENCOUNTER — Other Ambulatory Visit: Payer: BLUE CROSS/BLUE SHIELD

## 2016-01-26 DIAGNOSIS — Z1212 Encounter for screening for malignant neoplasm of rectum: Secondary | ICD-10-CM

## 2016-01-27 LAB — FECAL OCCULT BLOOD, IMMUNOCHEMICAL: Fecal Occult Bld: NEGATIVE

## 2016-04-23 ENCOUNTER — Encounter: Payer: Self-pay | Admitting: Nurse Practitioner

## 2016-04-23 ENCOUNTER — Ambulatory Visit (INDEPENDENT_AMBULATORY_CARE_PROVIDER_SITE_OTHER): Payer: BLUE CROSS/BLUE SHIELD | Admitting: Nurse Practitioner

## 2016-04-23 VITALS — BP 120/77 | HR 58 | Temp 97.0°F | Ht 70.0 in | Wt 191.0 lb

## 2016-04-23 DIAGNOSIS — E785 Hyperlipidemia, unspecified: Secondary | ICD-10-CM

## 2016-04-23 DIAGNOSIS — Z8546 Personal history of malignant neoplasm of prostate: Secondary | ICD-10-CM | POA: Diagnosis not present

## 2016-04-23 DIAGNOSIS — I10 Essential (primary) hypertension: Secondary | ICD-10-CM | POA: Diagnosis not present

## 2016-04-23 DIAGNOSIS — Z955 Presence of coronary angioplasty implant and graft: Secondary | ICD-10-CM | POA: Diagnosis not present

## 2016-04-23 DIAGNOSIS — J301 Allergic rhinitis due to pollen: Secondary | ICD-10-CM

## 2016-04-23 DIAGNOSIS — E119 Type 2 diabetes mellitus without complications: Secondary | ICD-10-CM | POA: Diagnosis not present

## 2016-04-23 DIAGNOSIS — Z6827 Body mass index (BMI) 27.0-27.9, adult: Secondary | ICD-10-CM | POA: Diagnosis not present

## 2016-04-23 LAB — CMP14+EGFR
ALK PHOS: 67 IU/L (ref 39–117)
ALT: 27 IU/L (ref 0–44)
AST: 19 IU/L (ref 0–40)
Albumin/Globulin Ratio: 1.4 (ref 1.2–2.2)
Albumin: 4.2 g/dL (ref 3.6–4.8)
BUN/Creatinine Ratio: 17 (ref 10–24)
BUN: 19 mg/dL (ref 8–27)
Bilirubin Total: 0.4 mg/dL (ref 0.0–1.2)
CALCIUM: 9.6 mg/dL (ref 8.6–10.2)
CO2: 22 mmol/L (ref 18–29)
CREATININE: 1.1 mg/dL (ref 0.76–1.27)
Chloride: 98 mmol/L (ref 96–106)
GFR calc Af Amer: 83 mL/min/{1.73_m2} (ref >59)
GFR, EST NON AFRICAN AMERICAN: 72 mL/min/{1.73_m2} (ref >59)
Globulin, Total: 2.9 g/dL (ref 1.5–4.5)
Glucose: 137 mg/dL — ABNORMAL HIGH (ref 65–99)
POTASSIUM: 4.3 mmol/L (ref 3.5–5.2)
SODIUM: 138 mmol/L (ref 134–144)
Total Protein: 7.1 g/dL (ref 6.0–8.5)

## 2016-04-23 LAB — LIPID PANEL
CHOL/HDL RATIO: 3 ratio (ref 0.0–5.0)
CHOLESTEROL TOTAL: 124 mg/dL (ref 100–199)
HDL: 41 mg/dL (ref >39)
LDL CALC: 43 mg/dL (ref 0–99)
TRIGLYCERIDES: 201 mg/dL — AB (ref 0–149)
VLDL Cholesterol Cal: 40 mg/dL (ref 5–40)

## 2016-04-23 LAB — BAYER DCA HB A1C WAIVED: HB A1C: 7 % — AB (ref <7.0)

## 2016-04-23 MED ORDER — METOPROLOL SUCCINATE ER 50 MG PO TB24: ORAL_TABLET | ORAL | 1 refills | Status: DC

## 2016-04-23 MED ORDER — METFORMIN HCL ER 500 MG PO TB24: 500.0000 mg | ORAL_TABLET | Freq: Every day | ORAL | 1 refills | Status: DC

## 2016-04-23 MED ORDER — ROSUVASTATIN CALCIUM 10 MG PO TABS: 10.0000 mg | ORAL_TABLET | Freq: Every day | ORAL | 1 refills | Status: DC

## 2016-04-23 NOTE — Patient Instructions (Signed)
DASH Eating Plan  DASH stands for "Dietary Approaches to Stop Hypertension." The DASH eating plan is a healthy eating plan that has been shown to reduce high blood pressure (hypertension). Additional health benefits may include reducing the risk of type 2 diabetes mellitus, heart disease, and stroke. The DASH eating plan may also help with weight loss.  WHAT DO I NEED TO KNOW ABOUT THE DASH EATING PLAN?  For the DASH eating plan, you will follow these general guidelines:  · Choose foods with a percent daily value for sodium of less than 5% (as listed on the food label).  · Use salt-free seasonings or herbs instead of table salt or sea salt.  · Check with your health care provider or pharmacist before using salt substitutes.  · Eat lower-sodium products, often labeled as "lower sodium" or "no salt added."  · Eat fresh foods.  · Eat more vegetables, fruits, and low-fat dairy products.  · Choose whole grains. Look for the word "whole" as the first word in the ingredient list.  · Choose fish and skinless chicken or turkey more often than red meat. Limit fish, poultry, and meat to 6 oz (170 g) each day.  · Limit sweets, desserts, sugars, and sugary drinks.  · Choose heart-healthy fats.  · Limit cheese to 1 oz (28 g) per day.  · Eat more home-cooked food and less restaurant, buffet, and fast food.  · Limit fried foods.  · Cook foods using methods other than frying.  · Limit canned vegetables. If you do use them, rinse them well to decrease the sodium.  · When eating at a restaurant, ask that your food be prepared with less salt, or no salt if possible.  WHAT FOODS CAN I EAT?  Seek help from a dietitian for individual calorie needs.  Grains  Whole grain or whole wheat bread. Brown rice. Whole grain or whole wheat pasta. Quinoa, bulgur, and whole grain cereals. Low-sodium cereals. Corn or whole wheat flour tortillas. Whole grain cornbread. Whole grain crackers. Low-sodium crackers.  Vegetables  Fresh or frozen vegetables  (raw, steamed, roasted, or grilled). Low-sodium or reduced-sodium tomato and vegetable juices. Low-sodium or reduced-sodium tomato sauce and paste. Low-sodium or reduced-sodium canned vegetables.   Fruits  All fresh, canned (in natural juice), or frozen fruits.  Meat and Other Protein Products  Ground beef (85% or leaner), grass-fed beef, or beef trimmed of fat. Skinless chicken or turkey. Ground chicken or turkey. Pork trimmed of fat. All fish and seafood. Eggs. Dried beans, peas, or lentils. Unsalted nuts and seeds. Unsalted canned beans.  Dairy  Low-fat dairy products, such as skim or 1% milk, 2% or reduced-fat cheeses, low-fat ricotta or cottage cheese, or plain low-fat yogurt. Low-sodium or reduced-sodium cheeses.  Fats and Oils  Tub margarines without trans fats. Light or reduced-fat mayonnaise and salad dressings (reduced sodium). Avocado. Safflower, olive, or canola oils. Natural peanut or almond butter.  Other  Unsalted popcorn and pretzels.  The items listed above may not be a complete list of recommended foods or beverages. Contact your dietitian for more options.  WHAT FOODS ARE NOT RECOMMENDED?  Grains  White bread. White pasta. White rice. Refined cornbread. Bagels and croissants. Crackers that contain trans fat.  Vegetables  Creamed or fried vegetables. Vegetables in a cheese sauce. Regular canned vegetables. Regular canned tomato sauce and paste. Regular tomato and vegetable juices.  Fruits  Dried fruits. Canned fruit in light or heavy syrup. Fruit juice.  Meat and Other Protein   Products  Fatty cuts of meat. Ribs, chicken wings, bacon, sausage, bologna, salami, chitterlings, fatback, hot dogs, bratwurst, and packaged luncheon meats. Salted nuts and seeds. Canned beans with salt.  Dairy  Whole or 2% milk, cream, half-and-half, and cream cheese. Whole-fat or sweetened yogurt. Full-fat cheeses or blue cheese. Nondairy creamers and whipped toppings. Processed cheese, cheese spreads, or cheese  curds.  Condiments  Onion and garlic salt, seasoned salt, table salt, and sea salt. Canned and packaged gravies. Worcestershire sauce. Tartar sauce. Barbecue sauce. Teriyaki sauce. Soy sauce, including reduced sodium. Steak sauce. Fish sauce. Oyster sauce. Cocktail sauce. Horseradish. Ketchup and mustard. Meat flavorings and tenderizers. Bouillon cubes. Hot sauce. Tabasco sauce. Marinades. Taco seasonings. Relishes.  Fats and Oils  Butter, stick margarine, lard, shortening, ghee, and bacon fat. Coconut, palm kernel, or palm oils. Regular salad dressings.  Other  Pickles and olives. Salted popcorn and pretzels.  The items listed above may not be a complete list of foods and beverages to avoid. Contact your dietitian for more information.  WHERE CAN I FIND MORE INFORMATION?  [employer], [employer], [employer]: www.nhlbi.nih.gov/health/health-topics/topics/dash/     This information is not intended to replace advice given to you by your health care provider. Make sure you discuss any questions you have with your health care provider.     Document Released: 06/03/2011 Document Revised: 07/05/2014 Document Reviewed: 04/18/2013  [employer] Interactive Patient Education ©2016 [employer]

## 2016-04-23 NOTE — Progress Notes (Signed)
Subjective:    Patient ID: Arthur Torres, male    DOB: 1950-09-02, 65 y.o.   MRN: [id number]  Patient here today for follow up of chronic medical problems. No complaints today.  Outpatient Encounter Prescriptions as of 04/23/2016  Medication Sig  . aspirin 325 MG EC tablet Take 325 mg by mouth daily.  . metFORMIN (GLUCOPHAGE-XR) 500 MG 24 hr tablet Take 1 tablet (500 mg total) by mouth daily with breakfast.  . metoprolol succinate (TOPROL-XL) 50 MG 24 hr tablet 1 PO QD  . rosuvastatin (CRESTOR) 10 MG tablet Take 1 tablet (10 mg total) by mouth daily.   No facility-administered encounter medications on file as of 04/23/2016.    Diabetes  He presents for his follow-up diabetic visit. He has type 2 diabetes mellitus. No MedicAlert identification noted. His disease course has been fluctuating. Pertinent negatives for hypoglycemia include no dizziness. Pertinent negatives for diabetes include no chest pain, no polydipsia, no polyphagia, no polyuria, no weakness and no weight loss. Risk factors for coronary artery disease include diabetes mellitus, family history, dyslipidemia, hypertension and male sex. Current diabetic treatment includes oral agent (monotherapy). He is compliant with treatment all of the time. His weight is stable. When asked about meal planning, he reported none. He has not had a previous visit with a dietitian. He rarely participates in exercise. Home blood sugar record trend: patient has not been checking blood sugars as of late. Says that he cannot afford test strips. An ACE inhibitor/angiotensin II receptor blocker is not being taken. He does not see a podiatrist.Eye exam is not current (last exam ~6 months ago).  Hypertension  This is a chronic problem. The current episode started more than 1 year ago. The problem is uncontrolled. Pertinent negatives include no chest pain, palpitations or shortness of breath. Risk factors for coronary artery disease include dyslipidemia,  diabetes mellitus and male gender. Past treatments include beta blockers. Compliance problems include diet and exercise.  Hypertensive end-organ damage includes CAD/MI.  Hyperlipidemia  This is a chronic problem. The current episode started more than 1 year ago. The problem is uncontrolled. Recent lipid tests were reviewed and are high. Exacerbating diseases include diabetes. He has no history of hypothyroidism or obesity. Factors aggravating his hyperlipidemia include beta blockers. Pertinent negatives include no chest pain, myalgias or shortness of breath. Current antihyperlipidemic treatment includes statins. Compliance problems include adherence to exercise and adherence to diet.  Risk factors for coronary artery disease include diabetes mellitus, dyslipidemia, hypertension and male sex.  CAD with stent placement Had stents put in 10 years ago- Cardiologist informed him at last visit that he no longer needed to see him unless the patient desires to be seen in a couple of years. Hx prostate cancer No longer has the urge to void without being able to.    Review of Systems  Constitutional: Negative for activity change, unexpected weight change and weight loss.  Eyes: Negative for visual disturbance.  Respiratory: Negative for cough, chest tightness and shortness of breath.   Cardiovascular: Negative for chest pain, palpitations and leg swelling.  Gastrointestinal: Negative for constipation and nausea.  Endocrine: Negative for polydipsia, polyphagia and polyuria.  Genitourinary: Negative for decreased urine volume and difficulty urinating.  Musculoskeletal: Negative for myalgias.  Neurological: Negative for dizziness and weakness.  All other systems reviewed and are negative.      Objective:   Physical Exam  Constitutional: He is oriented to person, place, and time. He appears well-developed and well-nourished.  HENT:  Head: Normocephalic.  Right Ear: External ear normal.  Left Ear:  External ear normal.  Nose: Nose normal.  Mouth/Throat: Oropharynx is clear and moist.  Eyes: EOM are normal. Pupils are equal, round, and reactive to light.  Neck: Normal range of motion. Neck supple. No JVD present. No thyromegaly present.  Cardiovascular: Normal rate, regular rhythm, normal heart sounds and intact distal pulses.  Exam reveals no gallop and no friction rub.   No murmur heard. Pulmonary/Chest: Effort normal and breath sounds normal. No respiratory distress. He has no wheezes. He has no rales. He exhibits no tenderness.  Abdominal: Soft. Bowel sounds are normal. He exhibits no mass. There is no tenderness.  Musculoskeletal: Normal range of motion. He exhibits no edema.  Lymphadenopathy:    He has no cervical adenopathy.  Neurological: He is alert and oriented to person, place, and time. No cranial nerve deficit.  Skin: Skin is warm and dry.  Psychiatric: He has a normal mood and affect. His behavior is normal. Judgment and thought content normal.   BP 120/77   Pulse (!) 58   Temp 97 F (36.1 C) (Oral)   Ht _0  (1.778 m)   Wt 191 lb (86.6 kg)   BMI 27.41 kg/m   HgbA1c 7.0% down from 7.3% at last visit  Assessment & Plan:  1. Type 2 diabetes mellitus without complication, without long-term current use of insulin ([facility]) Watch the amount of carbs consumed. - Bayer DCA Hb A1c Waived - Microalbumin / creatinine urine ratio - metFORMIN (GLUCOPHAGE-XR) 500 MG 24 hr tablet; Take 1 tablet (500 mg total) by mouth daily with breakfast.  Dispense: 90 tablet; Refill: 1  2. Hyperlipidemia with target LDL less than 100 Avoid fatty foods. - Lipid panel - rosuvastatin (CRESTOR) 10 MG tablet; Take 1 tablet (10 mg total) by mouth daily.  Dispense: 90 tablet; Refill: 1  3. Essential hypertension, benign Do not add salt to diet. - CMP14+EGFR - metoprolol succinate (TOPROL-XL) 50 MG 24 hr tablet; 1 PO QD  Dispense: 90 tablet; Refill: 1  4. Chronic seasonal allergic rhinitis  due to pollen Controlled.  5. History of right coronary artery stent placement No longer needs to see cardiologist.  6. H/O prostate cancer  7. BMI 27.0-27.9,adult Discussed diet and exercise for person with BMI >25 Will recheck weight in 3-6 months   Labs pending Health maintenance reviewed Diet and exercise encouraged Continue all meds Follow up in 3 months   Hendricks Limes, BSN-RN/FNP student  [place], [place]

## 2016-06-03 ENCOUNTER — Encounter: Payer: Self-pay | Admitting: Nurse Practitioner

## 2016-06-03 ENCOUNTER — Ambulatory Visit (INDEPENDENT_AMBULATORY_CARE_PROVIDER_SITE_OTHER): Payer: BLUE CROSS/BLUE SHIELD | Admitting: Nurse Practitioner

## 2016-06-03 VITALS — BP 124/76 | HR 62 | Temp 96.7°F | Ht 70.0 in | Wt 192.0 lb

## 2016-06-03 DIAGNOSIS — L989 Disorder of the skin and subcutaneous tissue, unspecified: Secondary | ICD-10-CM | POA: Diagnosis not present

## 2016-06-03 NOTE — Progress Notes (Signed)
   Subjective:    Patient ID: Arthur Torres, male    DOB: July 09, 1950, 65 y.o.   MRN: [id number]  HPI Patient comes in stating that he had a mole on left temple area- he had for years and all the sudden it just fail off and now you can hardly see where it was. His wife was concerned and wanted area looked at.    Review of Systems  Constitutional: Negative.   HENT: Negative.   Respiratory: Negative.   Cardiovascular: Negative.   Gastrointestinal: Negative.   Genitourinary: Negative.   Neurological: Negative.   Psychiatric/Behavioral: Negative.   All other systems reviewed and are negative.      Objective:   Physical Exam  Constitutional: He is oriented to person, place, and time. He appears well-developed and well-nourished. No distress.  Cardiovascular: Normal rate, regular rhythm and normal heart sounds.   Pulmonary/Chest: Effort normal and breath sounds normal.  Neurological: He is alert and oriented to person, place, and time.  Skin: Skin is warm.  Small flaky area with no discoloration left temple  Psychiatric: He has a normal mood and affect. His behavior is normal. Judgment and thought content normal.   BP 124/76   Pulse 62   Temp (!) 96.7 F (35.9 C) (Oral)   Ht 5\' 10"  (1.778 m)   Wt 192 lb (87.1 kg)   BMI 27.55 kg/m      Assessment & Plan:   1. Skin lesion of face    Watch for return of lesion RTO prn  [name] Done, FNP

## 2016-07-27 ENCOUNTER — Ambulatory Visit (INDEPENDENT_AMBULATORY_CARE_PROVIDER_SITE_OTHER): Payer: BLUE CROSS/BLUE SHIELD | Admitting: Nurse Practitioner

## 2016-07-27 ENCOUNTER — Encounter: Payer: Self-pay | Admitting: Nurse Practitioner

## 2016-07-27 VITALS — BP 111/62 | HR 57 | Temp 96.7°F | Ht 70.0 in | Wt 195.0 lb

## 2016-07-27 DIAGNOSIS — I10 Essential (primary) hypertension: Secondary | ICD-10-CM

## 2016-07-27 DIAGNOSIS — E119 Type 2 diabetes mellitus without complications: Secondary | ICD-10-CM

## 2016-07-27 DIAGNOSIS — E785 Hyperlipidemia, unspecified: Secondary | ICD-10-CM

## 2016-07-27 DIAGNOSIS — Z6827 Body mass index (BMI) 27.0-27.9, adult: Secondary | ICD-10-CM | POA: Diagnosis not present

## 2016-07-27 LAB — CMP14+EGFR
A/G RATIO: 2 (ref 1.2–2.2)
ALT: 29 IU/L (ref 0–44)
AST: 19 IU/L (ref 0–40)
Albumin: 4.5 g/dL (ref 3.6–4.8)
Alkaline Phosphatase: 66 IU/L (ref 39–117)
BILIRUBIN TOTAL: 0.4 mg/dL (ref 0.0–1.2)
BUN / CREAT RATIO: 19 (ref 10–24)
BUN: 21 mg/dL (ref 8–27)
CHLORIDE: 97 mmol/L (ref 96–106)
CO2: 24 mmol/L (ref 18–29)
CREATININE: 1.12 mg/dL (ref 0.76–1.27)
Calcium: 9.4 mg/dL (ref 8.6–10.2)
GFR calc Af Amer: 80 mL/min/{1.73_m2} (ref >59)
GFR calc non Af Amer: 70 mL/min/{1.73_m2} (ref >59)
GLOBULIN, TOTAL: 2.3 g/dL (ref 1.5–4.5)
Glucose: 136 mg/dL — ABNORMAL HIGH (ref 65–99)
POTASSIUM: 4.4 mmol/L (ref 3.5–5.2)
SODIUM: 138 mmol/L (ref 134–144)
Total Protein: 6.8 g/dL (ref 6.0–8.5)

## 2016-07-27 LAB — LIPID PANEL
CHOLESTEROL TOTAL: 114 mg/dL (ref 100–199)
Chol/HDL Ratio: 2.9 ratio units (ref 0.0–5.0)
HDL: 39 mg/dL — ABNORMAL LOW (ref >39)
LDL CALC: 50 mg/dL (ref 0–99)
Triglycerides: 124 mg/dL (ref 0–149)
VLDL Cholesterol Cal: 25 mg/dL (ref 5–40)

## 2016-07-27 LAB — BAYER DCA HB A1C WAIVED: HB A1C (BAYER DCA - WAIVED): 7.7 % — ABNORMAL HIGH (ref <7.0)

## 2016-07-27 MED ORDER — METFORMIN HCL 1000 MG PO TABS: 1000.0000 mg | ORAL_TABLET | Freq: Two times a day (BID) | ORAL | Status: DC

## 2016-07-27 NOTE — Progress Notes (Signed)
Subjective:    Patient ID: Arthur Torres, male    DOB: 26-Aug-1950, 66 y.o.   MRN: [id number]  Patient here today for follow up of chronic medical problems. No complaints today.  Outpatient Encounter Prescriptions as of 07/27/2016  Medication Sig  . aspirin 325 MG EC tablet Take 325 mg by mouth daily.  . metFORMIN (GLUCOPHAGE-XR) 500 MG 24 hr tablet Take 1 tablet (500 mg total) by mouth daily with breakfast.  . metoprolol succinate (TOPROL-XL) 50 MG 24 hr tablet 1 PO QD  . rosuvastatin (CRESTOR) 10 MG tablet Take 1 tablet (10 mg total) by mouth daily.   No facility-administered encounter medications on file as of 07/27/2016.    Diabetes  He presents for his follow-up diabetic visit. He has type 2 diabetes mellitus. No MedicAlert identification noted. His disease course has been fluctuating. Pertinent negatives for hypoglycemia include no dizziness. Pertinent negatives for diabetes include no chest pain, no polydipsia, no polyphagia, no polyuria, no weakness and no weight loss. Risk factors for coronary artery disease include diabetes mellitus, family history, dyslipidemia, hypertension and male sex. Current diabetic treatment includes oral agent (monotherapy). He is compliant with treatment all of the time. His weight is stable. When asked about meal planning, he reported none (not watching diet at all.). He has not had a previous visit with a dietitian. He rarely participates in exercise. Home blood sugar record trend: patient has not been checking blood sugars as of late. Says that he cannot afford test strips. An ACE inhibitor/angiotensin II receptor blocker is not being taken. He does not see a podiatrist.Eye exam is not current (last exam ~6 months ago).  Hypertension  This is a chronic problem. The current episode started more than 1 year ago. The problem is uncontrolled. Pertinent negatives include no chest pain, palpitations or shortness of breath. Risk factors for coronary artery disease  include dyslipidemia, diabetes mellitus and male gender. Past treatments include beta blockers. Compliance problems include diet and exercise.  Hypertensive end-organ damage includes CAD/MI.  Hyperlipidemia  This is a chronic problem. The current episode started more than 1 year ago. The problem is uncontrolled. Recent lipid tests were reviewed and are high. Exacerbating diseases include diabetes. He has no history of hypothyroidism or obesity. Factors aggravating his hyperlipidemia include beta blockers. Pertinent negatives include no chest pain, myalgias or shortness of breath. Current antihyperlipidemic treatment includes statins. Compliance problems include adherence to exercise and adherence to diet.  Risk factors for coronary artery disease include diabetes mellitus, dyslipidemia, hypertension and male sex.  CAD with stent placement Had stents put in 10 years ago- Cardiologist informed him at last visit that he no longer needed to see him unless the patient desires to be seen in a couple of years. Hx prostate cancer No longer has the urge to void without being able to.    Review of Systems  Constitutional: Negative for activity change, unexpected weight change and weight loss.  Eyes: Negative for visual disturbance.  Respiratory: Negative for cough, chest tightness and shortness of breath.   Cardiovascular: Negative for chest pain, palpitations and leg swelling.  Gastrointestinal: Negative for constipation and nausea.  Endocrine: Negative for polydipsia, polyphagia and polyuria.  Genitourinary: Negative for decreased urine volume and difficulty urinating.  Musculoskeletal: Negative for myalgias.  Neurological: Negative for dizziness and weakness.  All other systems reviewed and are negative.      Objective:   Physical Exam  Constitutional: He is oriented to person, place, and time. He  appears well-developed and well-nourished.  HENT:  Head: Normocephalic.  Right Ear: External ear  normal.  Left Ear: External ear normal.  Nose: Nose normal.  Mouth/Throat: Oropharynx is clear and moist.  Eyes: EOM are normal. Pupils are equal, round, and reactive to light.  Neck: Normal range of motion. Neck supple. No JVD present. No thyromegaly present.  Cardiovascular: Normal rate, regular rhythm, normal heart sounds and intact distal pulses.  Exam reveals no gallop and no friction rub.   No murmur heard. Pulmonary/Chest: Effort normal and breath sounds normal. No respiratory distress. He has no wheezes. He has no rales. He exhibits no tenderness.  Abdominal: Soft. Bowel sounds are normal. He exhibits no mass. There is no tenderness.  Musculoskeletal: Normal range of motion. He exhibits no edema.  Lymphadenopathy:    He has no cervical adenopathy.  Neurological: He is alert and oriented to person, place, and time. No cranial nerve deficit.  Skin: Skin is warm and dry.  Psychiatric: He has a normal mood and affect. His behavior is normal. Judgment and thought content normal.   BP 111/62   Pulse (!) 57   Temp (!) 96.7 F (35.9 C) (Oral)   Ht 5' 10"  (1.778 m)   Wt 195 lb (88.5 kg)   BMI 27.98 kg/m    HgbA1c 7.7%  Up from 7.0% at last visit  Assessment & Plan:  1. Type 2 diabetes mellitus without complication, without long-term current use of insulin ([facility]) Stricter carb counting - Bayer DCA Hb A1c Waived - metFORMIN (GLUCOPHAGE) 1000 MG tablet; Take 1 tablet (1,000 mg total) by mouth 2 (two) times daily with a meal.  Dispense: 180 tablet; Refill: 01  2. Hyperlipidemia with target LDL less than 100 Increased metformin from 56m BID to 1000 mg BID - Lipid panel  3. Essential hypertension, benign Low sodium diet - CMP14+EGFR  4. BMI 27.0-27.9,adult Discussed diet and exercise for person with BMI >25 Will recheck weight in 3-6 months    Labs pending Health maintenance reviewed Diet and exercise encouraged Continue all meds Follow up  In 3 months   [place] FNP

## 2016-07-27 NOTE — Patient Instructions (Signed)
Carbohydrate Counting for Diabetes Mellitus, Adult Carbohydrate counting is a method for keeping track of how many carbohydrates you eat. Eating carbohydrates naturally increases the amount of sugar (glucose) in the blood. Counting how many carbohydrates you eat helps keep your blood glucose within normal limits, which helps you manage your diabetes (diabetes mellitus). It is important to know how many carbohydrates you can safely have in each meal. This is different for every person. A diet and nutrition specialist (registered dietitian) can help you make a meal plan and calculate how many carbohydrates you should have at each meal and snack. Carbohydrates are found in the following foods:  Grains, such as breads and cereals.  Dried beans and soy products.  Starchy vegetables, such as potatoes, peas, and corn.  Fruit and fruit juices.  Milk and yogurt.  Sweets and snack foods, such as cake, cookies, candy, chips, and soft drinks. How do I count carbohydrates? There are two ways to count carbohydrates in food. You can use either of the methods or a combination of both. Reading "Nutrition Facts" on packaged food  The "Nutrition Facts" list is included on the labels of almost all packaged foods and beverages in the [place] It includes:  The serving size.  Information about nutrients in each serving, including the grams (g) of carbohydrate per serving. To use the "Nutrition Facts":  Decide how many servings you will have.  Multiply the number of servings by the number of carbohydrates per serving.  The resulting number is the total amount of carbohydrates that you will be having. Learning standard serving sizes of other foods  When you eat foods containing carbohydrates that are not packaged or do not include "Nutrition Facts" on the label, you need to measure the servings in order to count the amount of carbohydrates:  Measure the foods that you will eat with a food scale or measuring  cup, if needed.  Decide how many standard-size servings you will eat.  Multiply the number of servings by 15. Most carbohydrate-rich foods have about 15 g of carbohydrates per serving.  For example, if you eat 8 oz (170 g) of strawberries, you will have eaten 2 servings and 30 g of carbohydrates (2 servings x 15 g = 30 g).  For foods that have more than one food mixed, such as soups and casseroles, you must count the carbohydrates in each food that is included. The following list contains standard serving sizes of common carbohydrate-rich foods. Each of these servings has about 15 g of carbohydrates:   hamburger bun or  English muffin.   oz (15 mL) syrup.   oz (14 g) jelly.  1 slice of bread.  1 six-inch tortilla.  3 oz (85 g) cooked rice or pasta.  4 oz (113 g) cooked dried beans.  4 oz (113 g) starchy vegetable, such as peas, corn, or potatoes.  4 oz (113 g) hot cereal.  4 oz (113 g) mashed potatoes or  of a large baked potato.  4 oz (113 g) canned or frozen fruit.  4 oz (120 mL) fruit juice.  4-6 crackers.  6 chicken nuggets.  6 oz (170 g) unsweetened dry cereal.  6 oz (170 g) plain fat-free yogurt or yogurt sweetened with artificial sweeteners.  8 oz (240 mL) milk.  8 oz (170 g) fresh fruit or one small piece of fruit.  24 oz (680 g) popped popcorn. Example of carbohydrate counting Sample meal  3 oz (85 g) chicken breast.  6 oz (  170 g) brown rice.  4 oz (113 g) corn.  8 oz (240 mL) milk.  8 oz (170 g) strawberries with sugar-free whipped topping. Carbohydrate calculation 1. Identify the foods that contain carbohydrates:  Rice.  Corn.  Milk.  Strawberries. 2. Calculate how many servings you have of each food:  2 servings rice.  1 serving corn.  1 serving milk.  1 serving strawberries. 3. Multiply each number of servings by 15 g:  2 servings rice x 15 g = 30 g.  1 serving corn x 15 g = 15 g.  1 serving milk x 15 g = 15  g.  1 serving strawberries x 15 g = 15 g. 4. Add together all of the amounts to find the total grams of carbohydrates eaten:  30 g + 15 g + 15 g + 15 g = 75 g of carbohydrates total. This information is not intended to replace advice given to you by your health care provider. Make sure you discuss any questions you have with your health care provider. Document Released: 06/14/2005 Document Revised: 01/02/2016 Document Reviewed: 11/26/2015 Elsevier Interactive Patient Education  2017 [employer]  

## 2016-08-21 ENCOUNTER — Ambulatory Visit (INDEPENDENT_AMBULATORY_CARE_PROVIDER_SITE_OTHER): Payer: BLUE CROSS/BLUE SHIELD | Admitting: Family Medicine

## 2016-08-21 ENCOUNTER — Encounter: Payer: Self-pay | Admitting: Family Medicine

## 2016-08-21 VITALS — BP 121/77 | HR 74 | Temp 97.0°F | Ht 70.0 in | Wt 194.0 lb

## 2016-08-21 DIAGNOSIS — J0100 Acute maxillary sinusitis, unspecified: Secondary | ICD-10-CM

## 2016-08-21 MED ORDER — AZITHROMYCIN 250 MG PO TABS: ORAL_TABLET | ORAL | 0 refills | Status: DC

## 2016-08-21 NOTE — Progress Notes (Signed)
BP 121/77 (BP Location: Left Arm, Patient Position: Sitting, Cuff Size: Normal)   Pulse 74   Temp 97 F (36.1 C) (Oral)   Ht 5\' 10"  (1.778 m)   Wt 194 lb (88 kg)   BMI 27.84 kg/m    Subjective:    Patient ID: Arthur Torres, male    DOB: [birth year], 66 y.o.   MRN: [id number]  HPI: Arthur Torres is a 66 y.o. male presenting on 08/21/2016 for URI (sore throat, drainage started yesterday)   HPI Cough and sore throat and drainage Patient has been having cough and sore throat and drainage that started since yesterday. His wife was ill last week and was treated for influenza but she got better without having to take the medication. He started having the cough last night and was having a lot of postnasal drainage overnight and coughing associated with it. He denies any fevers or chills or shortness of breath or wheezing. He has not used anything over-the-counter for this just yet. He started to feel little achy this morning as well.  Relevant past medical, surgical, family and social history reviewed and updated as indicated. Interim medical history since our last visit reviewed. Allergies and medications reviewed and updated.  Review of Systems  Constitutional: Negative for chills and fever.  HENT: Positive for congestion, postnasal drip, rhinorrhea, sinus pressure and sore throat. Negative for ear discharge, ear pain, sneezing and voice change.   Eyes: Negative for pain, discharge, redness and visual disturbance.  Respiratory: Positive for cough. Negative for shortness of breath and wheezing.   Cardiovascular: Negative for chest pain and leg swelling.  Musculoskeletal: Positive for myalgias. Negative for gait problem.  Skin: Negative for rash.  All other systems reviewed and are negative.   Per HPI unless specifically indicated above   Allergies as of 08/21/2016      Reactions   Penicillins    Zocor [simvastatin]       Medication List       Accurate as of 08/21/16 10:11 AM.  Always use your most recent med list.          aspirin 325 MG EC tablet Take 325 mg by mouth daily.   metFORMIN 1000 MG tablet Commonly known as:  GLUCOPHAGE Take 1 tablet (1,000 mg total) by mouth 2 (two) times daily with a meal.   metoprolol succinate 50 MG 24 hr tablet Commonly known as:  TOPROL-XL 1 PO QD   rosuvastatin 10 MG tablet Commonly known as:  CRESTOR Take 1 tablet (10 mg total) by mouth daily.          Objective:    BP 121/77 (BP Location: Left Arm, Patient Position: Sitting, Cuff Size: Normal)   Pulse 74   Temp 97 F (36.1 C) (Oral)   Ht 5\' 10"  (1.778 m)   Wt 194 lb (88 kg)   BMI 27.84 kg/m   Wt Readings from Last 3 Encounters:  08/21/16 194 lb (88 kg)  07/27/16 195 lb (88.5 kg)  06/03/16 192 lb (87.1 kg)    Physical Exam  Constitutional: He is oriented to person, place, and time. He appears well-developed and well-nourished. No distress.  HENT:  Right Ear: Tympanic membrane, external ear and ear canal normal.  Left Ear: Tympanic membrane, external ear and ear canal normal.  Nose: Mucosal edema and rhinorrhea present. No sinus tenderness. No epistaxis. Right sinus exhibits maxillary sinus tenderness. Right sinus exhibits no frontal sinus tenderness. Left sinus exhibits maxillary sinus tenderness.  Left sinus exhibits no frontal sinus tenderness.  Mouth/Throat: Uvula is midline and mucous membranes are normal. Posterior oropharyngeal edema and posterior oropharyngeal erythema present. No oropharyngeal exudate or tonsillar abscesses.  Eyes: Conjunctivae are normal. Right eye exhibits no discharge. Left eye exhibits no discharge. No scleral icterus.  Neck: Neck supple. No thyromegaly present.  Cardiovascular: Normal rate, regular rhythm, normal heart sounds and intact distal pulses.   No murmur heard. Pulmonary/Chest: Effort normal and breath sounds normal. No respiratory distress. He has no wheezes. He has no rales.  Musculoskeletal: Normal range of  motion. He exhibits no edema.  Lymphadenopathy:    He has no cervical adenopathy.  Neurological: He is alert and oriented to person, place, and time. Coordination normal.  Skin: Skin is warm and dry. No rash noted. He is not diaphoretic.  Psychiatric: He has a normal mood and affect. His behavior is normal.  Nursing note and vitals reviewed.  Rapid flu: Negative Rapid strep: Negative     Assessment & Plan:   Problem List Items Addressed This Visit    None    Visit Diagnoses    Acute non-recurrent maxillary sinusitis    -  Primary   Relevant Medications   azithromycin (ZITHROMAX) 250 MG tablet   Other Relevant Orders   Veritor Flu A/B Waived   Rapid strep screen (not at [facility])       Follow up plan: Return if symptoms worsen or fail to improve.  Counseling provided for all of the vaccine components Orders Placed This Encounter  Procedures  . Veritor Flu A/B Waived  . Rapid strep screen (not at [facility])    Caryl Pina, MD [facility] 08/21/2016, 10:11 AM

## 2016-08-23 LAB — RAPID STREP SCREEN (MED CTR MEBANE ONLY): Strep Gp A Ag, IA W/Reflex: NEGATIVE

## 2016-08-23 LAB — VERITOR FLU A/B WAIVED
Influenza A: NEGATIVE
Influenza B: NEGATIVE

## 2016-08-23 LAB — CULTURE, GROUP A STREP

## 2016-10-26 ENCOUNTER — Ambulatory Visit (INDEPENDENT_AMBULATORY_CARE_PROVIDER_SITE_OTHER): Payer: BLUE CROSS/BLUE SHIELD | Admitting: Nurse Practitioner

## 2016-10-26 ENCOUNTER — Encounter: Payer: Self-pay | Admitting: Nurse Practitioner

## 2016-10-26 VITALS — BP 107/66 | HR 56 | Temp 97.0°F | Ht 70.0 in | Wt 193.0 lb

## 2016-10-26 DIAGNOSIS — Z6827 Body mass index (BMI) 27.0-27.9, adult: Secondary | ICD-10-CM | POA: Diagnosis not present

## 2016-10-26 DIAGNOSIS — I10 Essential (primary) hypertension: Secondary | ICD-10-CM

## 2016-10-26 DIAGNOSIS — E119 Type 2 diabetes mellitus without complications: Secondary | ICD-10-CM | POA: Diagnosis not present

## 2016-10-26 DIAGNOSIS — E785 Hyperlipidemia, unspecified: Secondary | ICD-10-CM

## 2016-10-26 LAB — BAYER DCA HB A1C WAIVED: HB A1C: 6.9 % (ref <7.0)

## 2016-10-26 MED ORDER — METFORMIN HCL 1000 MG PO TABS: 1000.0000 mg | ORAL_TABLET | Freq: Two times a day (BID) | ORAL | Status: DC

## 2016-10-26 MED ORDER — ROSUVASTATIN CALCIUM 10 MG PO TABS: 10.0000 mg | ORAL_TABLET | Freq: Every day | ORAL | 1 refills | Status: DC

## 2016-10-26 MED ORDER — METOPROLOL SUCCINATE ER 50 MG PO TB24: ORAL_TABLET | ORAL | 1 refills | Status: DC

## 2016-10-26 NOTE — Progress Notes (Signed)
Subjective:    Patient ID: Arthur Torres, male    DOB: 08-29-1950, 66 y.o.   MRN: [id number]  HPI   KAELON WEEKES is here today for follow up of chronic medical problem.  Outpatient Encounter Prescriptions as of 10/26/2016  Medication Sig  . aspirin 325 MG EC tablet Take 325 mg by mouth daily.  Marland Kitchen azithromycin (ZITHROMAX) 250 MG tablet Take 2 the first day and then one each day after.  . metFORMIN (GLUCOPHAGE) 1000 MG tablet Take 1 tablet (1,000 mg total) by mouth 2 (two) times daily with a meal.  . metoprolol succinate (TOPROL-XL) 50 MG 24 hr tablet 1 PO QD  . rosuvastatin (CRESTOR) 10 MG tablet Take 1 tablet (10 mg total) by mouth daily.   No facility-administered encounter medications on file as of 10/26/2016.     1. Type 2 diabetes mellitus without complication, without long-term current use of insulin (HCC)  Last hgba1c was 7.7%. No changes were made to meds at that time. Blood sugars have been running 130-150 fasting. Does not check blood sugar everyday.  2. Hyperlipidemia with target LDL less than 100  Does not watch diet very closely  3. Essential hypertension, benign  No c/ chest pain,SOB or HS- does not check blood presure at home  4. BMI 27.0-27.9,adult   no recent weight gain or weight loss    New complaints: none today    Review of Systems  Constitutional: Negative for diaphoresis.  Eyes: Negative for pain.  Respiratory: Negative for shortness of breath.   Cardiovascular: Negative for chest pain, palpitations and leg swelling.  Gastrointestinal: Negative for abdominal pain.  Endocrine: Negative for polydipsia.  Skin: Negative for rash.  Neurological: Negative for dizziness, weakness and headaches.  Hematological: Does not bruise/bleed easily.       Objective:   Physical Exam  Constitutional: He is oriented to person, place, and time. He appears well-developed and well-nourished.  HENT:  Head: Normocephalic.  Right Ear: External ear normal.  Left Ear:  External ear normal.  Nose: Nose normal.  Mouth/Throat: Oropharynx is clear and moist.  Eyes: EOM are normal. Pupils are equal, round, and reactive to light.  Neck: Normal range of motion. Neck supple. No JVD present. No thyromegaly present.  Cardiovascular: Normal rate, regular rhythm, normal heart sounds and intact distal pulses.  Exam reveals no gallop and no friction rub.   No murmur heard. Pulmonary/Chest: Effort normal and breath sounds normal. No respiratory distress. He has no wheezes. He has no rales. He exhibits no tenderness.  Abdominal: Soft. Bowel sounds are normal. He exhibits no mass. There is no tenderness.  Musculoskeletal: Normal range of motion. He exhibits no edema.  Lymphadenopathy:    He has no cervical adenopathy.  Neurological: He is alert and oriented to person, place, and time. No cranial nerve deficit.  Skin: Skin is warm and dry.  Psychiatric: He has a normal mood and affect. His behavior is normal. Judgment and thought content normal.   BP 107/66   Pulse (!) 56   Temp 97 F (36.1 C) (Oral)   Ht 5' 10"  (1.778 m)   Wt 193 lb (87.5 kg)   BMI 27.69 kg/m   Hgba1c discussed at appointment        Assessment & Plan:  1. Type 2 diabetes mellitus without complication, without long-term current use of insulin (HCC) Continue to watch carbs in diet Need to check blood sugars at least a couple times a week fasting -  Bayer DCA Hb A1c Waived - metFORMIN (GLUCOPHAGE) 1000 MG tablet; Take 1 tablet (1,000 mg total) by mouth 2 (two) times daily with a meal.  Dispense: 180 tablet; Refill: 01  2. Hyperlipidemia with target LDL less than 100 Low fat diet enciuraged - Lipid panel - rosuvastatin (CRESTOR) 10 MG tablet; Take 1 tablet (10 mg total) by mouth daily.  Dispense: 90 tablet; Refill: 1  3. Essential hypertension, benign Low sodium diet - CMP14+EGFR - metoprolol succinate (TOPROL-XL) 50 MG 24 hr tablet; 1 PO QD  Dispense: 90 tablet; Refill: 1  4. BMI  27.0-27.9,adult Discussed diet and exercise for person with BMI >25 Will recheck weight in 3-6 months    Labs pending Health maintenance reviewed Diet and exercise encouraged Continue all meds Follow up  In 3 months   [place], FNP

## 2016-10-26 NOTE — Patient Instructions (Signed)

## 2016-10-27 LAB — LIPID PANEL
Chol/HDL Ratio: 2.4 ratio (ref 0.0–5.0)
Cholesterol, Total: 96 mg/dL — ABNORMAL LOW (ref 100–199)
HDL: 40 mg/dL (ref >39)
LDL Calculated: 29 mg/dL (ref 0–99)
Triglycerides: 135 mg/dL (ref 0–149)
VLDL CHOLESTEROL CAL: 27 mg/dL (ref 5–40)

## 2016-10-27 LAB — CMP14+EGFR
ALBUMIN: 4.3 g/dL (ref 3.6–4.8)
ALT: 26 IU/L (ref 0–44)
AST: 18 IU/L (ref 0–40)
Albumin/Globulin Ratio: 1.9 (ref 1.2–2.2)
Alkaline Phosphatase: 59 IU/L (ref 39–117)
BUN / CREAT RATIO: 16 (ref 10–24)
BUN: 17 mg/dL (ref 8–27)
Bilirubin Total: 0.3 mg/dL (ref 0.0–1.2)
CALCIUM: 9.4 mg/dL (ref 8.6–10.2)
CO2: 24 mmol/L (ref 18–29)
CREATININE: 1.04 mg/dL (ref 0.76–1.27)
Chloride: 99 mmol/L (ref 96–106)
GFR calc Af Amer: 88 mL/min/{1.73_m2} (ref >59)
GFR, EST NON AFRICAN AMERICAN: 76 mL/min/{1.73_m2} (ref >59)
GLOBULIN, TOTAL: 2.3 g/dL (ref 1.5–4.5)
Glucose: 119 mg/dL — ABNORMAL HIGH (ref 65–99)
Potassium: 4.5 mmol/L (ref 3.5–5.2)
SODIUM: 140 mmol/L (ref 134–144)
TOTAL PROTEIN: 6.6 g/dL (ref 6.0–8.5)

## 2016-11-24 ENCOUNTER — Encounter: Payer: Self-pay | Admitting: *Deleted

## 2017-01-27 ENCOUNTER — Ambulatory Visit: Payer: BLUE CROSS/BLUE SHIELD | Admitting: Nurse Practitioner

## 2017-01-31 ENCOUNTER — Ambulatory Visit (INDEPENDENT_AMBULATORY_CARE_PROVIDER_SITE_OTHER): Payer: BLUE CROSS/BLUE SHIELD | Admitting: Nurse Practitioner

## 2017-01-31 ENCOUNTER — Encounter: Payer: Self-pay | Admitting: Nurse Practitioner

## 2017-01-31 VITALS — BP 114/69 | HR 71 | Temp 97.3°F | Ht 70.0 in | Wt 192.0 lb

## 2017-01-31 DIAGNOSIS — E119 Type 2 diabetes mellitus without complications: Secondary | ICD-10-CM | POA: Diagnosis not present

## 2017-01-31 DIAGNOSIS — E785 Hyperlipidemia, unspecified: Secondary | ICD-10-CM

## 2017-01-31 DIAGNOSIS — I10 Essential (primary) hypertension: Secondary | ICD-10-CM

## 2017-01-31 DIAGNOSIS — J301 Allergic rhinitis due to pollen: Secondary | ICD-10-CM | POA: Diagnosis not present

## 2017-01-31 DIAGNOSIS — Z6827 Body mass index (BMI) 27.0-27.9, adult: Secondary | ICD-10-CM | POA: Diagnosis not present

## 2017-01-31 DIAGNOSIS — Z955 Presence of coronary angioplasty implant and graft: Secondary | ICD-10-CM

## 2017-01-31 LAB — LIPID PANEL
CHOL/HDL RATIO: 2.6 ratio (ref 0.0–5.0)
Cholesterol, Total: 104 mg/dL (ref 100–199)
HDL: 40 mg/dL (ref >39)
LDL CALC: 35 mg/dL (ref 0–99)
Triglycerides: 145 mg/dL (ref 0–149)
VLDL CHOLESTEROL CAL: 29 mg/dL (ref 5–40)

## 2017-01-31 LAB — CMP14+EGFR
A/G RATIO: 1.8 (ref 1.2–2.2)
ALK PHOS: 60 IU/L (ref 39–117)
ALT: 25 IU/L (ref 0–44)
AST: 17 IU/L (ref 0–40)
Albumin: 4.7 g/dL (ref 3.6–4.8)
BUN/Creatinine Ratio: 22 (ref 10–24)
BUN: 24 mg/dL (ref 8–27)
Bilirubin Total: 0.4 mg/dL (ref 0.0–1.2)
CO2: 21 mmol/L (ref 20–29)
Calcium: 9.8 mg/dL (ref 8.6–10.2)
Chloride: 100 mmol/L (ref 96–106)
Creatinine, Ser: 1.07 mg/dL (ref 0.76–1.27)
GFR calc Af Amer: 85 mL/min/{1.73_m2} (ref >59)
GFR calc non Af Amer: 73 mL/min/{1.73_m2} (ref >59)
GLOBULIN, TOTAL: 2.6 g/dL (ref 1.5–4.5)
Glucose: 164 mg/dL — ABNORMAL HIGH (ref 65–99)
POTASSIUM: 4.4 mmol/L (ref 3.5–5.2)
SODIUM: 138 mmol/L (ref 134–144)
Total Protein: 7.3 g/dL (ref 6.0–8.5)

## 2017-01-31 LAB — BAYER DCA HB A1C WAIVED: HB A1C: 6.8 % (ref <7.0)

## 2017-01-31 NOTE — Patient Instructions (Signed)

## 2017-01-31 NOTE — Progress Notes (Signed)
Subjective:    Patient ID: Arthur Torres, male    DOB: 04/14/1951, 66 y.o.   MRN: [id number]  HPI Arthur Torres is here today for follow up of chronic medical problem.  Outpatient Encounter Prescriptions as of 01/31/2017  Medication Sig  . aspirin 325 MG EC tablet Take 325 mg by mouth daily.  . metFORMIN (GLUCOPHAGE) 1000 MG tablet Take 1 tablet (1,000 mg total) by mouth 2 (two) times daily with a meal.  . metoprolol succinate (TOPROL-XL) 50 MG 24 hr tablet 1 PO QD  . rosuvastatin (CRESTOR) 10 MG tablet Take 1 tablet (10 mg total) by mouth daily.   No facility-administered encounter medications on file as of 01/31/2017.     1. Type 2 diabetes mellitus without complication, without long-term current use of insulin ([place]) last hgba1cc was 6.9%. doaes not check blood sugars everyday- when does check thay are running in 120's fasting. No hypoglycemia  2. Hyperlipidemia with target LDL less than 100  Tries to avoid fried foods  3. Essential hypertension, benign  No c/o chest pain, SOB or headache- does not check blod pressures at home  4. Seasonal allergic rhinitis due to pollen  Use zyrtec- works well  5. BMI 27.0-27.9,adult  No recent weight changes  6. History of right coronary artery stent placement  Sees cardiology yearly- again no c/o chest pain or SOB    New complaints: None today  Social history:    Review of Systems  Constitutional: Negative for activity change and appetite change.  HENT: Negative.   Eyes: Negative for pain.  Respiratory: Negative for shortness of breath.   Cardiovascular: Negative for chest pain, palpitations and leg swelling.  Gastrointestinal: Negative for abdominal pain.  Endocrine: Negative for polydipsia.  Genitourinary: Negative.   Skin: Negative for rash.  Neurological: Negative for dizziness, weakness and headaches.  Hematological: Does not bruise/bleed easily.  Psychiatric/Behavioral: Negative.   All other systems reviewed and are  negative.      Objective:   Physical Exam  Constitutional: He is oriented to person, place, and time. He appears well-developed and well-nourished.  HENT:  Head: Normocephalic.  Right Ear: External ear normal.  Left Ear: External ear normal.  Nose: Nose normal.  Mouth/Throat: Oropharynx is clear and moist.  Eyes: Pupils are equal, round, and reactive to light. EOM are normal.  Neck: Normal range of motion. Neck supple. No JVD present. No thyromegaly present.  Cardiovascular: Normal rate, regular rhythm, normal heart sounds and intact distal pulses.  Exam reveals no gallop and no friction rub.   No murmur heard. Pulmonary/Chest: Effort normal and breath sounds normal. No respiratory distress. He has no wheezes. He has no rales. He exhibits no tenderness.  Abdominal: Soft. Bowel sounds are normal. He exhibits no mass. There is no tenderness.  Musculoskeletal: Normal range of motion. He exhibits no edema.  Lymphadenopathy:    He has no cervical adenopathy.  Neurological: He is alert and oriented to person, place, and time. No cranial nerve deficit.  Skin: Skin is warm and dry.  Psychiatric: He has a normal mood and affect. His behavior is normal. Judgment and thought content normal.   BP 114/69   Pulse 71   Temp (!) 97.3 F (36.3 C) (Oral)   Ht 5' 10"  (1.778 m)   Wt 192 lb (87.1 kg)   BMI 27.55 kg/m   hgba1c 6.8%    Assessment & Plan:  1. Type 2 diabetes mellitus without complication, without long-term current use  of insulin (Arthur Torres) Continue to watch carbs in diet - Bayer DCA Hb A1c Waived  2. Hyperlipidemia with target LDL less than 100 Low fat diet - Lipid panel  3. Essential hypertension, benign Low sodium diet - CMP14+EGFR  4. Seasonal allergic rhinitis due to pollen  5. BMI 27.0-27.9,adult Discussed diet and exercise for person with BMI >25 Will recheck weight in 3-6 months  6. History of right coronary artery stent placement Keep follow up appointments with  cardiology   Encouraged to get eye exam Labs pending Health maintenance reviewed Diet and exercise encouraged Continue all meds Follow up  In 3 months   [place], FNP

## 2017-02-15 IMAGING — CT CT RENAL STONE PROTOCOL
2 of 4 series · 16 of 46 positions shown, 18 images · non-contrast
Comparison: None.

CLINICAL DATA: Left flank pain beginning today at noon.

EXAM:
CT ABDOMEN AND PELVIS WITHOUT CONTRAST
TECHNIQUE: Multidetector CT imaging of the abdomen and pelvis was performed
following the standard protocol without IV contrast.

[Series 2: standard/full over (age)lbs 5.0 · axial · 0.74mm/px · z∈[-524,-70]mm · 13 of 101 slices shown, 15 images]
[im 5/101  soft-tissue]
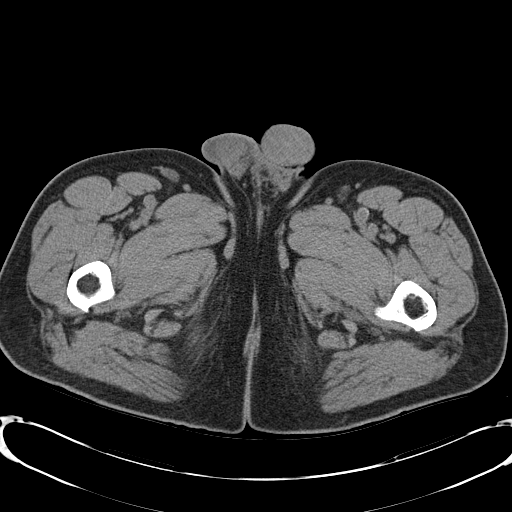
[im 5/101  bone]
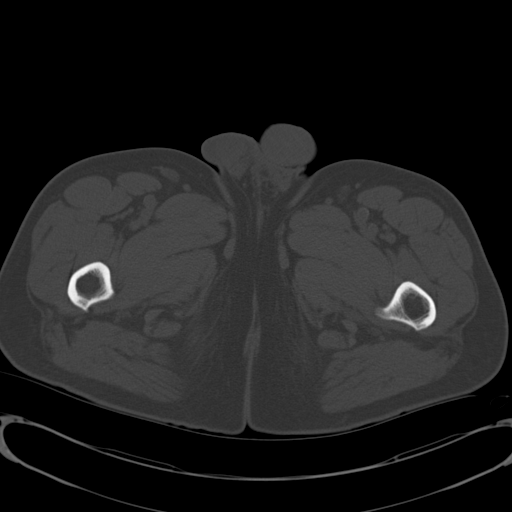
[im 13/101  soft-tissue]
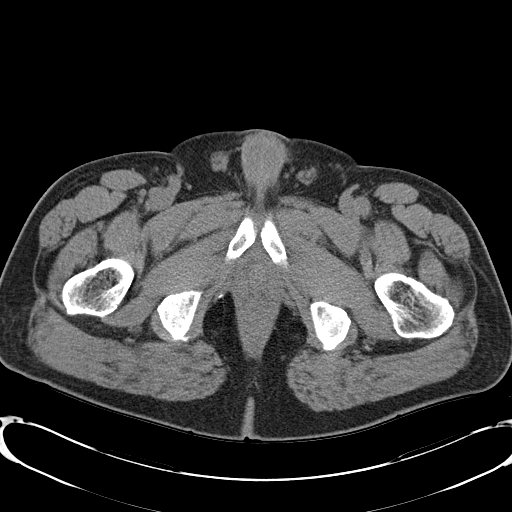
[im 21/101  soft-tissue]
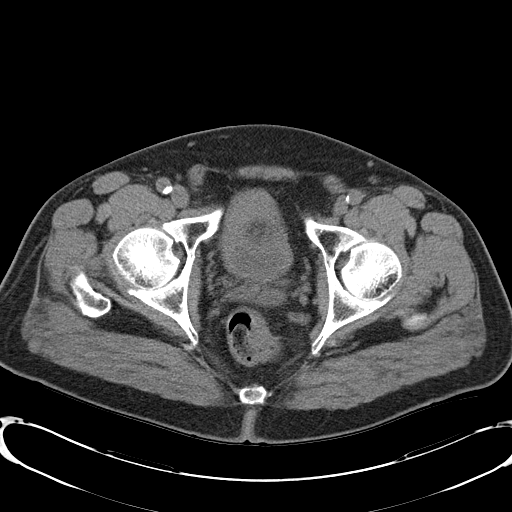
[im 30/101  soft-tissue]
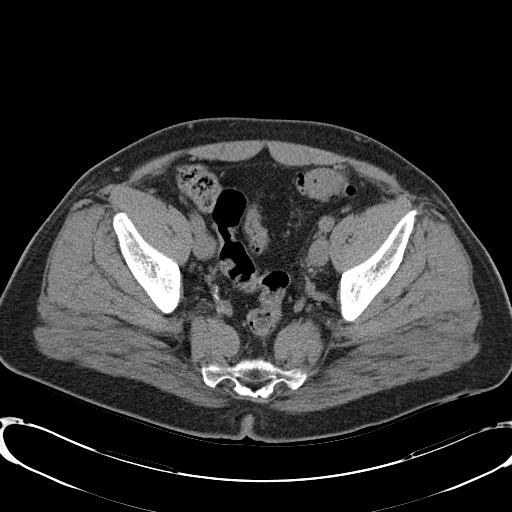
[im 34/101  soft-tissue]
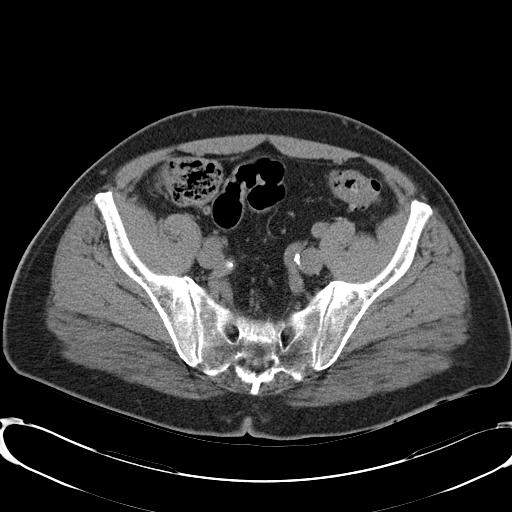
[im 42/101  soft-tissue]
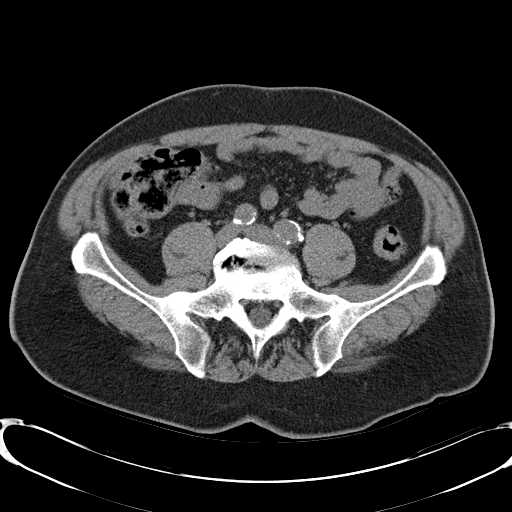
[im 51/101  soft-tissue]
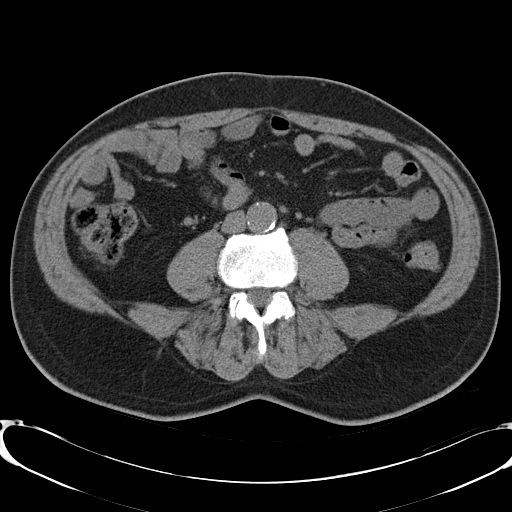
[im 59/101  soft-tissue]
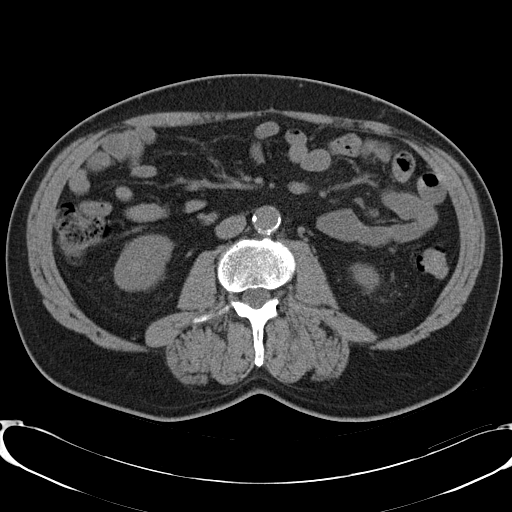
[im 67/101  soft-tissue]
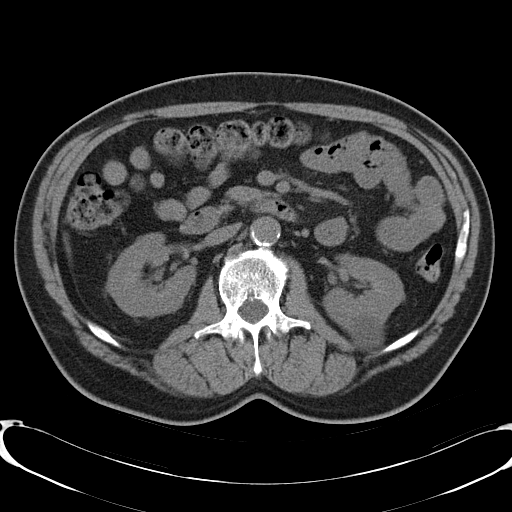
[im 67/101  bone]
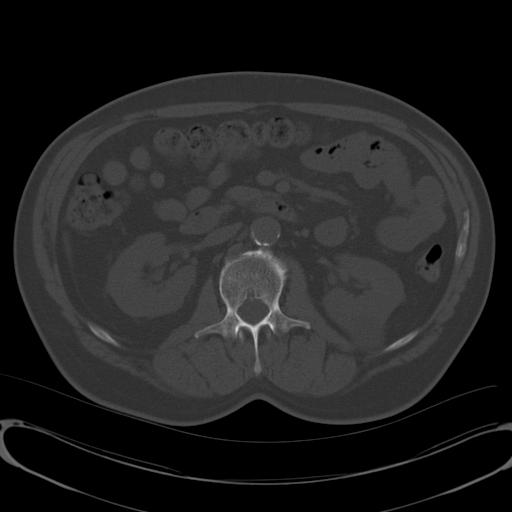
[im 71/101  soft-tissue]
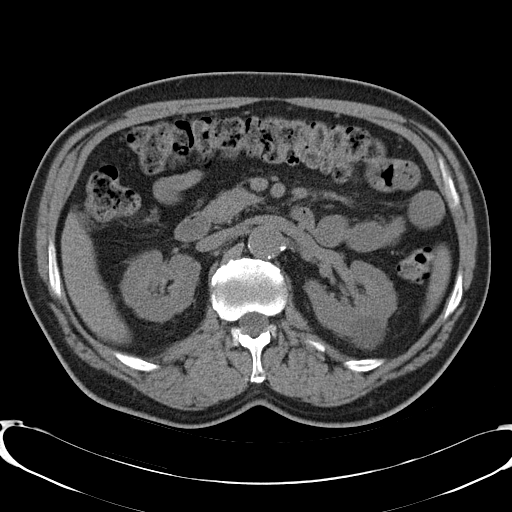
[im 80/101  soft-tissue]
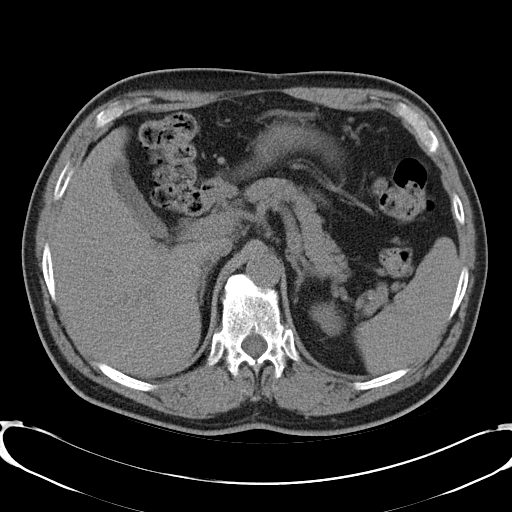
[im 88/101  soft-tissue]
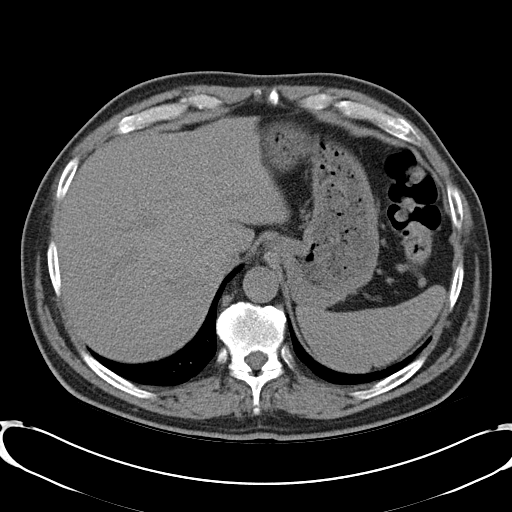
[im 96/101  soft-tissue]
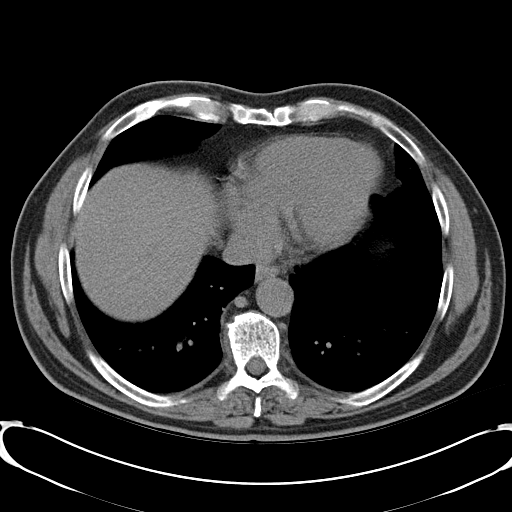

[Series 4: mpr coronal · coronal · 0.76mm/px · 3 of 92 slices shown]
[im 31/92  soft-tissue]
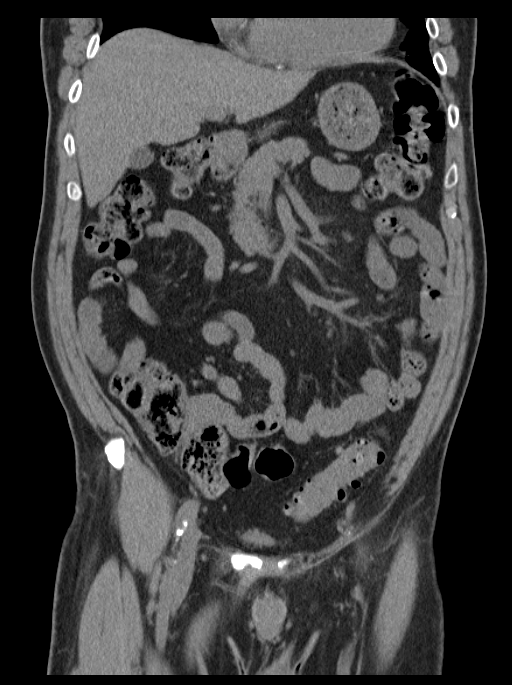
[im 41/92  soft-tissue]
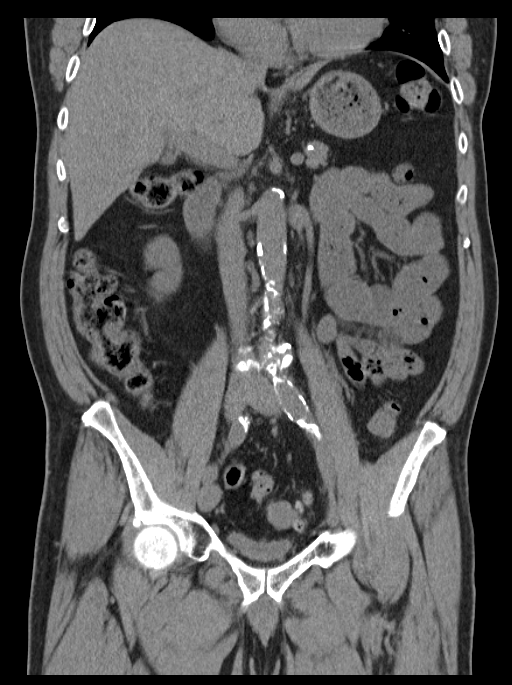
[im 51/92  soft-tissue]
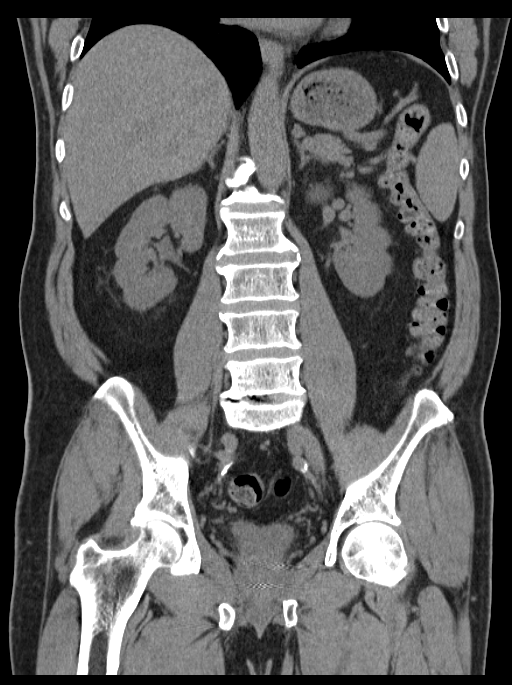

[16 of 46 positions shown; findings below may reference images not displayed]

FINDINGS: There is some atelectatic change in the lung bases. No pleural or
pericardial effusion. Calcific coronary artery disease is noted.

No renal or ureteral stones are seen on the right or left. A 3.3 cm
simple left renal cyst is noted. The kidneys are otherwise
unremarkable. The gallbladder, liver, spleen, adrenal glands and
pancreas appear normal. Aortoiliac atherosclerosis without aneurysm
is identified. Sigmoid diverticulosis without diverticulitis is
seen. The colon is otherwise unremarkable. The stomach, small bowel
and appendix appear normal. There is no lymphadenopathy or fluid.

No lytic or sclerotic bony lesion is identified. Loss of disc space
height and endplate spurring are seen at L5-S1.
IMPRESSION: Negative for urinary tract stone. No acute abnormality or finding to
explain the patient's symptoms.

Diverticulosis without diverticulitis.

Calcific aortic and coronary atherosclerosis.

## 2017-02-15 IMAGING — CR DG LUMBAR SPINE 2-3V
2 series · 2 of 2 positions shown · non-contrast
Comparison: None.

CLINICAL DATA: Sudden onset of back pain.

EXAM:
LUMBAR SPINE - 2-3 VIEW

[view not recorded (1 of 2)]
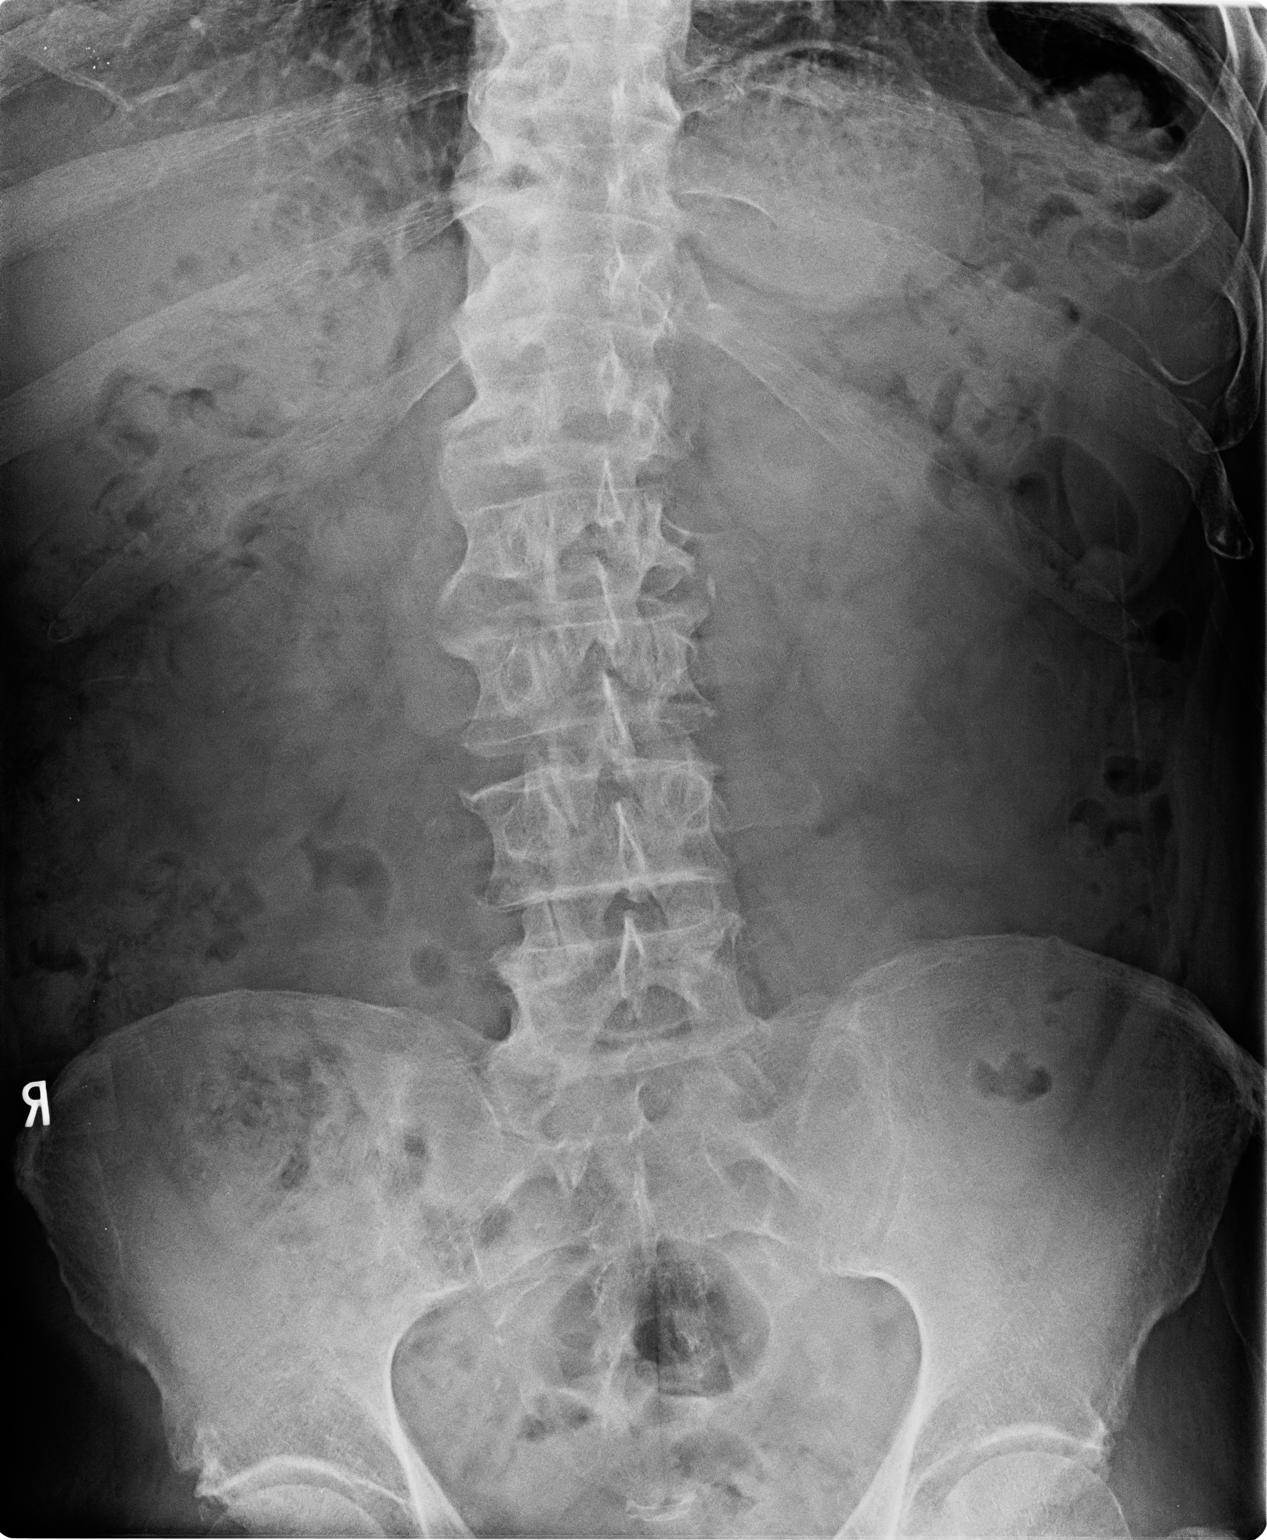

[view not recorded (2 of 2)]
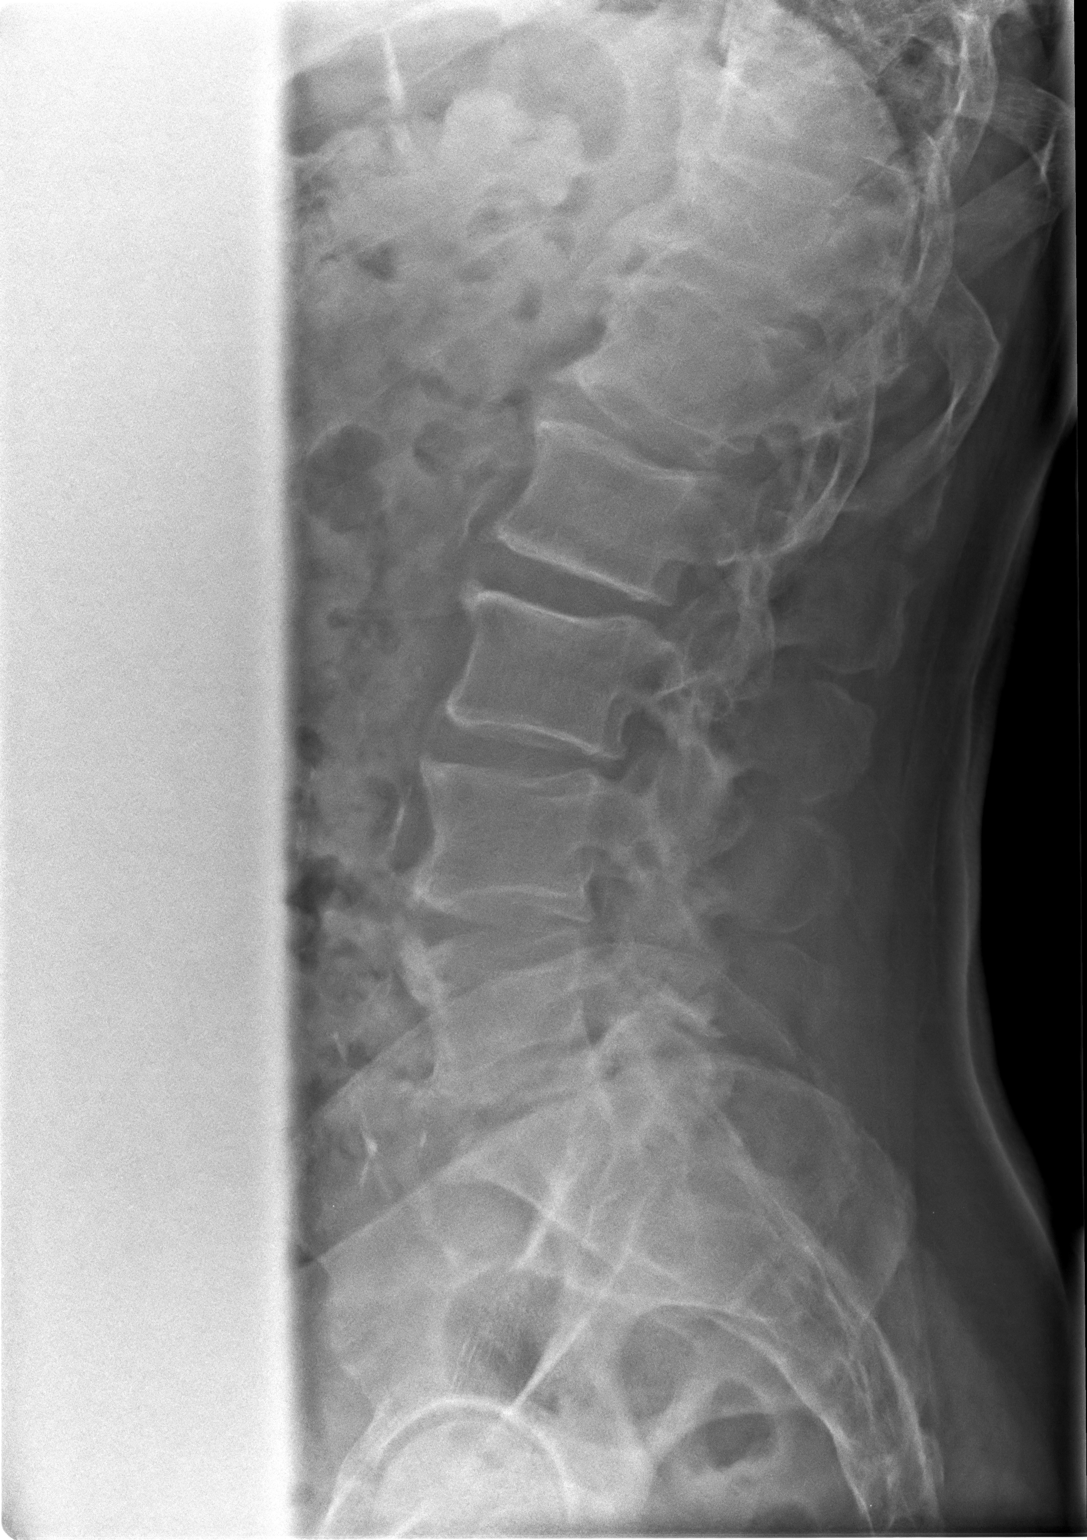

[2 of 2 positions shown; findings below may reference images not displayed]

FINDINGS: Degenerative spurring throughout the lumbar and visualized lower
thoracic spine. Disc space narrowing at L5-S1. Mild degenerative
facet disease in the lower lumbar spine at L4-5 and L5-S1. Normal
alignment. No fracture. SI joints are symmetric and unremarkable.
IMPRESSION: Degenerative changes.  No acute bony abnormality.

## 2017-02-21 ENCOUNTER — Encounter: Payer: Self-pay | Admitting: *Deleted

## 2017-05-03 ENCOUNTER — Encounter: Payer: Self-pay | Admitting: Nurse Practitioner

## 2017-05-03 ENCOUNTER — Ambulatory Visit (INDEPENDENT_AMBULATORY_CARE_PROVIDER_SITE_OTHER): Payer: BLUE CROSS/BLUE SHIELD | Admitting: Nurse Practitioner

## 2017-05-03 VITALS — BP 123/80 | HR 66 | Temp 97.5°F | Ht 70.0 in | Wt 199.0 lb

## 2017-05-03 DIAGNOSIS — Z23 Encounter for immunization: Secondary | ICD-10-CM | POA: Diagnosis not present

## 2017-05-03 DIAGNOSIS — Z955 Presence of coronary angioplasty implant and graft: Secondary | ICD-10-CM | POA: Diagnosis not present

## 2017-05-03 DIAGNOSIS — E119 Type 2 diabetes mellitus without complications: Secondary | ICD-10-CM | POA: Diagnosis not present

## 2017-05-03 DIAGNOSIS — I10 Essential (primary) hypertension: Secondary | ICD-10-CM

## 2017-05-03 DIAGNOSIS — Z6827 Body mass index (BMI) 27.0-27.9, adult: Secondary | ICD-10-CM

## 2017-05-03 DIAGNOSIS — E785 Hyperlipidemia, unspecified: Secondary | ICD-10-CM | POA: Diagnosis not present

## 2017-05-03 LAB — BAYER DCA HB A1C WAIVED: HB A1C (BAYER DCA - WAIVED): 7.2 % — ABNORMAL HIGH (ref <7.0)

## 2017-05-03 MED ORDER — METFORMIN HCL 1000 MG PO TABS: 1000.0000 mg | ORAL_TABLET | Freq: Two times a day (BID) | ORAL | Status: DC

## 2017-05-03 MED ORDER — ROSUVASTATIN CALCIUM 10 MG PO TABS: 10.0000 mg | ORAL_TABLET | Freq: Every day | ORAL | 1 refills | Status: DC

## 2017-05-03 MED ORDER — METOPROLOL SUCCINATE ER 50 MG PO TB24: ORAL_TABLET | ORAL | 1 refills | Status: DC

## 2017-05-03 NOTE — Patient Instructions (Signed)

## 2017-05-03 NOTE — Progress Notes (Signed)
 Subjective:    Patient ID: Arthur Torres, male    DOB: 07/14/1950, 66 y.o.   MRN: [id number]  HPI  Azir C Cinelli is here today for follow up of chronic medical problem.  Outpatient Encounter Medications as of 05/03/2017  Medication Sig  . aspirin 325 MG EC tablet Take 325 mg by mouth daily.  . metFORMIN (GLUCOPHAGE) 1000 MG tablet Take 1 tablet (1,000 mg total) by mouth 2 (two) times daily with a meal.  . metoprolol succinate (TOPROL-XL) 50 MG 24 hr tablet 1 PO QD  . rosuvastatin (CRESTOR) 10 MG tablet Take 1 tablet (10 mg total) by mouth daily.   No facility-administered encounter medications on file as of 05/03/2017.     1. Type 2 diabetes mellitus without complication, without long-term current use of insulin (HCC) last hgab1c was 6.8%. He does not check blood sugars at home- he says test strips are to expensive.  2. Hyperlipidemia with target LDL less than 100  Does not really watch diet other then trying to avoid fried foods  3. Essential hypertension, benign  No c/o chest pain , sob or headaches. Does not check bloodpressures at home. BP Readings from Last 3 Encounters:  05/03/17 123/80  01/31/17 114/69  10/26/16 107/66     4. BMI 27.0-27.9,adult  No weight changes  5. History of right coronary artery stent placement  Has not seen cardiology in 2-3 years- he say she feels fine.    New complaints: None   Social history: Works for [employer]    Review of Systems  Constitutional: Negative for activity change and appetite change.  HENT: Negative.   Eyes: Negative for pain.  Respiratory: Negative for shortness of breath.   Cardiovascular: Negative for chest pain, palpitations and leg swelling.  Gastrointestinal: Negative for abdominal pain.  Endocrine: Negative for polydipsia.  Genitourinary: Negative.   Skin: Negative for rash.  Neurological: Negative for dizziness, weakness and headaches.  Hematological: Does not bruise/bleed easily.    Psychiatric/Behavioral: Negative.   All other systems reviewed and are negative.      Objective:   Physical Exam  Constitutional: He is oriented to person, place, and time. He appears well-developed and well-nourished.  HENT:  Head: Normocephalic.  Right Ear: External ear normal.  Left Ear: External ear normal.  Nose: Nose normal.  Mouth/Throat: Oropharynx is clear and moist.  Eyes: EOM are normal. Pupils are equal, round, and reactive to light.  Neck: Normal range of motion. Neck supple. No JVD present. No thyromegaly present.  Cardiovascular: Normal rate, regular rhythm, normal heart sounds and intact distal pulses. Exam reveals no gallop and no friction rub.  No murmur heard. Pulmonary/Chest: Effort normal and breath sounds normal. No respiratory distress. He has no wheezes. He has no rales. He exhibits no tenderness.  Abdominal: Soft. Bowel sounds are normal. He exhibits no mass. There is no tenderness.  Genitourinary: Prostate normal and penis normal.  Musculoskeletal: Normal range of motion. He exhibits no edema.  Lymphadenopathy:    He has no cervical adenopathy.  Neurological: He is alert and oriented to person, place, and time. No cranial nerve deficit.  Skin: Skin is warm and dry.  Psychiatric: He has a normal mood and affect. His behavior is normal. Judgment and thought content normal.   BP 123/80   Pulse 66   Temp (!) 97.5 F (36.4 C) (Oral)   Ht 5' 10" (1.778 m)   Wt 199 lb (90.3 kg)   BMI 28.55 kg/m     hgba1c 7.2% today     Assessment & Plan:  1. Type 2 diabetes mellitus without complication, without long-term current use of insulin (HCC) Watch carbs in diet a littler better - Bayer DCA Hb A1c Waived - Microalbumin / creatinine urine ratio - metFORMIN (GLUCOPHAGE) 1000 MG tablet; Take 1 tablet (1,000 mg total) 2 (two) times daily with a meal by mouth.  Dispense: 180 tablet; Refill: 01  2. Hyperlipidemia with target LDL less than 100 Low fat diet - Lipid  panel - rosuvastatin (CRESTOR) 10 MG tablet; Take 1 tablet (10 mg total) daily by mouth.  Dispense: 90 tablet; Refill: 1  3. Essential hypertension, benign Watch sodium in diet - CMP14+EGFR - metoprolol succinate (TOPROL-XL) 50 MG 24 hr tablet; 1 PO QD  Dispense: 90 tablet; Refill: 1  4. BMI 27.0-27.9,adult Discussed diet and exercise for person with BMI >25 Will recheck weight in 3-6 months  5. History of right coronary artery stent placement Only needs to see cardiologist if having problems    Labs pending Health maintenance reviewed Diet and exercise encouraged Continue all meds Follow up  In 3 months   [place], FNP

## 2017-05-03 NOTE — Addendum Note (Signed)
Addended by: Rolena Infante on: 05/03/2017 03:37 PM   Modules accepted: Orders

## 2017-05-04 LAB — CMP14+EGFR
ALBUMIN: 4.4 g/dL (ref 3.6–4.8)
ALT: 26 IU/L (ref 0–44)
AST: 16 IU/L (ref 0–40)
Albumin/Globulin Ratio: 1.8 (ref 1.2–2.2)
Alkaline Phosphatase: 56 IU/L (ref 39–117)
BILIRUBIN TOTAL: 0.3 mg/dL (ref 0.0–1.2)
BUN / CREAT RATIO: 18 (ref 10–24)
BUN: 19 mg/dL (ref 8–27)
CALCIUM: 9.4 mg/dL (ref 8.6–10.2)
CO2: 24 mmol/L (ref 20–29)
Chloride: 97 mmol/L (ref 96–106)
Creatinine, Ser: 1.06 mg/dL (ref 0.76–1.27)
GFR, EST AFRICAN AMERICAN: 86 mL/min/{1.73_m2} (ref >59)
GFR, EST NON AFRICAN AMERICAN: 74 mL/min/{1.73_m2} (ref >59)
Globulin, Total: 2.5 g/dL (ref 1.5–4.5)
Glucose: 147 mg/dL — ABNORMAL HIGH (ref 65–99)
Potassium: 4.3 mmol/L (ref 3.5–5.2)
Sodium: 137 mmol/L (ref 134–144)
TOTAL PROTEIN: 6.9 g/dL (ref 6.0–8.5)

## 2017-05-04 LAB — LIPID PANEL
CHOL/HDL RATIO: 2.7 ratio (ref 0.0–5.0)
Cholesterol, Total: 111 mg/dL (ref 100–199)
HDL: 41 mg/dL (ref >39)
LDL CALC: 39 mg/dL (ref 0–99)
Triglycerides: 154 mg/dL — ABNORMAL HIGH (ref 0–149)
VLDL CHOLESTEROL CAL: 31 mg/dL (ref 5–40)

## 2017-05-04 LAB — MICROALBUMIN / CREATININE URINE RATIO
CREATININE, UR: 55.7 mg/dL
Microalb/Creat Ratio: <5.4 mg/g creat (ref 0.0–30.0)

## 2017-08-05 ENCOUNTER — Ambulatory Visit: Payer: BLUE CROSS/BLUE SHIELD | Admitting: Nurse Practitioner

## 2017-08-08 ENCOUNTER — Ambulatory Visit: Payer: BLUE CROSS/BLUE SHIELD | Admitting: Nurse Practitioner

## 2017-08-08 ENCOUNTER — Encounter: Payer: Self-pay | Admitting: Nurse Practitioner

## 2017-08-08 VITALS — BP 108/62 | HR 57 | Temp 97.1°F | Ht 70.0 in | Wt 199.0 lb

## 2017-08-08 DIAGNOSIS — E785 Hyperlipidemia, unspecified: Secondary | ICD-10-CM

## 2017-08-08 DIAGNOSIS — Z23 Encounter for immunization: Secondary | ICD-10-CM | POA: Diagnosis not present

## 2017-08-08 DIAGNOSIS — E119 Type 2 diabetes mellitus without complications: Secondary | ICD-10-CM

## 2017-08-08 DIAGNOSIS — Z125 Encounter for screening for malignant neoplasm of prostate: Secondary | ICD-10-CM

## 2017-08-08 DIAGNOSIS — I10 Essential (primary) hypertension: Secondary | ICD-10-CM

## 2017-08-08 DIAGNOSIS — Z1211 Encounter for screening for malignant neoplasm of colon: Secondary | ICD-10-CM

## 2017-08-08 DIAGNOSIS — Z1212 Encounter for screening for malignant neoplasm of rectum: Secondary | ICD-10-CM

## 2017-08-08 DIAGNOSIS — Z6827 Body mass index (BMI) 27.0-27.9, adult: Secondary | ICD-10-CM | POA: Diagnosis not present

## 2017-08-08 LAB — BAYER DCA HB A1C WAIVED: HB A1C (BAYER DCA - WAIVED): 6.9 % (ref <7.0)

## 2017-08-08 MED ORDER — METOPROLOL SUCCINATE ER 50 MG PO TB24: ORAL_TABLET | ORAL | 1 refills | Status: DC

## 2017-08-08 MED ORDER — METFORMIN HCL 1000 MG PO TABS: 1000.0000 mg | ORAL_TABLET | Freq: Two times a day (BID) | ORAL | Status: DC

## 2017-08-08 MED ORDER — ROSUVASTATIN CALCIUM 10 MG PO TABS: 10.0000 mg | ORAL_TABLET | Freq: Every day | ORAL | 1 refills | Status: DC

## 2017-08-08 NOTE — Patient Instructions (Signed)

## 2017-08-08 NOTE — Progress Notes (Signed)
Subjective:    Patient ID: Arthur Torres, male    DOB: September 20, 1950, 67 y.o.   MRN: [id number]  HPI  Arthur Torres is here today for follow up of chronic medical problem.  Outpatient Encounter Medications as of 08/08/2017  Medication Sig  . aspirin 325 MG EC tablet Take 325 mg by mouth daily.  . metFORMIN (GLUCOPHAGE) 1000 MG tablet Take 1 tablet (1,000 mg total) 2 (two) times daily with a meal by mouth.  . metoprolol succinate (TOPROL-XL) 50 MG 24 hr tablet 1 PO QD  . rosuvastatin (CRESTOR) 10 MG tablet Take 1 tablet (10 mg total) daily by mouth.     1. Type 2 diabetes mellitus without complication, without long-term current use of insulin (HCC) hgba1c 6.9 %. He does not check blood sugars at home.  2. BMI 27.0-27.9,adult  No recent weight changes  3. Hyperlipidemia with target LDL less than 100  Says that he really doe snot watch diet  4. Essential hypertension, benign  No c/o chest pain, sob or headache.    New complaints: None today  Social history: Lives with wife- still working at [employer] in the body [place]: Negative for activity change and appetite change.  HENT: Negative.   Eyes: Negative for pain.  Respiratory: Negative for shortness of breath.   Cardiovascular: Negative for chest pain, palpitations and leg swelling.  Gastrointestinal: Negative for abdominal pain.  Endocrine: Negative for polydipsia.  Genitourinary: Negative.   Skin: Negative for rash.  Neurological: Negative for dizziness, weakness and headaches.  Hematological: Does not bruise/bleed easily.  Psychiatric/Behavioral: Negative.   All other systems reviewed and are negative.      Objective:   Physical Exam  Constitutional: He is oriented to person, place, and time. He appears well-developed and well-nourished.  HENT:  Head: Normocephalic.  Right Ear: External ear normal.  Left Ear: External ear normal.  Nose: Nose normal.  Mouth/Throat:  Oropharynx is clear and moist.  Eyes: EOM are normal. Pupils are equal, round, and reactive to light.  Neck: Normal range of motion. Neck supple. No JVD present. No thyromegaly present.  Cardiovascular: Normal rate, regular rhythm, normal heart sounds and intact distal pulses. Exam reveals no gallop and no friction rub.  No murmur heard. Pulmonary/Chest: Effort normal and breath sounds normal. No respiratory distress. He has no wheezes. He has no rales. He exhibits no tenderness.  Abdominal: Soft. Bowel sounds are normal. He exhibits no mass. There is no tenderness.  Genitourinary: Prostate normal and penis normal.  Musculoskeletal: Normal range of motion. He exhibits no edema.  Lymphadenopathy:    He has no cervical adenopathy.  Neurological: He is alert and oriented to person, place, and time. No cranial nerve deficit.  Skin: Skin is warm and dry.  Psychiatric: He has a normal mood and affect. His behavior is normal. Judgment and thought content normal.   BP 108/62   Pulse (!) 57   Temp (!) 97.1 F (36.2 C) (Oral)   Ht 5' 10"  (1.778 m)   Wt 199 lb (90.3 kg)   BMI 28.55 kg/m   hgba1c 6.9% today    Assessment & Plan:  1. Type 2 diabetes mellitus without complication, without long-term current use of insulin (HCC) continue to watch carbs in diet - Bayer DCA Hb A1c Waived - metFORMIN (GLUCOPHAGE) 1000 MG tablet; Take 1 tablet (1,000 mg total) by mouth 2 (two) times daily with a meal.  Dispense: 180  tablet; Refill: 01  2. BMI 27.0-27.9,adult Discussed diet and exercise for person with BMI >25 Will recheck weight in 3-6 months  3. Hyperlipidemia with target LDL less than 100 Low fat diet  - Lipid panel - rosuvastatin (CRESTOR) 10 MG tablet; Take 1 tablet (10 mg total) by mouth daily.  Dispense: 90 tablet; Refill: 1  4. Essential hypertension, benign Low sodium diet - CMP14+EGFR - metoprolol succinate (TOPROL-XL) 50 MG 24 hr tablet; 1 PO QD  Dispense: 90 tablet; Refill: 1  5.  Prostate cancer screening - PSA, total and free  6. Screening for colorectal cancer - Fecal occult blood, imunochemical; Future    Labs pending Health maintenance reviewed Diet and exercise encouraged Continue all meds Follow up  In 3 months   [place], FNP

## 2017-08-08 NOTE — Addendum Note (Signed)
Addended by: Rolena Infante on: 08/08/2017 04:06 PM   Modules accepted: Orders

## 2017-08-09 ENCOUNTER — Other Ambulatory Visit: Payer: BLUE CROSS/BLUE SHIELD

## 2017-08-09 DIAGNOSIS — Z1212 Encounter for screening for malignant neoplasm of rectum: Principal | ICD-10-CM

## 2017-08-09 DIAGNOSIS — Z1211 Encounter for screening for malignant neoplasm of colon: Secondary | ICD-10-CM

## 2017-08-09 LAB — PSA, TOTAL AND FREE

## 2017-08-09 LAB — CMP14+EGFR
ALBUMIN: 4.3 g/dL (ref 3.6–4.8)
ALT: 28 IU/L (ref 0–44)
AST: 19 IU/L (ref 0–40)
Albumin/Globulin Ratio: 1.5 (ref 1.2–2.2)
Alkaline Phosphatase: 61 IU/L (ref 39–117)
BUN / CREAT RATIO: 14 (ref 10–24)
BUN: 17 mg/dL (ref 8–27)
Bilirubin Total: 0.3 mg/dL (ref 0.0–1.2)
CALCIUM: 9.3 mg/dL (ref 8.6–10.2)
CO2: 18 mmol/L — ABNORMAL LOW (ref 20–29)
Chloride: 100 mmol/L (ref 96–106)
Creatinine, Ser: 1.24 mg/dL (ref 0.76–1.27)
GFR, EST AFRICAN AMERICAN: 71 mL/min/{1.73_m2} (ref >59)
GFR, EST NON AFRICAN AMERICAN: 61 mL/min/{1.73_m2} (ref >59)
GLOBULIN, TOTAL: 2.9 g/dL (ref 1.5–4.5)
Glucose: 159 mg/dL — ABNORMAL HIGH (ref 65–99)
Potassium: 4.2 mmol/L (ref 3.5–5.2)
SODIUM: 140 mmol/L (ref 134–144)
TOTAL PROTEIN: 7.2 g/dL (ref 6.0–8.5)

## 2017-08-09 LAB — LIPID PANEL
CHOL/HDL RATIO: 3 ratio (ref 0.0–5.0)
Cholesterol, Total: 120 mg/dL (ref 100–199)
HDL: 40 mg/dL (ref >39)
LDL Calculated: 37 mg/dL (ref 0–99)
Triglycerides: 213 mg/dL — ABNORMAL HIGH (ref 0–149)
VLDL Cholesterol Cal: 43 mg/dL — ABNORMAL HIGH (ref 5–40)

## 2017-08-10 LAB — FECAL OCCULT BLOOD, IMMUNOCHEMICAL: Fecal Occult Bld: NEGATIVE

## 2017-08-18 LAB — HM DIABETES EYE EXAM

## 2017-11-07 ENCOUNTER — Ambulatory Visit: Payer: BLUE CROSS/BLUE SHIELD | Admitting: Nurse Practitioner

## 2017-11-07 ENCOUNTER — Encounter: Payer: Self-pay | Admitting: Nurse Practitioner

## 2017-11-07 VITALS — BP 118/73 | HR 58 | Temp 97.6°F | Ht 70.0 in | Wt 199.1 lb

## 2017-11-07 DIAGNOSIS — E785 Hyperlipidemia, unspecified: Secondary | ICD-10-CM | POA: Diagnosis not present

## 2017-11-07 DIAGNOSIS — Z6827 Body mass index (BMI) 27.0-27.9, adult: Secondary | ICD-10-CM

## 2017-11-07 DIAGNOSIS — E119 Type 2 diabetes mellitus without complications: Secondary | ICD-10-CM

## 2017-11-07 DIAGNOSIS — I10 Essential (primary) hypertension: Secondary | ICD-10-CM

## 2017-11-07 DIAGNOSIS — J301 Allergic rhinitis due to pollen: Secondary | ICD-10-CM | POA: Diagnosis not present

## 2017-11-07 LAB — BAYER DCA HB A1C WAIVED: HB A1C (BAYER DCA - WAIVED): 7.1 % — ABNORMAL HIGH (ref <7.0)

## 2017-11-07 MED ORDER — METOPROLOL SUCCINATE ER 50 MG PO TB24: ORAL_TABLET | ORAL | 1 refills | Status: DC

## 2017-11-07 MED ORDER — METFORMIN HCL 1000 MG PO TABS: 1000.0000 mg | ORAL_TABLET | Freq: Two times a day (BID) | ORAL | Status: DC

## 2017-11-07 MED ORDER — ROSUVASTATIN CALCIUM 10 MG PO TABS: 10.0000 mg | ORAL_TABLET | Freq: Every day | ORAL | 1 refills | Status: DC

## 2017-11-07 NOTE — Patient Instructions (Signed)

## 2017-11-07 NOTE — Progress Notes (Signed)
Subjective:    Patient ID: Arthur Torres, male    DOB: 1950-10-31, 67 y.o.   MRN: [id number]   Chief Complaint: Medical Management of Chronic Issues   HPI:  1. Type 2 diabetes mellitus without complication, without long-term current use of insulin (HCC) Last hgba1c 6.9%. He does not check his blood sugars.  2. Essential hypertension, benign  No c/o chest pain, sob or headache. Does not check blood pressure at home. BP Readings from Last 3 Encounters:  11/07/17 118/73  08/08/17 108/62  05/03/17 123/80     3. Hyperlipidemia with target LDL less than 100  Does not  Watch diet- no exercise  4. BMI 27.0-27.9,adult  No recent weight changes  5. Seasonal allergic rhinitis due to pollen  Says he is not having any issues except an occasional sneezing    Outpatient Encounter Medications as of 11/07/2017  Medication Sig  . aspirin 325 MG EC tablet Take 325 mg by mouth daily.  . metFORMIN (GLUCOPHAGE) 1000 MG tablet Take 1 tablet (1,000 mg total) by mouth 2 (two) times daily with a meal.  . metoprolol succinate (TOPROL-XL) 50 MG 24 hr tablet 1 PO QD  . rosuvastatin (CRESTOR) 10 MG tablet Take 1 tablet (10 mg total) by mouth daily.      New complaints: None today  Social history: Is a [employer] scheme manager   Review of Systems  Constitutional: Negative for activity change and appetite change.  HENT: Negative.   Eyes: Negative for pain.  Respiratory: Negative for shortness of breath.   Cardiovascular: Negative for chest pain, palpitations and leg swelling.  Gastrointestinal: Negative for abdominal pain.  Endocrine: Negative for polydipsia.  Genitourinary: Negative.   Skin: Negative for rash.  Neurological: Negative for dizziness, weakness and headaches.  Hematological: Does not bruise/bleed easily.  Psychiatric/Behavioral: Negative.   All other systems reviewed and are negative.      Objective:   Physical Exam  Constitutional: He is oriented to person, place, and time.    HENT:  Head: Normocephalic.  Nose: Nose normal.  Mouth/Throat: Oropharynx is clear and moist.  Eyes: Pupils are equal, round, and reactive to light. EOM are normal.  Neck: Normal range of motion and phonation normal. Neck supple. No JVD present. Carotid bruit is not present. No thyroid mass and no thyromegaly present.  Cardiovascular: Normal rate and regular rhythm.  Pulmonary/Chest: Effort normal and breath sounds normal. No respiratory distress.  Abdominal: Soft. Normal appearance, normal aorta and bowel sounds are normal. There is no tenderness.  Musculoskeletal: Normal range of motion.  Lymphadenopathy:    He has no cervical adenopathy.  Neurological: He is alert and oriented to person, place, and time.  Skin: Skin is warm and dry.  Psychiatric: Judgment normal.   BP 118/73   Pulse (!) 58   Temp 97.6 F (36.4 C) (Oral)   Ht 5\' 10"  (1.778 m)   Wt 199 lb 2 oz (90.3 kg)   BMI 28.57 kg/m   hgba1c 7.1%     Assessment & Plan:  Arthur Torres comes in today with chief complaint of Medical Management of Chronic Issues   Diagnosis and orders addressed:  1. Type 2 diabetes mellitus without complication, without long-term current use of insulin (HCC) Continue to watch carbs in diet - Bayer DCA Hb A1c Waived - metFORMIN (GLUCOPHAGE) 1000 MG tablet; Take 1 tablet (1,000 mg total) by mouth 2 (two) times daily with a meal.  Dispense: 180 tablet; Refill: 01  2. Essential  hypertension, benign Low sodium diet - metoprolol succinate (TOPROL-XL) 50 MG 24 hr tablet; 1 PO QD  Dispense: 90 tablet; Refill: 1  3. Hyperlipidemia with target LDL less than 100 Low fat diet - rosuvastatin (CRESTOR) 10 MG tablet; Take 1 tablet (10 mg total) by mouth daily.  Dispense: 90 tablet; Refill: 1  4. BMI 27.0-27.9,adult Discussed diet and exercise for person with BMI >25 Will recheck weight in 3-6 months  5. Seasonal allergic rhinitis due to pollen Avoid allergens Wear mask when mowing th  egrass   Labs pending Health Maintenance reviewed Diet and exercise encouraged  Follow up plan: 3 months   [name] [place], FNP

## 2018-02-09 ENCOUNTER — Encounter: Payer: Self-pay | Admitting: Nurse Practitioner

## 2018-02-09 ENCOUNTER — Ambulatory Visit: Payer: BLUE CROSS/BLUE SHIELD | Admitting: Nurse Practitioner

## 2018-02-09 VITALS — BP 125/77 | HR 61 | Temp 97.1°F | Ht 70.0 in | Wt 196.0 lb

## 2018-02-09 DIAGNOSIS — I10 Essential (primary) hypertension: Secondary | ICD-10-CM | POA: Diagnosis not present

## 2018-02-09 DIAGNOSIS — Z6827 Body mass index (BMI) 27.0-27.9, adult: Secondary | ICD-10-CM

## 2018-02-09 DIAGNOSIS — E119 Type 2 diabetes mellitus without complications: Secondary | ICD-10-CM

## 2018-02-09 DIAGNOSIS — E785 Hyperlipidemia, unspecified: Secondary | ICD-10-CM

## 2018-02-09 LAB — LIPID PANEL
CHOL/HDL RATIO: 2.7 ratio (ref 0.0–5.0)
Cholesterol, Total: 106 mg/dL (ref 100–199)
HDL: 39 mg/dL — ABNORMAL LOW (ref >39)
LDL Calculated: 41 mg/dL (ref 0–99)
Triglycerides: 131 mg/dL (ref 0–149)
VLDL CHOLESTEROL CAL: 26 mg/dL (ref 5–40)

## 2018-02-09 LAB — CMP14+EGFR
A/G RATIO: 1.8 (ref 1.2–2.2)
ALBUMIN: 4.6 g/dL (ref 3.6–4.8)
ALT: 29 IU/L (ref 0–44)
AST: 21 IU/L (ref 0–40)
Alkaline Phosphatase: 68 IU/L (ref 39–117)
BUN/Creatinine Ratio: 16 (ref 10–24)
BUN: 22 mg/dL (ref 8–27)
Bilirubin Total: 0.4 mg/dL (ref 0.0–1.2)
CALCIUM: 9.4 mg/dL (ref 8.6–10.2)
CO2: 20 mmol/L (ref 20–29)
Chloride: 103 mmol/L (ref 96–106)
Creatinine, Ser: 1.37 mg/dL — ABNORMAL HIGH (ref 0.76–1.27)
GFR, EST AFRICAN AMERICAN: 63 mL/min/{1.73_m2} (ref >59)
GFR, EST NON AFRICAN AMERICAN: 54 mL/min/{1.73_m2} — AB (ref >59)
Globulin, Total: 2.6 g/dL (ref 1.5–4.5)
Glucose: 146 mg/dL — ABNORMAL HIGH (ref 65–99)
POTASSIUM: 4.2 mmol/L (ref 3.5–5.2)
Sodium: 139 mmol/L (ref 134–144)
TOTAL PROTEIN: 7.2 g/dL (ref 6.0–8.5)

## 2018-02-09 LAB — BAYER DCA HB A1C WAIVED: HB A1C: 7.5 % — AB (ref <7.0)

## 2018-02-09 NOTE — Patient Instructions (Signed)
Diabetes Mellitus and Nutrition When you have diabetes (diabetes mellitus), it is very important to have healthy eating habits because your blood sugar (glucose) levels are greatly affected by what you eat and drink. Eating healthy foods in the appropriate amounts, at about the same times every day, can help you:  Control your blood glucose.  Lower your risk of heart disease.  Improve your blood pressure.  Reach or maintain a healthy weight.  Every person with diabetes is different, and each person has different needs for a meal plan. Your health care provider may recommend that you work with a diet and nutrition specialist (dietitian) to make a meal plan that is best for you. Your meal plan may vary depending on factors such as:  The calories you need.  The medicines you take.  Your weight.  Your blood glucose, blood pressure, and cholesterol levels.  Your activity level.  Other health conditions you have, such as heart or kidney disease.  How do carbohydrates affect me? Carbohydrates affect your blood glucose level more than any other type of food. Eating carbohydrates naturally increases the amount of glucose in your blood. Carbohydrate counting is a method for keeping track of how many carbohydrates you eat. Counting carbohydrates is important to keep your blood glucose at a healthy level, especially if you use insulin or take certain oral diabetes medicines. It is important to know how many carbohydrates you can safely have in each meal. This is different for every person. Your dietitian can help you calculate how many carbohydrates you should have at each meal and for snack. Foods that contain carbohydrates include:  Bread, cereal, rice, pasta, and crackers.  Potatoes and corn.  Peas, beans, and lentils.  Milk and yogurt.  Fruit and juice.  Desserts, such as cakes, cookies, ice cream, and candy.  How does alcohol affect me? Alcohol can cause a sudden decrease in blood  glucose (hypoglycemia), especially if you use insulin or take certain oral diabetes medicines. Hypoglycemia can be a life-threatening condition. Symptoms of hypoglycemia (sleepiness, dizziness, and confusion) are similar to symptoms of having too much alcohol. If your health care provider says that alcohol is safe for you, follow these guidelines:  Limit alcohol intake to no more than 1 drink per day for nonpregnant women and 2 drinks per day for men. One drink equals 12 oz of beer, 5 oz of wine, or 1 oz of hard liquor.  Do not drink on an empty stomach.  Keep yourself hydrated with water, diet soda, or unsweetened iced tea.  Keep in mind that regular soda, juice, and other mixers may contain a lot of sugar and must be counted as carbohydrates.  What are tips for following this plan? Reading food labels  Start by checking the serving size on the label. The amount of calories, carbohydrates, fats, and other nutrients listed on the label are based on one serving of the food. Many foods contain more than one serving per package.  Check the total grams (g) of carbohydrates in one serving. You can calculate the number of servings of carbohydrates in one serving by dividing the total carbohydrates by 15. For example, if a food has 30 g of total carbohydrates, it would be equal to 2 servings of carbohydrates.  Check the number of grams (g) of saturated and trans fats in one serving. Choose foods that have low or no amount of these fats.  Check the number of milligrams (mg) of sodium in one serving. Most people   should limit total sodium intake to less than 2,300 mg per day.  Always check the nutrition information of foods labeled as "low-fat" or "nonfat". These foods may be higher in added sugar or refined carbohydrates and should be avoided.  Talk to your dietitian to identify your daily goals for nutrients listed on the label. Shopping  Avoid buying canned, premade, or processed foods. These  foods tend to be high in fat, sodium, and added sugar.  Shop around the outside edge of the grocery store. This includes fresh fruits and vegetables, bulk grains, fresh meats, and fresh dairy. Cooking  Use low-heat cooking methods, such as baking, instead of high-heat cooking methods like deep frying.  Cook using healthy oils, such as olive, canola, or sunflower oil.  Avoid cooking with butter, cream, or high-fat meats. Meal planning  Eat meals and snacks regularly, preferably at the same times every day. Avoid going long periods of time without eating.  Eat foods high in fiber, such as fresh fruits, vegetables, beans, and whole grains. Talk to your dietitian about how many servings of carbohydrates you can eat at each meal.  Eat 4-6 ounces of lean protein each day, such as lean meat, chicken, fish, eggs, or tofu. 1 ounce is equal to 1 ounce of meat, chicken, or fish, 1 egg, or 1/4 cup of tofu.  Eat some foods each day that contain healthy fats, such as avocado, nuts, seeds, and fish. Lifestyle   Check your blood glucose regularly.  Exercise at least 30 minutes 5 or more days each week, or as told by your health care provider.  Take medicines as told by your health care provider.  Do not use any products that contain nicotine or tobacco, such as cigarettes and e-cigarettes. If you need help quitting, ask your health care provider.  Work with a counselor or diabetes educator to identify strategies to manage stress and any emotional and social challenges. What are some questions to ask my health care provider?  Do I need to meet with a diabetes educator?  Do I need to meet with a dietitian?  What number can I call if I have questions?  When are the best times to check my blood glucose? Where to find more information:  American Diabetes Association: diabetes.org/food-and-fitness/food  Academy of Nutrition and Dietetics:  www.eatright.org/resources/health/diseases-and-conditions/diabetes  National Institute of Diabetes and Digestive and Kidney Diseases (NIH): www.niddk.nih.gov/health-information/diabetes/overview/diet-eating-physical-activity Summary  A healthy meal plan will help you control your blood glucose and maintain a healthy lifestyle.  Working with a diet and nutrition specialist (dietitian) can help you make a meal plan that is best for you.  Keep in mind that carbohydrates and alcohol have immediate effects on your blood glucose levels. It is important to count carbohydrates and to use alcohol carefully. This information is not intended to replace advice given to you by your health care provider. Make sure you discuss any questions you have with your health care provider. Document Released: 03/11/2005 Document Revised: 07/19/2016 Document Reviewed: 07/19/2016 Elsevier Interactive Patient Education  2018 Elsevier Inc.  

## 2018-02-09 NOTE — Progress Notes (Signed)
Subjective:    Patient ID: Arthur Torres, male    DOB: [birth year], 67 y.o.   MRN: [id number]   Chief Complaint: Medical Management of Chronic Issues   HPI:  1. Type 2 diabetes mellitus without complication, without long-term current use of insulin ([place]) last hgba1c was 7.1%. Blood sugars are not check ever at home.   2. Essential hypertension, benign  No sob , chest pain or headaches. Does not check blood pressure at home. BP Readings from Last 3 Encounters:  02/09/18 125/77  11/07/17 118/73  08/08/17 108/62     3. Hyperlipidemia with target LDL less than 100  Does not watch diet and does no exercise.  4. BMI 27.0-27.9,adult  Weight down 3 lbs    Outpatient Encounter Medications as of 02/09/2018  Medication Sig  . aspirin 325 MG EC tablet Take 325 mg by mouth daily.  . metFORMIN (GLUCOPHAGE) 1000 MG tablet Take 1 tablet (1,000 mg total) by mouth 2 (two) times daily with a meal.  . metoprolol succinate (TOPROL-XL) 50 MG 24 hr tablet 1 PO QD  . rosuvastatin (CRESTOR) 10 MG tablet Take 1 tablet (10 mg total) by mouth daily.      New complaints: None today  Social history: Owns own repair shop   Review of Systems  Constitutional: Negative for activity change and appetite change.  HENT: Negative.   Eyes: Negative for pain.  Respiratory: Negative for shortness of breath.   Cardiovascular: Negative for chest pain, palpitations and leg swelling.  Gastrointestinal: Negative for abdominal pain.  Endocrine: Negative for polydipsia.  Genitourinary: Negative.   Skin: Negative for rash.  Neurological: Negative for dizziness, weakness and headaches.  Hematological: Does not bruise/bleed easily.  Psychiatric/Behavioral: Negative.   All other systems reviewed and are negative.      Objective:   Physical Exam  Constitutional: He is oriented to person, place, and time. He appears well-developed and well-nourished. No distress.  HENT:  Head: Normocephalic.  Nose: Nose  normal.  Mouth/Throat: Oropharynx is clear and moist.  Eyes: Pupils are equal, round, and reactive to light. EOM are normal.  Neck: Normal range of motion and phonation normal. Neck supple. No JVD present. Carotid bruit is not present. No thyroid mass and no thyromegaly present.  Cardiovascular: Normal rate and regular rhythm.  Pulmonary/Chest: Effort normal and breath sounds normal. No respiratory distress.  Abdominal: Soft. Normal appearance, normal aorta and bowel sounds are normal. There is no tenderness.  Musculoskeletal: Normal range of motion.  Lymphadenopathy:    He has no cervical adenopathy.  Neurological: He is alert and oriented to person, place, and time.  Skin: Skin is warm and dry.  Psychiatric: He has a normal mood and affect. His behavior is normal. Judgment and thought content normal.   BP 125/77   Pulse 61   Temp (!) 97.1 F (36.2 C) (Oral)   Ht _0  (1.778 m)   Wt 196 lb (88.9 kg)   BMI 28.12 kg/m   hgba1c 7.5%      Assessment & Plan:  [name] comes in today with chief complaint of Medical Management of Chronic Issues   Diagnosis and orders addressed:  1. Type 2 diabetes mellitus without complication, without long-term current use of insulin ([facility]) Continue to watch carbsin diet - Bayer DCA Hb A1c Waived  2. Essential hypertension, benign Low sodium diet - CMP14+EGFR  3. Hyperlipidemia with target LDL less than 100 Low fat diet - Lipid panel  4. BMI  27.0-27.9,adult Discussed diet and exercise for person with BMI >25 Will recheck weight in 3-6 months    Labs pending Health Maintenance reviewed Diet and exercise encouraged  Follow up plan: 3 months   [name] Done, FNP

## 2018-03-27 ENCOUNTER — Ambulatory Visit (INDEPENDENT_AMBULATORY_CARE_PROVIDER_SITE_OTHER): Payer: BLUE CROSS/BLUE SHIELD

## 2018-03-27 DIAGNOSIS — Z23 Encounter for immunization: Secondary | ICD-10-CM

## 2018-05-12 ENCOUNTER — Ambulatory Visit (INDEPENDENT_AMBULATORY_CARE_PROVIDER_SITE_OTHER): Payer: Medicare HMO | Admitting: Nurse Practitioner

## 2018-05-12 ENCOUNTER — Encounter: Payer: Self-pay | Admitting: Nurse Practitioner

## 2018-05-12 VITALS — BP 119/74 | HR 60 | Temp 97.0°F | Ht 70.0 in | Wt 198.0 lb

## 2018-05-12 DIAGNOSIS — E119 Type 2 diabetes mellitus without complications: Secondary | ICD-10-CM | POA: Diagnosis not present

## 2018-05-12 DIAGNOSIS — Z6827 Body mass index (BMI) 27.0-27.9, adult: Secondary | ICD-10-CM | POA: Diagnosis not present

## 2018-05-12 DIAGNOSIS — I10 Essential (primary) hypertension: Secondary | ICD-10-CM

## 2018-05-12 DIAGNOSIS — E785 Hyperlipidemia, unspecified: Secondary | ICD-10-CM | POA: Diagnosis not present

## 2018-05-12 LAB — BAYER DCA HB A1C WAIVED: HB A1C (BAYER DCA - WAIVED): 7.5 % — ABNORMAL HIGH (ref <7.0)

## 2018-05-12 MED ORDER — METFORMIN HCL 1000 MG PO TABS: 1000.0000 mg | ORAL_TABLET | Freq: Two times a day (BID) | ORAL | Status: DC

## 2018-05-12 MED ORDER — METOPROLOL SUCCINATE ER 50 MG PO TB24: ORAL_TABLET | ORAL | 1 refills | Status: DC

## 2018-05-12 MED ORDER — ROSUVASTATIN CALCIUM 10 MG PO TABS: 10.0000 mg | ORAL_TABLET | Freq: Every day | ORAL | 1 refills | Status: DC

## 2018-05-12 NOTE — Progress Notes (Signed)
Subjective:    Patient ID: Arthur Torres, male    DOB: [birth year], 67 y.o.   MRN: [id number]   Chief Complaint: medical management of chronic issues  HPI:  1. Essential hypertension, benign  No c/o chest pain, sob or headache. Does not check blood presssure at home. BP Readings from Last 3 Encounters:  02/09/18 125/77  11/07/17 118/73  08/08/17 108/62     2. Type 2 diabetes mellitus without complication, without long-term current use of insulin ([place]) last hgba1c was 7.5%. He does not check blood sugars at home.  Denies any  Hypoglycemia at home.  3. Hyperlipidemia with target LDL less than 100  Does not watch diet and does very little exercise but does stay very busy  4. BMI 27.0-27.9,adult  No recent weight changes    Outpatient Encounter Medications as of 05/12/2018  Medication Sig  . aspirin 325 MG EC tablet Take 325 mg by mouth daily.  . metFORMIN (GLUCOPHAGE) 1000 MG tablet Take 1 tablet (1,000 mg total) by mouth 2 (two) times daily with a meal.  . metoprolol succinate (TOPROL-XL) 50 MG 24 hr tablet 1 PO QD  . rosuvastatin (CRESTOR) 10 MG tablet Take 1 tablet (10 mg total) by mouth daily.       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  HENT: Negative.   Eyes: Negative for pain.  Respiratory: Negative for shortness of breath.   Cardiovascular: Negative for chest pain, palpitations and leg swelling.  Gastrointestinal: Negative for abdominal pain.  Endocrine: Negative for polydipsia.  Genitourinary: Negative.   Skin: Negative for rash.  Neurological: Negative for dizziness, weakness and headaches.  Hematological: Does not bruise/bleed easily.  Psychiatric/Behavioral: Negative.   All other systems reviewed and are negative.      Objective:   Physical Exam  Constitutional: He is oriented to person, place, and time. He appears  well-developed and well-nourished.  HENT:  Head: Normocephalic.  Nose: Nose normal.  Mouth/Throat: Oropharynx is clear and moist.  Eyes: Pupils are equal, round, and reactive to light. EOM are normal.  Neck: Normal range of motion and phonation normal. Neck supple. No JVD present. Carotid bruit is not present. No thyroid mass and no thyromegaly present.  Cardiovascular: Normal rate and regular rhythm.  Pulmonary/Chest: Effort normal and breath sounds normal. No respiratory distress.  Abdominal: Soft. Normal appearance, normal aorta and bowel sounds are normal. There is no tenderness.  Musculoskeletal: Normal range of motion.  Lymphadenopathy:    He has no cervical adenopathy.  Neurological: He is alert and oriented to person, place, and time.  Skin: Skin is warm and dry.  Psychiatric: He has a normal mood and affect. His behavior is normal. Judgment and thought content normal.  Nursing note and vitals reviewed.   BP 119/74   Pulse 60   Temp (!) 97 F (36.1 C) (Oral)   Ht 5' 10"  (1.778 m)   Wt 198 lb (89.8 kg)   BMI 28.41 kg/m   hgba1c 7.5%     Assessment & Plan:  JORGELUIS GURGANUS comes in today with chief complaint of No chief complaint on file.   Diagnosis and orders addressed:  1. Essential hypertension, benign Low sodium diet - CMP14+EGFR - metoprolol succinate (TOPROL-XL) 50 MG 24 hr tablet; 1 PO QD  Dispense: 90 tablet; Refill: 1  2. Type 2 diabetes mellitus without complication, without  long-term current use of insulin ([facility]) Stricter carb counting - Bayer DCA Hb A1c Waived - Microalbumin / creatinine urine ratio - metFORMIN (GLUCOPHAGE) 1000 MG tablet; Take 1 tablet (1,000 mg total) by mouth 2 (two) times daily with a meal.  Dispense: 180 tablet; Refill: 01  3. Hyperlipidemia with target LDL less than 100 Low fat diet - Lipid panel - rosuvastatin (CRESTOR) 10 MG tablet; Take 1 tablet (10 mg total) by mouth daily.  Dispense: 90 tablet; Refill: 1  4. BMI  27.0-27.9,adult Discussed diet and exercise for person with BMI >25 Will recheck weight in 3-6 months   Labs pending Health Maintenance reviewed Diet and exercise encouraged  Follow up plan: 3 months   [name] Done, FNP

## 2018-05-12 NOTE — Patient Instructions (Signed)

## 2018-05-13 LAB — CMP14+EGFR
A/G RATIO: 2 (ref 1.2–2.2)
ALT: 31 IU/L (ref 0–44)
AST: 19 IU/L (ref 0–40)
Albumin: 4.4 g/dL (ref 3.6–4.8)
Alkaline Phosphatase: 67 IU/L (ref 39–117)
BUN/Creatinine Ratio: 18 (ref 10–24)
BUN: 18 mg/dL (ref 8–27)
Bilirubin Total: 0.4 mg/dL (ref 0.0–1.2)
CALCIUM: 9.6 mg/dL (ref 8.6–10.2)
CO2: 22 mmol/L (ref 20–29)
Chloride: 103 mmol/L (ref 96–106)
Creatinine, Ser: 0.99 mg/dL (ref 0.76–1.27)
GFR calc Af Amer: 93 mL/min/{1.73_m2} (ref >59)
GFR, EST NON AFRICAN AMERICAN: 80 mL/min/{1.73_m2} (ref >59)
Globulin, Total: 2.2 g/dL (ref 1.5–4.5)
Glucose: 145 mg/dL — ABNORMAL HIGH (ref 65–99)
POTASSIUM: 4.4 mmol/L (ref 3.5–5.2)
Sodium: 141 mmol/L (ref 134–144)
Total Protein: 6.6 g/dL (ref 6.0–8.5)

## 2018-05-13 LAB — MICROALBUMIN / CREATININE URINE RATIO
Creatinine, Urine: 105.5 mg/dL
Microalb/Creat Ratio: 4.4 mg/g creat (ref 0.0–30.0)
Microalbumin, Urine: 4.6 ug/mL

## 2018-05-13 LAB — LIPID PANEL
CHOL/HDL RATIO: 2.8 ratio (ref 0.0–5.0)
Cholesterol, Total: 106 mg/dL (ref 100–199)
HDL: 38 mg/dL — AB (ref >39)
LDL Calculated: 43 mg/dL (ref 0–99)
TRIGLYCERIDES: 123 mg/dL (ref 0–149)
VLDL Cholesterol Cal: 25 mg/dL (ref 5–40)

## 2018-06-29 ENCOUNTER — Other Ambulatory Visit: Payer: Self-pay

## 2018-06-29 DIAGNOSIS — E785 Hyperlipidemia, unspecified: Secondary | ICD-10-CM

## 2018-06-29 MED ORDER — ROSUVASTATIN CALCIUM 10 MG PO TABS: 10.0000 mg | ORAL_TABLET | Freq: Every day | ORAL | 1 refills | Status: DC

## 2018-07-24 ENCOUNTER — Ambulatory Visit (INDEPENDENT_AMBULATORY_CARE_PROVIDER_SITE_OTHER): Payer: Medicare HMO | Admitting: Nurse Practitioner

## 2018-07-24 ENCOUNTER — Encounter: Payer: Self-pay | Admitting: Nurse Practitioner

## 2018-07-24 VITALS — BP 125/83 | HR 68 | Temp 97.0°F | Ht 70.0 in | Wt 200.0 lb

## 2018-07-24 DIAGNOSIS — K2900 Acute gastritis without bleeding: Secondary | ICD-10-CM | POA: Diagnosis not present

## 2018-07-24 MED ORDER — PANTOPRAZOLE SODIUM 40 MG PO TBEC: 40.0000 mg | DELAYED_RELEASE_TABLET | Freq: Every day | ORAL | 3 refills | Status: DC

## 2018-07-24 NOTE — Progress Notes (Signed)
   Subjective:    Patient ID: Arthur Torres, male    DOB: [birth year], 68 y.o.   MRN: [id number]   Chief Complaint: Abdominal Pain (For 2 weeks); Diarrhea; and anal pressure   HPI Patient comes in c/o abdominal. Pain is intermittent and cannot think if it has anything to do with when he eats. He said it may be after he eats. The pain 2-3/10. Describes pain as mild ache. He has had none today. Started abiout 2 weeks ago.    Review of Systems  Constitutional: Negative.   Respiratory: Negative.   Cardiovascular: Negative.   Gastrointestinal: Positive for abdominal pain and diarrhea. Negative for constipation, nausea, rectal pain and vomiting.  Genitourinary: Negative.   Neurological: Negative.   Psychiatric/Behavioral: Negative.   All other systems reviewed and are negative.      Objective:   Physical Exam Vitals signs and nursing note reviewed.  Constitutional:      General: He is not in acute distress.    Appearance: He is obese.  Cardiovascular:     Rate and Rhythm: Normal rate and regular rhythm.  Pulmonary:     Effort: Pulmonary effort is normal.     Breath sounds: Normal breath sounds.  Abdominal:     General: Abdomen is flat. Bowel sounds are normal. There is no distension.     Palpations: Abdomen is soft. There is no shifting dullness, fluid wave, hepatomegaly, splenomegaly, mass or pulsatile mass.     Tenderness: There is abdominal tenderness in the epigastric area.  Skin:    General: Skin is warm and dry.  Neurological:     General: No focal deficit present.     Mental Status: He is alert and oriented to person, place, and time.  Psychiatric:        Mood and Affect: Mood normal.        Behavior: Behavior normal.    BP 125/83   Pulse 68   Temp (!) 97 F (36.1 C) (Oral)   Ht 5\' 10"  (1.778 m)   Wt 200 lb (90.7 kg)   BMI 28.70 kg/m        Assessment & Plan:  Arthur Torres in today with chief complaint of Abdominal Pain (For 2 weeks); Diarrhea; and  anal pressure   1. Acute gastritis without hemorrhage, unspecified gastritis type Avoid spicy foods Do not eat 2 hours prior to bedtime Keep diary of episodes - pantoprazole (PROTONIX) 40 MG tablet; Take 1 tablet (40 mg total) by mouth daily.  Dispense: 30 tablet; Refill: [place], FNP

## 2018-07-24 NOTE — Patient Instructions (Signed)
Peptic Ulcer  A peptic ulcer is a painful sore in the lining of your stomach or the first part of your small intestine. What are the causes? Common causes of this condition include:  An infection.  Using certain pain medicines too often or too much. What increases the risk? You are more likely to get this condition if you:  Smoke.  Have a family history of ulcer disease.  Drink alcohol.  Have been hospitalized in an intensive care unit (ICU). What are the signs or symptoms? Symptoms include:  Burning pain in the area between the chest and the belly button. The pain may: ? Not go away (be persistent). ? Be worse when your stomach is empty. ? Be worse at night.  Heartburn.  Feeling sick to your stomach (nauseous) and throwing up (vomiting).  Bloating. If the ulcer results in bleeding, it can cause you to:  Have poop (stool) that is black and looks like tar.  Throw up bright red blood.  Throw up material that looks like coffee grounds. How is this treated? Treatment for this condition may include:  Stopping things that can cause the ulcer, such as: ? Smoking. ? Using pain medicines.  Medicines to reduce stomach acid.  Antibiotic medicines if the ulcer is caused by an infection.  A procedure that is done using a small, flexible tube that has a camera at the end (upper endoscopy). This may be done if you have a bleeding ulcer.  Surgery. This may be needed if: ? You have a lot of bleeding. ? The ulcer caused a hole somewhere in the digestive system. Follow these instructions at home:  Do not drink alcohol if your doctor tells you not to drink.  Limit how much caffeine you take in.  Do not use any products that contain nicotine or tobacco, such as cigarettes, e-cigarettes, and chewing tobacco. If you need help quitting, ask your doctor.  Take over-the-counter and prescription medicines only as told by your doctor. ? Do not stop or change your medicines unless  you talk with your doctor about it first. ? Do not take aspirin, ibuprofen, or other NSAIDs unless your doctor told you to do so.  Keep all follow-up visits as told by your doctor. This is important. Contact a doctor if:  You do not get better in 7 days after you start treatment.  You keep having an upset stomach (indigestion) or heartburn. Get help right away if:  You have sudden, sharp pain in your belly (abdomen).  You have belly pain that does not go away.  You have bloody poop (stool) or black, tarry poop.  You throw up blood. It may look like coffee grounds.  You feel light-headed or feel like you may pass out (faint).  You get weak.  You get sweaty or feel sticky and cold to the touch (clammy). Summary  Symptoms of a peptic ulcer include burning pain in the area between the chest and the belly button.  Take medicines only as told by your doctor.  Limit how much alcohol and caffeine you have.  Keep all follow-up visits as told by your doctor. This information is not intended to replace advice given to you by your health care provider. Make sure you discuss any questions you have with your health care provider. Document Released: 09/08/2009 Document Revised: 12/20/2017 Document Reviewed: 12/20/2017 Elsevier Interactive Patient Education  2019 [employer].

## 2018-08-14 ENCOUNTER — Ambulatory Visit (INDEPENDENT_AMBULATORY_CARE_PROVIDER_SITE_OTHER): Payer: Medicare HMO | Admitting: Nurse Practitioner

## 2018-08-14 ENCOUNTER — Encounter: Payer: Self-pay | Admitting: Nurse Practitioner

## 2018-08-14 VITALS — BP 126/79 | HR 68 | Temp 97.8°F | Ht 70.0 in | Wt 198.2 lb

## 2018-08-14 DIAGNOSIS — Z6827 Body mass index (BMI) 27.0-27.9, adult: Secondary | ICD-10-CM

## 2018-08-14 DIAGNOSIS — E785 Hyperlipidemia, unspecified: Secondary | ICD-10-CM | POA: Diagnosis not present

## 2018-08-14 DIAGNOSIS — E119 Type 2 diabetes mellitus without complications: Secondary | ICD-10-CM

## 2018-08-14 DIAGNOSIS — I10 Essential (primary) hypertension: Secondary | ICD-10-CM

## 2018-08-14 LAB — BAYER DCA HB A1C WAIVED: HB A1C: 7.5 % — AB (ref <7.0)

## 2018-08-14 NOTE — Patient Instructions (Signed)
Diabetes Mellitus and Foot Care  Foot care is an important part of your health, especially when you have diabetes. Diabetes may cause you to have problems because of poor blood flow (circulation) to your feet and legs, which can cause your skin to:   Become thinner and drier.   Break more easily.   Heal more slowly.   Peel and crack.  You may also have nerve damage (neuropathy) in your legs and feet, causing decreased feeling in them. This means that you may not notice minor injuries to your feet that could lead to more serious problems. Noticing and addressing any potential problems early is the best way to prevent future foot problems.  How to care for your feet  Foot hygiene   Wash your feet daily with warm water and mild soap. Do not use hot water. Then, pat your feet and the areas between your toes until they are completely dry. Do not soak your feet as this can dry your skin.   Trim your toenails straight across. Do not dig under them or around the cuticle. File the edges of your nails with an emery board or nail file.   Apply a moisturizing lotion or petroleum jelly to the skin on your feet and to dry, brittle toenails. Use lotion that does not contain alcohol and is unscented. Do not apply lotion between your toes.  Shoes and socks   Wear clean socks or stockings every day. Make sure they are not too tight. Do not wear knee-high stockings since they may decrease blood flow to your legs.   Wear shoes that fit properly and have enough cushioning. Always look in your shoes before you put them on to be sure there are no objects inside.   To break in new shoes, wear them for just a few hours a day. This prevents injuries on your feet.  Wounds, scrapes, corns, and calluses   Check your feet daily for blisters, cuts, bruises, sores, and redness. If you cannot see the bottom of your feet, use a mirror or ask someone for help.   Do not cut corns or calluses or try to remove them with medicine.   If you  find a minor scrape, cut, or break in the skin on your feet, keep it and the skin around it clean and dry. You may clean these areas with mild soap and water. Do not clean the area with peroxide, alcohol, or iodine.   If you have a wound, scrape, corn, or callus on your foot, look at it several times a day to make sure it is healing and not infected. Check for:  ? Redness, swelling, or pain.  ? Fluid or blood.  ? Warmth.  ? Pus or a bad smell.  General instructions   Do not cross your legs. This may decrease blood flow to your feet.   Do not use heating pads or hot water bottles on your feet. They may burn your skin. If you have lost feeling in your feet or legs, you may not know this is happening until it is too late.   Protect your feet from hot and cold by wearing shoes, such as at the beach or on hot pavement.   Schedule a complete foot exam at least once a year (annually) or more often if you have foot problems. If you have foot problems, report any cuts, sores, or bruises to your health care provider immediately.  Contact a health care provider if:     You have a medical condition that increases your risk of infection and you have any cuts, sores, or bruises on your feet.   You have an injury that is not healing.   You have redness on your legs or feet.   You feel burning or tingling in your legs or feet.   You have pain or cramps in your legs and feet.   Your legs or feet are numb.   Your feet always feel cold.   You have pain around a toenail.  Get help right away if:   You have a wound, scrape, corn, or callus on your foot and:  ? You have pain, swelling, or redness that gets worse.  ? You have fluid or blood coming from the wound, scrape, corn, or callus.  ? Your wound, scrape, corn, or callus feels warm to the touch.  ? You have pus or a bad smell coming from the wound, scrape, corn, or callus.  ? You have a fever.  ? You have a red line going up your leg.  Summary   Check your feet every day  for cuts, sores, red spots, swelling, and blisters.   Moisturize feet and legs daily.   Wear shoes that fit properly and have enough cushioning.   If you have foot problems, report any cuts, sores, or bruises to your health care provider immediately.   Schedule a complete foot exam at least once a year (annually) or more often if you have foot problems.  This information is not intended to replace advice given to you by your health care provider. Make sure you discuss any questions you have with your health care provider.  Document Released: 06/11/2000 Document Revised: 07/27/2017 Document Reviewed: 07/16/2016  Elsevier Interactive Patient Education  2019 Elsevier Inc.

## 2018-08-14 NOTE — Progress Notes (Signed)
Subjective:    Patient ID: Arthur Torres, male    DOB: 1950/10/03, 68 y.o.   MRN: [id number]   Chief Complaint: medical management of chronic issues  HPI:  1. Essential hypertension, benign  No c/o chest pain, sob or headache. Does not check blood pressure at home. BP Readings from Last 3 Encounters:  07/24/18 125/83  05/12/18 119/74  02/09/18 125/77     2. Type 2 diabetes mellitus without complication, without long-term current use of insulin ([place]) last hgba1c was 7.5%. doe snit check blood sugars at home. Denies any symptoms of low blood sugars.  3. Hyperlipidemia with target LDL less than 100  Does not watch fats in diet. Does very little exercise.  4. BMI 27.0-27.9,adult  No recent weight chnages    Outpatient Encounter Medications as of 08/14/2018  Medication Sig  . aspirin 325 MG EC tablet Take 325 mg by mouth daily.  . metFORMIN (GLUCOPHAGE) 1000 MG tablet Take 1 tablet (1,000 mg total) by mouth 2 (two) times daily with a meal.  . metoprolol succinate (TOPROL-XL) 50 MG 24 hr tablet 1 PO QD  . pantoprazole (PROTONIX) 40 MG tablet Take 1 tablet (40 mg total) by mouth daily.  . rosuvastatin (CRESTOR) 10 MG tablet Take 1 tablet (10 mg total) by mouth daily.   No facility-administered encounter medications on file as of 08/14/2018.       New complaints: None today  Social history: Works for [employer].    Review of Systems  Constitutional: Negative for activity change and appetite change.  HENT: Negative.   Eyes: Negative for pain.  Respiratory: Negative for shortness of breath.   Cardiovascular: Negative for chest pain, palpitations and leg swelling.  Gastrointestinal: Negative for abdominal pain.  Endocrine: Negative for polydipsia.  Genitourinary: Negative.   Skin: Negative for rash.  Neurological: Negative for dizziness, weakness and headaches.  Hematological: Does not bruise/bleed easily.  Psychiatric/Behavioral: Negative.   All other systems  reviewed and are negative.      Objective:   Physical Exam Vitals signs and nursing note reviewed.  Constitutional:      Appearance: Normal appearance. He is well-developed.  HENT:     Head: Normocephalic.     Nose: Nose normal.  Eyes:     Pupils: Pupils are equal, round, and reactive to light.  Neck:     Musculoskeletal: Normal range of motion and neck supple.     Thyroid: No thyroid mass or thyromegaly.     Vascular: No carotid bruit or JVD.     Trachea: Phonation normal.  Cardiovascular:     Rate and Rhythm: Normal rate and regular rhythm.  Pulmonary:     Effort: Pulmonary effort is normal. No respiratory distress.     Breath sounds: Normal breath sounds.  Abdominal:     General: Bowel sounds are normal.     Palpations: Abdomen is soft.     Tenderness: There is no abdominal tenderness.  Musculoskeletal: Normal range of motion.  Lymphadenopathy:     Cervical: No cervical adenopathy.  Skin:    General: Skin is warm and dry.  Neurological:     Mental Status: He is alert and oriented to person, place, and time.  Psychiatric:        Behavior: Behavior normal.        Thought Content: Thought content normal.        Judgment: Judgment normal.    BP 126/79   Pulse 68   Temp 97.8  F (36.6 C) (Oral)   Ht _0  (1.778 m)   Wt 198 lb 4 oz (89.9 kg)   BMI 28.45 kg/m   hgba1c 7.5 .    Assessment & Plan:  Arthur Torres comes in today with chief complaint of Medical Management of Chronic Issues   Diagnosis and orders addressed:  1. Essential hypertension, benign Low sodium diet  2. Type 2 diabetes mellitus without complication, without long-term current use of insulin (HCC) Stricter carb counting - CMP14+EGFR - Bayer DCA Hb A1c Waived  3. Hyperlipidemia with target LDL less than 100 Low fat diet - Lipid panel  4. BMI 27.0-27.9,adult Discussed diet and exercise for person with BMI >25 Will recheck weight in 3-6 months    Labs pending Health Maintenance  reviewed Diet and exercise encouraged  Follow up plan: 3 months   [name] Done, FNP

## 2018-08-15 LAB — CMP14+EGFR
ALT: 35 IU/L (ref 0–44)
AST: 21 IU/L (ref 0–40)
Albumin/Globulin Ratio: 2 (ref 1.2–2.2)
Albumin: 4.5 g/dL (ref 3.8–4.8)
Alkaline Phosphatase: 67 IU/L (ref 39–117)
BUN/Creatinine Ratio: 18 (ref 10–24)
BUN: 21 mg/dL (ref 8–27)
Bilirubin Total: 0.4 mg/dL (ref 0.0–1.2)
CALCIUM: 9.5 mg/dL (ref 8.6–10.2)
CO2: 21 mmol/L (ref 20–29)
Chloride: 100 mmol/L (ref 96–106)
Creatinine, Ser: 1.16 mg/dL (ref 0.76–1.27)
GFR, EST AFRICAN AMERICAN: 76 mL/min/{1.73_m2} (ref >59)
GFR, EST NON AFRICAN AMERICAN: 66 mL/min/{1.73_m2} (ref >59)
GLUCOSE: 176 mg/dL — AB (ref 65–99)
Globulin, Total: 2.3 g/dL (ref 1.5–4.5)
POTASSIUM: 4.1 mmol/L (ref 3.5–5.2)
Sodium: 136 mmol/L (ref 134–144)
TOTAL PROTEIN: 6.8 g/dL (ref 6.0–8.5)

## 2018-08-15 LAB — LIPID PANEL
CHOLESTEROL TOTAL: 108 mg/dL (ref 100–199)
Chol/HDL Ratio: 2.6 ratio (ref 0.0–5.0)
HDL: 42 mg/dL (ref >39)
LDL Calculated: 45 mg/dL (ref 0–99)
TRIGLYCERIDES: 105 mg/dL (ref 0–149)
VLDL Cholesterol Cal: 21 mg/dL (ref 5–40)

## 2018-08-31 ENCOUNTER — Ambulatory Visit: Payer: Medicare HMO | Admitting: Nurse Practitioner

## 2018-09-11 ENCOUNTER — Other Ambulatory Visit: Payer: Self-pay

## 2018-09-11 ENCOUNTER — Ambulatory Visit (INDEPENDENT_AMBULATORY_CARE_PROVIDER_SITE_OTHER): Payer: Medicare HMO | Admitting: Nurse Practitioner

## 2018-09-11 ENCOUNTER — Encounter: Payer: Self-pay | Admitting: Nurse Practitioner

## 2018-09-11 ENCOUNTER — Encounter: Payer: Medicare HMO | Admitting: Nurse Practitioner

## 2018-09-11 VITALS — BP 111/61 | HR 68 | Temp 97.1°F | Ht 70.0 in | Wt 202.0 lb

## 2018-09-11 DIAGNOSIS — Z0000 Encounter for general adult medical examination without abnormal findings: Secondary | ICD-10-CM | POA: Diagnosis not present

## 2018-09-11 NOTE — Patient Instructions (Signed)

## 2018-09-11 NOTE — Progress Notes (Signed)
Subjective:   Arthur Torres is a 68 y.o. male who presents for a Welcome to Medicare exam.    Chief Complaint: Welcome to medicare  HPI Patient come sin today for welcome to medicare visit. He just had follow up of chronic issues on 08/14/18. He was doing well with no complaints.  Patient Active Problem List   Diagnosis Date Noted  . Allergic rhinitis due to pollen 10/22/2015  . BMI 27.0-27.9,adult 04/17/2015  . H/O prostate cancer 10/04/2014  . History of right coronary artery stent placement 10/06/2012  . Hyperlipidemia with target LDL less than 100 10/03/2012  . Essential hypertension, benign 10/03/2012  . Diabetes ([place]) 10/03/2012     Review of Systems: Review of Systems  Constitutional: Negative.   HENT: Negative.   Respiratory: Negative.   Cardiovascular: Negative.   Gastrointestinal: Negative.   Genitourinary: Negative.   Musculoskeletal: Negative.   Skin: Negative.   Neurological: Negative.   Psychiatric/Behavioral: Negative.   All other systems reviewed and are negative.         Objective:    Today's Vitals   09/11/18 1536  BP: 111/61  Pulse: 68  Temp: (!) 97.1 F (36.2 C)  TempSrc: Oral  Weight: 202 lb (91.6 kg)  Height: 5\' 10"  (1.778 m)   Body mass index is 28.98 kg/m.  Medications Outpatient Encounter Medications as of 09/11/2018  Medication Sig  . aspirin 325 MG EC tablet Take 325 mg by mouth daily.  . metFORMIN (GLUCOPHAGE) 1000 MG tablet Take 1 tablet (1,000 mg total) by mouth 2 (two) times daily with a meal.  . metoprolol succinate (TOPROL-XL) 50 MG 24 hr tablet 1 PO QD  . pantoprazole (PROTONIX) 40 MG tablet Take 1 tablet (40 mg total) by mouth daily.  . rosuvastatin (CRESTOR) 10 MG tablet Take 1 tablet (10 mg total) by mouth daily.    History: Past Medical History:  Diagnosis Date  . CAD (coronary artery disease)    1999 occluded LAD treated with PCI and stenting, 80% stenosis of the first diagonal feeling angioplasty, 60-70%  second stenosis, 50% ostial left main stenosis, 25% right coronary artery stenosis, 50% ostial PDA stenosis.  . Cancer [facility])    prostate (treated with radiation)  . Diabetes mellitus without complication ([place])   . Hyperlipidemia   . Hypertension   . Joint pain    Past Surgical History:  Procedure Laterality Date  . CARDIAC CATHETERIZATION    . COLON SURGERY     hemrrhoids  . CORONARY ANGIOPLASTY WITH STENT PLACEMENT      Family History  Problem Relation Age of Onset  . Diabetes Mother   . Stroke Mother   . Cancer Father        skin, prostate, stomach   Social History   Occupational History  . Occupation: Autobody work    Fish farm manager: [employer]  Tobacco Use  . Smoking status: Former Smoker    Types: Cigarettes    Last attempt to quit: 03/28/1998    Years since quitting: 20.4  . Smokeless tobacco: Never Used  Substance and Sexual Activity  . Alcohol use: No  . Drug use: No  . Sexual activity: Not on file   Tobacco Counseling Counseling given: Not Answered   Immunizations and Health Maintenance Immunization History  Administered Date(s) Administered  . Influenza,inj,Quad PF,6+ Mos 04/13/2013, 04/17/2015, 03/27/2018  . Pneumococcal Polysaccharide-23 12/11/2013  . Zoster Recombinat (Shingrix) 05/03/2017, 08/08/2017   Health Maintenance Due  Topic Date Due  . HIV  Screening  05/27/1968  . TETANUS/TDAP  06/29/2016  . PNA vac Low Risk Adult (1 of 2 - PCV13) 05/27/2018  . COLON CANCER SCREENING ANNUAL FOBT  08/09/2018  . OPHTHALMOLOGY EXAM  08/18/2018    Activities of Daily Living Functional Status Survey: Is the patient deaf or have difficulty hearing?: No Does the patient have difficulty seeing, even when wearing glasses/contacts?: No Does the patient have difficulty concentrating, remembering, or making decisions?: No Does the patient have difficulty walking or climbing stairs?: No Does the patient have difficulty dressing or bathing?: No Does the patient  have difficulty doing errands alone such as visiting a doctor's office or shopping?: No   Physical Exam  No assessment done today(optional), or other factors deemed appropriate based on the beneficiary's medical and social history and current clinical standards.  Advanced Directives:  does not have- will give information    Assessment:    This is a routine wellness  examination for this patient . Welcome  To medicare  Vision/Hearing screen No exam data present  Dietary issues and exercise activities discussed:     Goals   Would like to lose weigght over th enext year     Depression Screen PHQ 2/9 Scores 09/11/2018 08/14/2018 05/12/2018 02/09/2018  PHQ - 2 Score 0 0 0 0     Fall Risk Fall Risk  09/11/2018  Falls in the past year? 0    Cognitive Function   MMSE - Mini Mental State Exam 09/11/2018  Orientation to time 5  Orientation to Place 5  Registration 3  Attention/ Calculation 5  Recall 3  Language- name 2 objects 2  Language- repeat 1  Language- follow 3 step command 3  Language- read & follow direction 1  Write a sentence 1  Copy design 1  Total score 30    EKG- Kerry Hough, FNP      Patient Care Team: Chevis Pretty, FNP as PCP - General (Nurse Practitioner)     Plan:    welcome to medicare.  I have personally reviewed and noted the following in the patient's chart:   . Medical and social history . Use of alcohol, tobacco or illicit drugs  . Current medications and supplements . Functional ability and status . Nutritional status . Physical activity . Advanced directives . List of other physicians . Hospitalizations, surgeries, and ER visits in previous 12 months . Vitals . Screenings to include cognitive, depression, and falls . Referrals and appointments  In addition, I have reviewed and discussed with patient certain preventive protocols, quality metrics, and best practice recommendations. A written personalized care  plan for preventive services as well as general preventive health recommendations were provided to patient.    [place], [place] 09/11/2018

## 2018-10-16 ENCOUNTER — Other Ambulatory Visit: Payer: Self-pay | Admitting: Nurse Practitioner

## 2018-10-16 DIAGNOSIS — K2900 Acute gastritis without bleeding: Secondary | ICD-10-CM

## 2018-11-05 DIAGNOSIS — M25562 Pain in left knee: Secondary | ICD-10-CM | POA: Diagnosis not present

## 2018-11-05 DIAGNOSIS — M1990 Unspecified osteoarthritis, unspecified site: Secondary | ICD-10-CM | POA: Diagnosis not present

## 2018-11-05 DIAGNOSIS — M1712 Unilateral primary osteoarthritis, left knee: Secondary | ICD-10-CM | POA: Diagnosis not present

## 2018-11-05 DIAGNOSIS — M25462 Effusion, left knee: Secondary | ICD-10-CM | POA: Diagnosis not present

## 2018-11-10 ENCOUNTER — Other Ambulatory Visit: Payer: Self-pay

## 2018-11-10 ENCOUNTER — Other Ambulatory Visit: Payer: Self-pay | Admitting: Nurse Practitioner

## 2018-11-10 DIAGNOSIS — I10 Essential (primary) hypertension: Secondary | ICD-10-CM

## 2018-11-13 ENCOUNTER — Other Ambulatory Visit: Payer: Self-pay

## 2018-11-13 ENCOUNTER — Encounter: Payer: Self-pay | Admitting: Nurse Practitioner

## 2018-11-13 ENCOUNTER — Ambulatory Visit (INDEPENDENT_AMBULATORY_CARE_PROVIDER_SITE_OTHER): Payer: Medicare HMO | Admitting: Nurse Practitioner

## 2018-11-13 VITALS — BP 106/65 | HR 65 | Temp 97.3°F | Ht 70.0 in | Wt 197.0 lb

## 2018-11-13 DIAGNOSIS — I10 Essential (primary) hypertension: Secondary | ICD-10-CM | POA: Diagnosis not present

## 2018-11-13 DIAGNOSIS — E119 Type 2 diabetes mellitus without complications: Secondary | ICD-10-CM

## 2018-11-13 DIAGNOSIS — E785 Hyperlipidemia, unspecified: Secondary | ICD-10-CM

## 2018-11-13 DIAGNOSIS — K2900 Acute gastritis without bleeding: Secondary | ICD-10-CM

## 2018-11-13 DIAGNOSIS — Z6827 Body mass index (BMI) 27.0-27.9, adult: Secondary | ICD-10-CM | POA: Diagnosis not present

## 2018-11-13 LAB — BAYER DCA HB A1C WAIVED: HB A1C (BAYER DCA - WAIVED): 7.4 % — ABNORMAL HIGH (ref <7.0)

## 2018-11-13 MED ORDER — PANTOPRAZOLE SODIUM 40 MG PO TBEC: 40.0000 mg | DELAYED_RELEASE_TABLET | Freq: Every day | ORAL | 1 refills | Status: DC

## 2018-11-13 MED ORDER — ROSUVASTATIN CALCIUM 10 MG PO TABS: 10.0000 mg | ORAL_TABLET | Freq: Every day | ORAL | 1 refills | Status: DC

## 2018-11-13 MED ORDER — METFORMIN HCL 1000 MG PO TABS: 1000.0000 mg | ORAL_TABLET | Freq: Two times a day (BID) | ORAL | Status: DC

## 2018-11-13 NOTE — Patient Instructions (Signed)
Diabetes Mellitus and Foot Care  Foot care is an important part of your health, especially when you have diabetes. Diabetes may cause you to have problems because of poor blood flow (circulation) to your feet and legs, which can cause your skin to:   Become thinner and drier.   Break more easily.   Heal more slowly.   Peel and crack.  You may also have nerve damage (neuropathy) in your legs and feet, causing decreased feeling in them. This means that you may not notice minor injuries to your feet that could lead to more serious problems. Noticing and addressing any potential problems early is the best way to prevent future foot problems.  How to care for your feet  Foot hygiene   Wash your feet daily with warm water and mild soap. Do not use hot water. Then, pat your feet and the areas between your toes until they are completely dry. Do not soak your feet as this can dry your skin.   Trim your toenails straight across. Do not dig under them or around the cuticle. File the edges of your nails with an emery board or nail file.   Apply a moisturizing lotion or petroleum jelly to the skin on your feet and to dry, brittle toenails. Use lotion that does not contain alcohol and is unscented. Do not apply lotion between your toes.  Shoes and socks   Wear clean socks or stockings every day. Make sure they are not too tight. Do not wear knee-high stockings since they may decrease blood flow to your legs.   Wear shoes that fit properly and have enough cushioning. Always look in your shoes before you put them on to be sure there are no objects inside.   To break in new shoes, wear them for just a few hours a day. This prevents injuries on your feet.  Wounds, scrapes, corns, and calluses   Check your feet daily for blisters, cuts, bruises, sores, and redness. If you cannot see the bottom of your feet, use a mirror or ask someone for help.   Do not cut corns or calluses or try to remove them with medicine.   If you  find a minor scrape, cut, or break in the skin on your feet, keep it and the skin around it clean and dry. You may clean these areas with mild soap and water. Do not clean the area with peroxide, alcohol, or iodine.   If you have a wound, scrape, corn, or callus on your foot, look at it several times a day to make sure it is healing and not infected. Check for:  ? Redness, swelling, or pain.  ? Fluid or blood.  ? Warmth.  ? Pus or a bad smell.  General instructions   Do not cross your legs. This may decrease blood flow to your feet.   Do not use heating pads or hot water bottles on your feet. They may burn your skin. If you have lost feeling in your feet or legs, you may not know this is happening until it is too late.   Protect your feet from hot and cold by wearing shoes, such as at the beach or on hot pavement.   Schedule a complete foot exam at least once a year (annually) or more often if you have foot problems. If you have foot problems, report any cuts, sores, or bruises to your health care provider immediately.  Contact a health care provider if:     You have a medical condition that increases your risk of infection and you have any cuts, sores, or bruises on your feet.   You have an injury that is not healing.   You have redness on your legs or feet.   You feel burning or tingling in your legs or feet.   You have pain or cramps in your legs and feet.   Your legs or feet are numb.   Your feet always feel cold.   You have pain around a toenail.  Get help right away if:   You have a wound, scrape, corn, or callus on your foot and:  ? You have pain, swelling, or redness that gets worse.  ? You have fluid or blood coming from the wound, scrape, corn, or callus.  ? Your wound, scrape, corn, or callus feels warm to the touch.  ? You have pus or a bad smell coming from the wound, scrape, corn, or callus.  ? You have a fever.  ? You have a red line going up your leg.  Summary   Check your feet every day  for cuts, sores, red spots, swelling, and blisters.   Moisturize feet and legs daily.   Wear shoes that fit properly and have enough cushioning.   If you have foot problems, report any cuts, sores, or bruises to your health care provider immediately.   Schedule a complete foot exam at least once a year (annually) or more often if you have foot problems.  This information is not intended to replace advice given to you by your health care provider. Make sure you discuss any questions you have with your health care provider.  Document Released: 06/11/2000 Document Revised: 07/27/2017 Document Reviewed: 07/16/2016  Elsevier Interactive Patient Education  2019 Elsevier Inc.

## 2018-11-13 NOTE — Progress Notes (Signed)
Subjective:    Patient ID: Arthur Torres, male    DOB: 1950/07/14, 68 y.o.   MRN: [id number]   Chief Complaint: medial management of chronic issues   HPI:  1. Essential hypertension, benign No c/o chest pain, sob or headache. Does not check blood pressures at home. BP Readings from Last 3 Encounters:  09/11/18 111/61  08/14/18 126/79  07/24/18 125/83     2. Type 2 diabetes mellitus without complication, without long-term current use of insulin (HCC) Last hgba1c was 7.5%. he does not check his blood sugars. denies any symptoms of low blood sugars.  3. Hyperlipidemia with target LDL less than 100 Doe snot watch diet and does no exercise.  4. BMI 27.0-27.9,adult No recent weight changes    Outpatient Encounter Medications as of 11/13/2018  Medication Sig  . aspirin 325 MG EC tablet Take 325 mg by mouth daily.  . metFORMIN (GLUCOPHAGE) 1000 MG tablet Take 1 tablet (1,000 mg total) by mouth 2 (two) times daily with a meal.  . metoprolol succinate (TOPROL-XL) 50 MG 24 hr tablet TAKE 1 TABLET BY MOUTH EVERY DAY  . pantoprazole (PROTONIX) 40 MG tablet TAKE 1 TABLET BY MOUTH EVERY DAY  . rosuvastatin (CRESTOR) 10 MG tablet Take 1 tablet (10 mg total) by mouth daily.      New complaints: Has had some left knee pain. Went to urgent care 1 week ago and they gave him diclofenac and it has helped a little.  Social history: Lives with his wife     Review of Systems  Constitutional: Negative for activity change and appetite change.  HENT: Negative.   Eyes: Negative for pain.  Respiratory: Negative for shortness of breath.   Cardiovascular: Negative for chest pain, palpitations and leg swelling.  Gastrointestinal: Negative for abdominal pain.  Endocrine: Negative for polydipsia.  Genitourinary: Negative.   Skin: Negative for rash.  Neurological: Negative for dizziness, weakness and headaches.  Hematological: Does not bruise/bleed easily.  Psychiatric/Behavioral:  Negative.   All other systems reviewed and are negative.      Objective:   Physical Exam Vitals signs and nursing note reviewed.  Constitutional:      Appearance: Normal appearance. He is well-developed.  HENT:     Head: Normocephalic.     Nose: Nose normal.  Eyes:     Pupils: Pupils are equal, round, and reactive to light.  Neck:     Musculoskeletal: Normal range of motion and neck supple.     Thyroid: No thyroid mass or thyromegaly.     Vascular: No carotid bruit or JVD.     Trachea: Phonation normal.  Cardiovascular:     Rate and Rhythm: Normal rate and regular rhythm.  Pulmonary:     Effort: Pulmonary effort is normal. No respiratory distress.     Breath sounds: Normal breath sounds.  Abdominal:     General: Bowel sounds are normal.     Palpations: Abdomen is soft.     Tenderness: There is no abdominal tenderness.  Musculoskeletal: Normal range of motion.     Comments: Mild left knee edema No patella tenderness crepitus on flexion and extension FROM without pain  Lymphadenopathy:     Cervical: No cervical adenopathy.  Skin:    General: Skin is warm and dry.  Neurological:     Mental Status: He is alert and oriented to person, place, and time.  Psychiatric:        Behavior: Behavior normal.  Thought Content: Thought content normal.        Judgment: Judgment normal.    BP 106/65   Pulse 65   Temp (!) 97.3 F (36.3 C) (Oral)   Ht _0  (1.778 m)   Wt 197 lb (89.4 kg)   BMI 28.27 kg/m   hgba1c 7.4%      Assessment & Plan:  Arthur Torres comes in today with chief complaint of No chief complaint on file.   Diagnosis and orders addressed:  1. Essential hypertension, benign Low sodium diet - CMP14+EGFR  2. Type 2 diabetes mellitus without complication, without long-term current use of insulin (HCC) Carb countig  - Bayer DCA Hb A1c Waived - metFORMIN (GLUCOPHAGE) 1000 MG tablet; Take 1 tablet (1,000 mg total) by mouth 2 (two) times daily with  a meal.  Dispense: 180 tablet; Refill: 01  3. Hyperlipidemia with target LDL less than 100 Low fat diet - Lipid panel - rosuvastatin (CRESTOR) 10 MG tablet; Take 1 tablet (10 mg total) by mouth daily.  Dispense: 90 tablet; Refill: 1  4. BMI 27.0-27.9,adult Discussed diet and exercise for person with BMI >25 Will recheck weight in 3-6 months  5. Acute gastritis without hemorrhage, unspecified gastritis type Avoid spicy foods Do not eat 2 hours prior to bedtime - pantoprazole (PROTONIX) 40 MG tablet; Take 1 tablet (40 mg total) by mouth daily.  Dispense: 90 tablet; Refill: 1   Labs pending Health Maintenance reviewed Diet and exercise encouraged  Follow up plan: 3 months   [name] Done, FNP

## 2018-11-14 LAB — CMP14+EGFR
ALT: 37 IU/L (ref 0–44)
AST: 24 IU/L (ref 0–40)
Albumin/Globulin Ratio: 1.9 (ref 1.2–2.2)
Albumin: 4.5 g/dL (ref 3.8–4.8)
Alkaline Phosphatase: 76 IU/L (ref 39–117)
BUN/Creatinine Ratio: 23 (ref 10–24)
BUN: 23 mg/dL (ref 8–27)
Bilirubin Total: 0.5 mg/dL (ref 0.0–1.2)
CO2: 21 mmol/L (ref 20–29)
Calcium: 9.5 mg/dL (ref 8.6–10.2)
Chloride: 102 mmol/L (ref 96–106)
Creatinine, Ser: 1.01 mg/dL (ref 0.76–1.27)
GFR calc Af Amer: 90 mL/min/{1.73_m2} (ref >59)
GFR calc non Af Amer: 78 mL/min/{1.73_m2} (ref >59)
Globulin, Total: 2.4 g/dL (ref 1.5–4.5)
Glucose: 124 mg/dL — ABNORMAL HIGH (ref 65–99)
Potassium: 4.3 mmol/L (ref 3.5–5.2)
Sodium: 138 mmol/L (ref 134–144)
Total Protein: 6.9 g/dL (ref 6.0–8.5)

## 2018-11-14 LAB — LIPID PANEL
Chol/HDL Ratio: 3.3 ratio (ref 0.0–5.0)
Cholesterol, Total: 121 mg/dL (ref 100–199)
HDL: 37 mg/dL — ABNORMAL LOW (ref >39)
LDL Calculated: 54 mg/dL (ref 0–99)
Triglycerides: 150 mg/dL — ABNORMAL HIGH (ref 0–149)
VLDL Cholesterol Cal: 30 mg/dL (ref 5–40)

## 2019-01-25 ENCOUNTER — Other Ambulatory Visit: Payer: Self-pay | Admitting: Nurse Practitioner

## 2019-01-25 DIAGNOSIS — K2900 Acute gastritis without bleeding: Secondary | ICD-10-CM

## 2019-02-07 ENCOUNTER — Other Ambulatory Visit: Payer: Self-pay | Admitting: Nurse Practitioner

## 2019-02-07 DIAGNOSIS — I10 Essential (primary) hypertension: Secondary | ICD-10-CM

## 2019-02-10 ENCOUNTER — Other Ambulatory Visit: Payer: Self-pay | Admitting: Nurse Practitioner

## 2019-02-10 DIAGNOSIS — E119 Type 2 diabetes mellitus without complications: Secondary | ICD-10-CM

## 2019-02-14 ENCOUNTER — Other Ambulatory Visit: Payer: Self-pay

## 2019-02-16 ENCOUNTER — Ambulatory Visit (INDEPENDENT_AMBULATORY_CARE_PROVIDER_SITE_OTHER): Payer: Medicare HMO | Admitting: Nurse Practitioner

## 2019-02-16 ENCOUNTER — Telehealth: Payer: Self-pay | Admitting: Nurse Practitioner

## 2019-02-16 ENCOUNTER — Other Ambulatory Visit: Payer: Self-pay

## 2019-02-16 ENCOUNTER — Encounter: Payer: Self-pay | Admitting: Nurse Practitioner

## 2019-02-16 VITALS — BP 130/82 | HR 58 | Temp 96.6°F | Ht 70.0 in | Wt 199.4 lb

## 2019-02-16 DIAGNOSIS — Z6827 Body mass index (BMI) 27.0-27.9, adult: Secondary | ICD-10-CM

## 2019-02-16 DIAGNOSIS — E785 Hyperlipidemia, unspecified: Secondary | ICD-10-CM | POA: Diagnosis not present

## 2019-02-16 DIAGNOSIS — I10 Essential (primary) hypertension: Secondary | ICD-10-CM

## 2019-02-16 DIAGNOSIS — K2900 Acute gastritis without bleeding: Secondary | ICD-10-CM | POA: Diagnosis not present

## 2019-02-16 DIAGNOSIS — E119 Type 2 diabetes mellitus without complications: Secondary | ICD-10-CM | POA: Diagnosis not present

## 2019-02-16 LAB — BAYER DCA HB A1C WAIVED: HB A1C (BAYER DCA - WAIVED): 8.1 % — ABNORMAL HIGH (ref <7.0)

## 2019-02-16 MED ORDER — METFORMIN HCL 1000 MG PO TABS: 1000.0000 mg | ORAL_TABLET | Freq: Two times a day (BID) | ORAL | 1 refills | Status: DC

## 2019-02-16 MED ORDER — SITAGLIPTIN PHOSPHATE 100 MG PO TABS: 100.0000 mg | ORAL_TABLET | Freq: Every day | ORAL | 5 refills | Status: DC

## 2019-02-16 MED ORDER — PANTOPRAZOLE SODIUM 40 MG PO TBEC: 40.0000 mg | DELAYED_RELEASE_TABLET | Freq: Every day | ORAL | 1 refills | Status: DC

## 2019-02-16 MED ORDER — ROSUVASTATIN CALCIUM 10 MG PO TABS: 10.0000 mg | ORAL_TABLET | Freq: Every day | ORAL | 1 refills | Status: DC

## 2019-02-16 MED ORDER — METOPROLOL SUCCINATE ER 50 MG PO TB24: 50.0000 mg | ORAL_TABLET | Freq: Every day | ORAL | 1 refills | Status: DC

## 2019-02-16 NOTE — Patient Instructions (Signed)
Carbohydrate Counting for Diabetes Mellitus, Adult  Carbohydrate counting is a method of keeping track of how many carbohydrates you eat. Eating carbohydrates naturally increases the amount of sugar (glucose) in the blood. Counting how many carbohydrates you eat helps keep your blood glucose within normal limits, which helps you manage your diabetes (diabetes mellitus). It is important to know how many carbohydrates you can safely have in each meal. This is different for every person. A diet and nutrition specialist (registered dietitian) can help you make a meal plan and calculate how many carbohydrates you should have at each meal and snack. Carbohydrates are found in the following foods:  Grains, such as breads and cereals.  Dried beans and soy products.  Starchy vegetables, such as potatoes, peas, and corn.  Fruit and fruit juices.  Milk and yogurt.  Sweets and snack foods, such as cake, cookies, candy, chips, and soft drinks. How do I count carbohydrates? There are two ways to count carbohydrates in food. You can use either of the methods or a combination of both. Reading "Nutrition Facts" on packaged food The "Nutrition Facts" list is included on the labels of almost all packaged foods and beverages in the [place] It includes:  The serving size.  Information about nutrients in each serving, including the grams (g) of carbohydrate per serving. To use the "Nutrition Facts":  Decide how many servings you will have.  Multiply the number of servings by the number of carbohydrates per serving.  The resulting number is the total amount of carbohydrates that you will be having. Learning standard serving sizes of other foods When you eat carbohydrate foods that are not packaged or do not include "Nutrition Facts" on the label, you need to measure the servings in order to count the amount of carbohydrates:  Measure the foods that you will eat with a food scale or measuring cup, if needed.   Decide how many standard-size servings you will eat.  Multiply the number of servings by 15. Most carbohydrate-rich foods have about 15 g of carbohydrates per serving. ? For example, if you eat 8 oz (170 g) of strawberries, you will have eaten 2 servings and 30 g of carbohydrates (2 servings x 15 g = 30 g).  For foods that have more than one food mixed, such as soups and casseroles, you must count the carbohydrates in each food that is included. The following list contains standard serving sizes of common carbohydrate-rich foods. Each of these servings has about 15 g of carbohydrates:   hamburger bun or  English muffin.   oz (15 mL) syrup.   oz (14 g) jelly.  1 slice of bread.  1 six-inch tortilla.  3 oz (85 g) cooked rice or pasta.  4 oz (113 g) cooked dried beans.  4 oz (113 g) starchy vegetable, such as peas, corn, or potatoes.  4 oz (113 g) hot cereal.  4 oz (113 g) mashed potatoes or  of a large baked potato.  4 oz (113 g) canned or frozen fruit.  4 oz (120 mL) fruit juice.  4-6 crackers.  6 chicken nuggets.  6 oz (170 g) unsweetened dry cereal.  6 oz (170 g) plain fat-free yogurt or yogurt sweetened with artificial sweeteners.  8 oz (240 mL) milk.  8 oz (170 g) fresh fruit or one small piece of fruit.  24 oz (680 g) popped popcorn. Example of carbohydrate counting Sample meal  3 oz (85 g) chicken breast.  6 oz (170 g)   brown rice.  4 oz (113 g) corn.  8 oz (240 mL) milk.  8 oz (170 g) strawberries with sugar-free whipped topping. Carbohydrate calculation 1. Identify the foods that contain carbohydrates: ? Rice. ? Corn. ? Milk. ? Strawberries. 2. Calculate how many servings you have of each food: ? 2 servings rice. ? 1 serving corn. ? 1 serving milk. ? 1 serving strawberries. 3. Multiply each number of servings by 15 g: ? 2 servings rice x 15 g = 30 g. ? 1 serving corn x 15 g = 15 g. ? 1 serving milk x 15 g = 15 g. ? 1 serving  strawberries x 15 g = 15 g. 4. Add together all of the amounts to find the total grams of carbohydrates eaten: ? 30 g + 15 g + 15 g + 15 g = 75 g of carbohydrates total. Summary  Carbohydrate counting is a method of keeping track of how many carbohydrates you eat.  Eating carbohydrates naturally increases the amount of sugar (glucose) in the blood.  Counting how many carbohydrates you eat helps keep your blood glucose within normal limits, which helps you manage your diabetes.  A diet and nutrition specialist (registered dietitian) can help you make a meal plan and calculate how many carbohydrates you should have at each meal and snack. This information is not intended to replace advice given to you by your health care provider. Make sure you discuss any questions you have with your health care provider. Document Released: 06/14/2005 Document Revised: 01/06/2017 Document Reviewed: 11/26/2015 Elsevier Patient Education  2020 Elsevier Inc.  

## 2019-02-16 NOTE — Telephone Encounter (Signed)
Talked to [place] [facility]

## 2019-02-16 NOTE — Progress Notes (Signed)
Subjective:    Patient ID: Arthur Torres, male    DOB: 11-Jul-1950, 68 y.o.   MRN: [id number]   Chief Complaint: Medical Management of Chronic Issues    HPI:  1. Type 2 diabetes mellitus without complication, without long-term current use of insulin (HCC) Does not check blood sugars everyday. Is below 140 when he does check it. Denies any symptoms of low blood sugar Lab Results  Component Value Date   HGBA1C 7.4 (H) 11/13/2018     2. Hyperlipidemia with target LDL less than 100 Tries to watch diet but does very little exercise. Lab Results  Component Value Date   CHOL 121 11/13/2018   HDL 37 (L) 11/13/2018   LDLCALC 54 11/13/2018   TRIG 150 (H) 11/13/2018   CHOLHDL 3.3 11/13/2018     3. Essential hypertension, benign No c/o chest pain, sob or headache. Dos not check blood pressure at home. BP Readings from Last 3 Encounters:  11/13/18 106/65  09/11/18 111/61  08/14/18 126/79     4. BMI 27.0-27.9,adult No recent weight chnages    Outpatient Encounter Medications as of 02/16/2019  Medication Sig  . aspirin 325 MG EC tablet Take 325 mg by mouth daily.  . metFORMIN (GLUCOPHAGE) 1000 MG tablet TAKE 1 TABLET (1,000 MG TOTAL) BY MOUTH 2 (TWO) TIMES DAILY WITH A MEAL.  . metoprolol succinate (TOPROL-XL) 50 MG 24 hr tablet TAKE 1 TABLET BY MOUTH EVERY DAY  . pantoprazole (PROTONIX) 40 MG tablet Take 1 tablet (40 mg total) by mouth daily.  . rosuvastatin (CRESTOR) 10 MG tablet Take 1 tablet (10 mg total) by mouth daily.    Past Surgical History:  Procedure Laterality Date  . CARDIAC CATHETERIZATION    . COLON SURGERY     hemrrhoids  . CORONARY ANGIOPLASTY WITH STENT PLACEMENT      Family History  Problem Relation Age of Onset  . Diabetes Mother   . Stroke Mother   . Cancer Father        skin, prostate, stomach    New complaints: None today  Social history: Lives with wife  Controlled substance contract: n/a    Review of Systems  Constitutional:  Negative for activity change and appetite change.  HENT: Negative.   Eyes: Negative for pain.  Respiratory: Negative for shortness of breath.   Cardiovascular: Negative for chest pain, palpitations and leg swelling.  Gastrointestinal: Negative for abdominal pain.  Endocrine: Negative for polydipsia.  Genitourinary: Negative.   Skin: Negative for rash.  Neurological: Negative for dizziness, weakness and headaches.  Hematological: Does not bruise/bleed easily.  Psychiatric/Behavioral: Negative.   All other systems reviewed and are negative.      Objective:   Physical Exam Vitals signs and nursing note reviewed.  Constitutional:      Appearance: Normal appearance. He is well-developed.  HENT:     Head: Normocephalic.     Nose: Nose normal.  Eyes:     Pupils: Pupils are equal, round, and reactive to light.  Neck:     Musculoskeletal: Normal range of motion and neck supple.     Thyroid: No thyroid mass or thyromegaly.     Vascular: No carotid bruit or JVD.     Trachea: Phonation normal.  Cardiovascular:     Rate and Rhythm: Normal rate and regular rhythm.  Pulmonary:     Effort: Pulmonary effort is normal. No respiratory distress.     Breath sounds: Normal breath sounds.  Abdominal:  General: Bowel sounds are normal.     Palpations: Abdomen is soft.     Tenderness: There is no abdominal tenderness.  Musculoskeletal: Normal range of motion.  Lymphadenopathy:     Cervical: No cervical adenopathy.  Skin:    General: Skin is warm and dry.  Neurological:     Mental Status: He is alert and oriented to person, place, and time.  Psychiatric:        Behavior: Behavior normal.        Thought Content: Thought content normal.        Judgment: Judgment normal.    BP 130/82   Pulse (!) 58   Temp (!) 96.6 F (35.9 C) (Temporal)   Ht 5' 10"  (1.778 m)   Wt 199 lb 6.4 oz (90.4 kg)   BMI 28.61 kg/m   hgba1c 8.1%     Assessment & Plan:  Arthur Torres comes in today with  chief complaint of Medical Management of Chronic Issues   Diagnosis and orders addressed:  1. Type 2 diabetes mellitus without complication, without long-term current use of insulin (HCC) Strict carb counting Really need to check blood sugars daily fasting added januvia to meds - Bayer DCA Hb A1c Waived - sitaGLIPtin (JANUVIA) 100 MG tablet; Take 1 tablet (100 mg total) by mouth daily.  Dispense: 30 tablet; Refill: 5 - metFORMIN (GLUCOPHAGE) 1000 MG tablet; Take 1 tablet (1,000 mg total) by mouth 2 (two) times daily with a meal.  Dispense: 180 tablet; Refill: 1  2. Hyperlipidemia with target LDL less than 100 Low fat diet - CMP14+EGFR - Lipid panel - rosuvastatin (CRESTOR) 10 MG tablet; Take 1 tablet (10 mg total) by mouth daily.  Dispense: 90 tablet; Refill: 1  3. Essential hypertension, benign Low sodium diet - metoprolol succinate (TOPROL-XL) 50 MG 24 hr tablet; Take 1 tablet (50 mg total) by mouth daily. Take with or immediately following a meal.  Dispense: 90 tablet; Refill: 1  4. BMI 27.0-27.9,adult Discussed diet and exercise for person with BMI >25 Will recheck weight in 3-6 months  5. Acute gastritis without hemorrhage, unspecified gastritis type Avoid spicy foods Do not eat 2 hours prior to bedtime - pantoprazole (PROTONIX) 40 MG tablet; Take 1 tablet (40 mg total) by mouth daily.  Dispense: 90 tablet; Refill: 1   Labs pending Health Maintenance reviewed Diet and exercise encouraged  Follow up plan: 3 months   [name] Done, FNP

## 2019-02-17 LAB — LIPID PANEL
Chol/HDL Ratio: 2.9 ratio (ref 0.0–5.0)
Cholesterol, Total: 106 mg/dL (ref 100–199)
HDL: 37 mg/dL — ABNORMAL LOW (ref >39)
LDL Calculated: 42 mg/dL (ref 0–99)
Triglycerides: 133 mg/dL (ref 0–149)
VLDL Cholesterol Cal: 27 mg/dL (ref 5–40)

## 2019-02-17 LAB — CMP14+EGFR
ALT: 36 IU/L (ref 0–44)
AST: 22 IU/L (ref 0–40)
Albumin/Globulin Ratio: 1.9 (ref 1.2–2.2)
Albumin: 4.6 g/dL (ref 3.8–4.8)
Alkaline Phosphatase: 73 IU/L (ref 39–117)
BUN/Creatinine Ratio: 19 (ref 10–24)
BUN: 19 mg/dL (ref 8–27)
Bilirubin Total: 0.4 mg/dL (ref 0.0–1.2)
CO2: 22 mmol/L (ref 20–29)
Calcium: 9.4 mg/dL (ref 8.6–10.2)
Chloride: 101 mmol/L (ref 96–106)
Creatinine, Ser: 1.02 mg/dL (ref 0.76–1.27)
GFR calc Af Amer: 89 mL/min/{1.73_m2} (ref >59)
GFR calc non Af Amer: 77 mL/min/{1.73_m2} (ref >59)
Globulin, Total: 2.4 g/dL (ref 1.5–4.5)
Glucose: 173 mg/dL — ABNORMAL HIGH (ref 65–99)
Potassium: 4.4 mmol/L (ref 3.5–5.2)
Sodium: 138 mmol/L (ref 134–144)
Total Protein: 7 g/dL (ref 6.0–8.5)

## 2019-03-26 ENCOUNTER — Encounter: Payer: Self-pay | Admitting: Physician Assistant

## 2019-03-26 ENCOUNTER — Ambulatory Visit (INDEPENDENT_AMBULATORY_CARE_PROVIDER_SITE_OTHER): Payer: Medicare HMO | Admitting: Physician Assistant

## 2019-03-26 ENCOUNTER — Other Ambulatory Visit: Payer: Self-pay

## 2019-03-26 VITALS — BP 113/64 | HR 69 | Temp 96.2°F | Ht 70.0 in | Wt 195.6 lb

## 2019-03-26 DIAGNOSIS — L03031 Cellulitis of right toe: Secondary | ICD-10-CM | POA: Diagnosis not present

## 2019-03-26 MED ORDER — CEPHALEXIN 500 MG PO CAPS: 500.0000 mg | ORAL_CAPSULE | Freq: Three times a day (TID) | ORAL | 1 refills | Status: DC

## 2019-03-26 NOTE — Patient Instructions (Signed)

## 2019-03-28 NOTE — Progress Notes (Signed)
BP 113/64   Pulse 69   Temp (!) 96.2 F (35.7 C) (Temporal)   Ht _0  (1.778 m)   Wt 195 lb 9.6 oz (88.7 kg)   SpO2 98%   BMI 28.07 kg/m    Subjective:    Patient ID: Arthur Torres, male    DOB: 1950-07-14, 68 y.o.   MRN: [id number]  HPI: FERLIN FAIRHURST is a 68 y.o. male presenting on 03/26/2019 for infected toe  Mr. Kushnir comes in because he has had swelling in his toe over the past few days.  He is a diabetic so he was concerned that he did not want to get significantly infected he admits that the toenail was growing deep into the lateral corner of the right great toe.  He cut the nail down and closed.  Any meds that it did bleed.  He tried to keep it clean but it is beginning to have some redness and swelling.  He denies seeing any pus.  His second toe does have a deformed toe and it is quite thick and yellow.  We have discussed that he may have a Nicomide coconuts and this toenail and it is possible he may need to see podiatry about having it completely removed given his diabetic status.  Past Medical History:  Diagnosis Date  . CAD (coronary artery disease)    1999 occluded LAD treated with PCI and stenting, 80% stenosis of the first diagonal feeling angioplasty, 60-70% second stenosis, 50% ostial left main stenosis, 25% right coronary artery stenosis, 50% ostial PDA stenosis.  . Cancer [facility])    prostate (treated with radiation)  . Diabetes mellitus without complication ([facility])   . Hyperlipidemia   . Hypertension   . Joint pain    Relevant past medical, surgical, family and social history reviewed and updated as indicated. Interim medical history since our last visit reviewed. Allergies and medications reviewed and updated. DATA REVIEWED: CHART IN EPIC  Family History reviewed for pertinent findings.  Review of Systems  Constitutional: Negative.  Negative for appetite change and fatigue.  Eyes: Negative for pain and visual disturbance.  Respiratory: Negative.   Negative for cough, chest tightness, shortness of breath and wheezing.   Cardiovascular: Negative.  Negative for chest pain, palpitations and leg swelling.  Gastrointestinal: Negative.  Negative for abdominal pain, diarrhea, nausea and vomiting.  Genitourinary: Negative.   Musculoskeletal: Positive for joint swelling.  Skin: Positive for color change, rash and wound.  Neurological: Negative.  Negative for weakness, numbness and headaches.  Psychiatric/Behavioral: Negative.     Allergies as of 03/26/2019      Reactions   Penicillins    Zocor [simvastatin]       Medication List       Accurate as of March 26, 2019 11:59 PM. If you have any questions, ask your nurse or doctor.        aspirin 325 MG EC tablet Take 325 mg by mouth daily.   cephALEXin 500 MG capsule Commonly known as: Keflex Take 1 capsule (500 mg total) by mouth 3 (three) times daily. Started by: Terald Sleeper, PA-C   metFORMIN 1000 MG tablet Commonly known as: GLUCOPHAGE Take 1 tablet (1,000 mg total) by mouth 2 (two) times daily with a meal.   metoprolol succinate 50 MG 24 hr tablet Commonly known as: TOPROL-XL Take 1 tablet (50 mg total) by mouth daily. Take with or immediately following a meal.   pantoprazole 40 MG tablet Commonly  known as: PROTONIX Take 1 tablet (40 mg total) by mouth daily.   rosuvastatin 10 MG tablet Commonly known as: Crestor Take 1 tablet (10 mg total) by mouth daily.   sitaGLIPtin 100 MG tablet Commonly known as: Januvia Take 1 tablet (100 mg total) by mouth daily.          Objective:    BP 113/64   Pulse 69   Temp (!) 96.2 F (35.7 C) (Temporal)   Ht _0  (1.778 m)   Wt 195 lb 9.6 oz (88.7 kg)   SpO2 98%   BMI 28.07 kg/m   Allergies  Allergen Reactions  . Penicillins   . Zocor [Simvastatin]     Wt Readings from Last 3 Encounters:  03/26/19 195 lb 9.6 oz (88.7 kg)  02/16/19 199 lb 6.4 oz (90.4 kg)  11/13/18 197 lb (89.4 kg)    Physical Exam Vitals  signs and nursing note reviewed.  Constitutional:      General: He is not in acute distress.    Appearance: He is well-developed.  HENT:     Head: Normocephalic and atraumatic.  Eyes:     Conjunctiva/sclera: Conjunctivae normal.     Pupils: Pupils are equal, round, and reactive to light.  Cardiovascular:     Rate and Rhythm: Normal rate and regular rhythm.     Heart sounds: Normal heart sounds.  Pulmonary:     Effort: Pulmonary effort is normal. No respiratory distress.     Breath sounds: Normal breath sounds.  Skin:    General: Skin is warm and dry.     Comments: Right great toe with lateral toe deformity and swelling around the toenail second toe with deformed thickened yellow toenail  Psychiatric:        Behavior: Behavior normal.     Results for orders placed or performed in visit on 02/16/19  CMP14+EGFR  Result Value Ref Range   Glucose 173 (H) 65 - 99 mg/dL   BUN 19 8 - 27 mg/dL   Creatinine, Ser 1.02 0.76 - 1.27 mg/dL   GFR calc non Af Amer 77 >59 mL/min/1.73   GFR calc Af Amer 89 >59 mL/min/1.73   BUN/Creatinine Ratio 19 10 - 24   Sodium 138 134 - 144 mmol/L   Potassium 4.4 3.5 - 5.2 mmol/L   Chloride 101 96 - 106 mmol/L   CO2 22 20 - 29 mmol/L   Calcium 9.4 8.6 - 10.2 mg/dL   Total Protein 7.0 6.0 - 8.5 g/dL   Albumin 4.6 3.8 - 4.8 g/dL   Globulin, Total 2.4 1.5 - 4.5 g/dL   Albumin/Globulin Ratio 1.9 1.2 - 2.2   Bilirubin Total 0.4 0.0 - 1.2 mg/dL   Alkaline Phosphatase 73 39 - 117 IU/L   AST 22 0 - 40 IU/L   ALT 36 0 - 44 IU/L  Lipid panel  Result Value Ref Range   Cholesterol, Total 106 100 - 199 mg/dL   Triglycerides 133 0 - 149 mg/dL   HDL 37 (L) >39 mg/dL   VLDL Cholesterol Cal 27 5 - 40 mg/dL   LDL Calculated 42 0 - 99 mg/dL   Chol/HDL Ratio 2.9 0.0 - 5.0 ratio  Bayer DCA Hb A1c Waived  Result Value Ref Range   HB A1C (BAYER DCA - WAIVED) 8.1 (H) <7.0 %      Assessment & Plan:   1. Cellulitis of toe of right foot - cephALEXin (KEFLEX) 500  MG capsule; Take 1 capsule (500  mg total) by mouth 3 (three) times daily.  Dispense: 30 capsule; Refill: 1    Continue all other maintenance medications as listed above.  Follow up plan: No follow-ups on file.  Educational handout given for Albert PA-C [facility] [address]  [place], [place] [address] [phone]   03/28/2019, 1:25 PM

## 2019-05-21 ENCOUNTER — Other Ambulatory Visit: Payer: Self-pay

## 2019-05-21 ENCOUNTER — Ambulatory Visit (INDEPENDENT_AMBULATORY_CARE_PROVIDER_SITE_OTHER): Payer: Medicare HMO | Admitting: Nurse Practitioner

## 2019-05-21 ENCOUNTER — Encounter: Payer: Self-pay | Admitting: Nurse Practitioner

## 2019-05-21 VITALS — BP 127/77 | HR 72 | Temp 98.4°F | Resp 20 | Ht 70.0 in | Wt 192.0 lb

## 2019-05-21 DIAGNOSIS — E785 Hyperlipidemia, unspecified: Secondary | ICD-10-CM

## 2019-05-21 DIAGNOSIS — I10 Essential (primary) hypertension: Secondary | ICD-10-CM

## 2019-05-21 DIAGNOSIS — Z6827 Body mass index (BMI) 27.0-27.9, adult: Secondary | ICD-10-CM | POA: Diagnosis not present

## 2019-05-21 DIAGNOSIS — E119 Type 2 diabetes mellitus without complications: Secondary | ICD-10-CM | POA: Diagnosis not present

## 2019-05-21 LAB — BAYER DCA HB A1C WAIVED: HB A1C (BAYER DCA - WAIVED): 7.4 % — ABNORMAL HIGH (ref <7.0)

## 2019-05-21 NOTE — Patient Instructions (Signed)
Diabetes Mellitus and Foot Care Foot care is an important part of your health, especially when you have diabetes. Diabetes may cause you to have problems because of poor blood flow (circulation) to your feet and legs, which can cause your skin to:  Become thinner and drier.  Break more easily.  Heal more slowly.  Peel and crack. You may also have nerve damage (neuropathy) in your legs and feet, causing decreased feeling in them. This means that you may not notice minor injuries to your feet that could lead to more serious problems. Noticing and addressing any potential problems early is the best way to prevent future foot problems. How to care for your feet Foot hygiene  Wash your feet daily with warm water and mild soap. Do not use hot water. Then, pat your feet and the areas between your toes until they are completely dry. Do not soak your feet as this can dry your skin.  Trim your toenails straight across. Do not dig under them or around the cuticle. File the edges of your nails with an emery board or nail file.  Apply a moisturizing lotion or petroleum jelly to the skin on your feet and to dry, brittle toenails. Use lotion that does not contain alcohol and is unscented. Do not apply lotion between your toes. Shoes and socks  Wear clean socks or stockings every day. Make sure they are not too tight. Do not wear knee-high stockings since they may decrease blood flow to your legs.  Wear shoes that fit properly and have enough cushioning. Always look in your shoes before you put them on to be sure there are no objects inside.  To break in new shoes, wear them for just a few hours a day. This prevents injuries on your feet. Wounds, scrapes, corns, and calluses  Check your feet daily for blisters, cuts, bruises, sores, and redness. If you cannot see the bottom of your feet, use a mirror or ask someone for help.  Do not cut corns or calluses or try to remove them with medicine.  If you  find a minor scrape, cut, or break in the skin on your feet, keep it and the skin around it clean and dry. You may clean these areas with mild soap and water. Do not clean the area with peroxide, alcohol, or iodine.  If you have a wound, scrape, corn, or callus on your foot, look at it several times a day to make sure it is healing and not infected. Check for: ? Redness, swelling, or pain. ? Fluid or blood. ? Warmth. ? Pus or a bad smell. General instructions  Do not cross your legs. This may decrease blood flow to your feet.  Do not use heating pads or hot water bottles on your feet. They may burn your skin. If you have lost feeling in your feet or legs, you may not know this is happening until it is too late.  Protect your feet from hot and cold by wearing shoes, such as at the beach or on hot pavement.  Schedule a complete foot exam at least once a year (annually) or more often if you have foot problems. If you have foot problems, report any cuts, sores, or bruises to your health care provider immediately. Contact a health care provider if:  You have a medical condition that increases your risk of infection and you have any cuts, sores, or bruises on your feet.  You have an injury that is not   healing.  You have redness on your legs or feet.  You feel burning or tingling in your legs or feet.  You have pain or cramps in your legs and feet.  Your legs or feet are numb.  Your feet always feel cold.  You have pain around a toenail. Get help right away if:  You have a wound, scrape, corn, or callus on your foot and: ? You have pain, swelling, or redness that gets worse. ? You have fluid or blood coming from the wound, scrape, corn, or callus. ? Your wound, scrape, corn, or callus feels warm to the touch. ? You have pus or a bad smell coming from the wound, scrape, corn, or callus. ? You have a fever. ? You have a red line going up your leg. Summary  Check your feet every day  for cuts, sores, red spots, swelling, and blisters.  Moisturize feet and legs daily.  Wear shoes that fit properly and have enough cushioning.  If you have foot problems, report any cuts, sores, or bruises to your health care provider immediately.  Schedule a complete foot exam at least once a year (annually) or more often if you have foot problems. This information is not intended to replace advice given to you by your health care provider. Make sure you discuss any questions you have with your health care provider. Document Released: 06/11/2000 Document Revised: 07/27/2017 Document Reviewed: 07/16/2016 Elsevier Patient Education  2020 Elsevier Inc[employer]  

## 2019-05-21 NOTE — Progress Notes (Signed)
Subjective:    Patient ID: Arthur Torres, male    DOB: [birth year], 68 y.o.   MRN: [id number]   Chief Complaint: Medical Management of Chronic Issues    HPI:  1. Type 2 diabetes mellitus without complication, without long-term current use of insulin (HCC) Hs blood sugars have been rnning below 150 but stopped checking it 2 months ago because he got tired of pricking his finger. He was suppose to have started on januvia but did not get rx becauseit was so expensive. He did not call and et [place] know. He said he has cut out sweets and thinks he is dong better.   2. Hyperlipidemia with target LDL less than 100 Does not watch diet and does very little exercise. He does stay very active. Lab Results  Component Value Date   CHOL 106 02/16/2019   HDL 37 (L) 02/16/2019   LDLCALC 42 02/16/2019   TRIG 133 02/16/2019   CHOLHDL 2.9 02/16/2019     3. Essential hypertension, benign no c/o chest pain, sob or headache. Does not check blood pressure at hime. BP Readings from Last 3 Encounters:  03/26/19 113/64  02/16/19 130/82  11/13/18 106/65     4. BMI 27.0-27.9,adult Weight is down 4-5 lbs since last visit Wt Readings from Last 3 Encounters:  03/26/19 195 lb 9.6 oz (88.7 kg)  02/16/19 199 lb 6.4 oz (90.4 kg)  11/13/18 197 lb (89.4 kg)   BMI Readings from Last 3 Encounters:  03/26/19 28.07 kg/m  02/16/19 28.61 kg/m  11/13/18 28.27 kg/m       Outpatient Encounter Medications as of 05/21/2019  Medication Sig  . aspirin 325 MG EC tablet Take 325 mg by mouth daily.  . cephALEXin (KEFLEX) 500 MG capsule Take 1 capsule (500 mg total) by mouth 3 (three) times daily.  . metFORMIN (GLUCOPHAGE) 1000 MG tablet Take 1 tablet (1,000 mg total) by mouth 2 (two) times daily with a meal.  . metoprolol succinate (TOPROL-XL) 50 MG 24 hr tablet Take 1 tablet (50 mg total) by mouth daily. Take with or immediately following a meal.  . pantoprazole (PROTONIX) 40 MG tablet Take 1 tablet (40 mg  total) by mouth daily.  . rosuvastatin (CRESTOR) 10 MG tablet Take 1 tablet (10 mg total) by mouth daily.  . sitaGLIPtin (JANUVIA) 100 MG tablet Take 1 tablet (100 mg total) by mouth daily.    Past Surgical History:  Procedure Laterality Date  . CARDIAC CATHETERIZATION    . COLON SURGERY     hemrrhoids  . CORONARY ANGIOPLASTY WITH STENT PLACEMENT      Family History  Problem Relation Age of Onset  . Diabetes Mother   . Stroke Mother   . Cancer Father        skin, prostate, stomach    New complaints: None today  Social history: Lives with his wife.  Controlled substance contract: n/a     Review of Systems  Constitutional: Negative for activity change and appetite change.  HENT: Negative.   Eyes: Negative for pain.  Respiratory: Negative for shortness of breath.   Cardiovascular: Negative for chest pain, palpitations and leg swelling.  Gastrointestinal: Negative for abdominal pain.  Endocrine: Negative for polydipsia.  Genitourinary: Negative.   Skin: Negative for rash.  Neurological: Negative for dizziness, weakness and headaches.  Hematological: Does not bruise/bleed easily.  Psychiatric/Behavioral: Negative.   All other systems reviewed and are negative.      Objective:   Physical Exam Vitals signs  and nursing note reviewed.  Constitutional:      Appearance: Normal appearance. He is well-developed.  HENT:     Head: Normocephalic.     Nose: Nose normal.  Eyes:     Pupils: Pupils are equal, round, and reactive to light.  Neck:     Musculoskeletal: Normal range of motion and neck supple.     Thyroid: No thyroid mass or thyromegaly.     Vascular: No carotid bruit or JVD.     Trachea: Phonation normal.  Cardiovascular:     Rate and Rhythm: Normal rate and regular rhythm.  Pulmonary:     Effort: Pulmonary effort is normal. No respiratory distress.     Breath sounds: Normal breath sounds.  Abdominal:     General: Bowel sounds are normal.      Palpations: Abdomen is soft.     Tenderness: There is no abdominal tenderness.  Musculoskeletal: Normal range of motion.  Lymphadenopathy:     Cervical: No cervical adenopathy.  Skin:    General: Skin is warm and dry.  Neurological:     Mental Status: He is alert and oriented to person, place, and time.  Psychiatric:        Behavior: Behavior normal.        Thought Content: Thought content normal.        Judgment: Judgment normal.    BP 127/77   Pulse 72   Temp 98.4 F (36.9 C) (Temporal)   Resp 20   Ht 5' 10" (1.778 m)   Wt 192 lb (87.1 kg)   SpO2 98%   BMI 27.55 kg/m    hgba1c 7.4%     Assessment & Plan:  Arthur Torres comes in today with chief complaint of Medical Management of Chronic Issues   Diagnosis and orders addressed:  1. Type 2 diabetes mellitus without complication, without long-term current use of insulin (HCC) Continue carb counting - hgba1c - Microalbumin / creatinine urine ratio  2. Hyperlipidemia with target LDL less than 100 Low fat diet - Lipid panel  3. Essential hypertension, benign Low sodium diet - CMP14+EGFR  4. BMI 27.0-27.9,adult Discussed diet and exercise for person with BMI >25 Will recheck weight in 3-6 months   Labs pending Health Maintenance reviewed Diet and exercise encouraged  Follow up plan: 3 months   Mary-Margaret Martin, FNP  

## 2019-05-22 LAB — CMP14+EGFR
ALT: 26 IU/L (ref 0–44)
AST: 16 IU/L (ref 0–40)
Albumin/Globulin Ratio: 2.3 — ABNORMAL HIGH (ref 1.2–2.2)
Albumin: 4.8 g/dL (ref 3.8–4.8)
Alkaline Phosphatase: 83 IU/L (ref 39–117)
BUN/Creatinine Ratio: 19 (ref 10–24)
BUN: 22 mg/dL (ref 8–27)
Bilirubin Total: 0.4 mg/dL (ref 0.0–1.2)
CO2: 22 mmol/L (ref 20–29)
Calcium: 9.7 mg/dL (ref 8.6–10.2)
Chloride: 99 mmol/L (ref 96–106)
Creatinine, Ser: 1.14 mg/dL (ref 0.76–1.27)
GFR calc Af Amer: 78 mL/min/{1.73_m2} (ref >59)
GFR calc non Af Amer: 67 mL/min/{1.73_m2} (ref >59)
Globulin, Total: 2.1 g/dL (ref 1.5–4.5)
Glucose: 122 mg/dL — ABNORMAL HIGH (ref 65–99)
Potassium: 4.3 mmol/L (ref 3.5–5.2)
Sodium: 137 mmol/L (ref 134–144)
Total Protein: 6.9 g/dL (ref 6.0–8.5)

## 2019-05-22 LAB — LIPID PANEL
Chol/HDL Ratio: 2.9 ratio (ref 0.0–5.0)
Cholesterol, Total: 115 mg/dL (ref 100–199)
HDL: 39 mg/dL — ABNORMAL LOW (ref >39)
LDL Chol Calc (NIH): 50 mg/dL (ref 0–99)
Triglycerides: 154 mg/dL — ABNORMAL HIGH (ref 0–149)
VLDL Cholesterol Cal: 26 mg/dL (ref 5–40)

## 2019-05-22 LAB — MICROALBUMIN / CREATININE URINE RATIO
Creatinine, Urine: 128.2 mg/dL
Microalb/Creat Ratio: 7 mg/g creat (ref 0–29)
Microalbumin, Urine: 9.1 ug/mL

## 2019-06-19 ENCOUNTER — Encounter: Payer: Self-pay | Admitting: Family Medicine

## 2019-06-19 ENCOUNTER — Other Ambulatory Visit: Payer: Self-pay

## 2019-06-19 ENCOUNTER — Ambulatory Visit (INDEPENDENT_AMBULATORY_CARE_PROVIDER_SITE_OTHER): Payer: Medicare HMO | Admitting: Family Medicine

## 2019-06-19 DIAGNOSIS — R05 Cough: Secondary | ICD-10-CM | POA: Diagnosis not present

## 2019-06-19 NOTE — Progress Notes (Signed)
Pt. Went to urgent care. WS

## 2019-08-20 ENCOUNTER — Other Ambulatory Visit: Payer: Self-pay

## 2019-08-20 ENCOUNTER — Other Ambulatory Visit: Payer: Self-pay | Admitting: Nurse Practitioner

## 2019-08-20 DIAGNOSIS — K2900 Acute gastritis without bleeding: Secondary | ICD-10-CM

## 2019-08-21 ENCOUNTER — Encounter: Payer: Self-pay | Admitting: Nurse Practitioner

## 2019-08-21 ENCOUNTER — Ambulatory Visit (INDEPENDENT_AMBULATORY_CARE_PROVIDER_SITE_OTHER): Payer: Medicare HMO | Admitting: Nurse Practitioner

## 2019-08-21 VITALS — BP 133/81 | HR 86 | Temp 98.0°F | Resp 20 | Ht 70.0 in | Wt 195.0 lb

## 2019-08-21 DIAGNOSIS — E785 Hyperlipidemia, unspecified: Secondary | ICD-10-CM

## 2019-08-21 DIAGNOSIS — Z23 Encounter for immunization: Secondary | ICD-10-CM | POA: Diagnosis not present

## 2019-08-21 DIAGNOSIS — Z1212 Encounter for screening for malignant neoplasm of rectum: Secondary | ICD-10-CM | POA: Diagnosis not present

## 2019-08-21 DIAGNOSIS — Z1211 Encounter for screening for malignant neoplasm of colon: Secondary | ICD-10-CM | POA: Diagnosis not present

## 2019-08-21 DIAGNOSIS — I10 Essential (primary) hypertension: Secondary | ICD-10-CM

## 2019-08-21 DIAGNOSIS — Z8546 Personal history of malignant neoplasm of prostate: Secondary | ICD-10-CM

## 2019-08-21 DIAGNOSIS — K2900 Acute gastritis without bleeding: Secondary | ICD-10-CM

## 2019-08-21 DIAGNOSIS — E119 Type 2 diabetes mellitus without complications: Secondary | ICD-10-CM | POA: Diagnosis not present

## 2019-08-21 DIAGNOSIS — Z6827 Body mass index (BMI) 27.0-27.9, adult: Secondary | ICD-10-CM

## 2019-08-21 LAB — BAYER DCA HB A1C WAIVED: HB A1C (BAYER DCA - WAIVED): 7.6 % — ABNORMAL HIGH (ref <7.0)

## 2019-08-21 MED ORDER — METFORMIN HCL 1000 MG PO TABS: 1000.0000 mg | ORAL_TABLET | Freq: Two times a day (BID) | ORAL | 1 refills | Status: DC

## 2019-08-21 MED ORDER — PANTOPRAZOLE SODIUM 40 MG PO TBEC: 40.0000 mg | DELAYED_RELEASE_TABLET | Freq: Every day | ORAL | 1 refills | Status: DC

## 2019-08-21 MED ORDER — ROSUVASTATIN CALCIUM 10 MG PO TABS: 10.0000 mg | ORAL_TABLET | Freq: Every day | ORAL | 1 refills | Status: DC

## 2019-08-21 NOTE — Patient Instructions (Signed)
Carbohydrate Counting for Diabetes Mellitus, Adult  Carbohydrate counting is a method of keeping track of how many carbohydrates you eat. Eating carbohydrates naturally increases the amount of sugar (glucose) in the blood. Counting how many carbohydrates you eat helps keep your blood glucose within normal limits, which helps you manage your diabetes (diabetes mellitus). It is important to know how many carbohydrates you can safely have in each meal. This is different for every person. A diet and nutrition specialist (registered dietitian) can help you make a meal plan and calculate how many carbohydrates you should have at each meal and snack. Carbohydrates are found in the following foods:  Grains, such as breads and cereals.  Dried beans and soy products.  Starchy vegetables, such as potatoes, peas, and corn.  Fruit and fruit juices.  Milk and yogurt.  Sweets and snack foods, such as cake, cookies, candy, chips, and soft drinks. How do I count carbohydrates? There are two ways to count carbohydrates in food. You can use either of the methods or a combination of both. Reading "Nutrition Facts" on packaged food The "Nutrition Facts" list is included on the labels of almost all packaged foods and beverages in the [place] It includes:  The serving size.  Information about nutrients in each serving, including the grams (g) of carbohydrate per serving. To use the "Nutrition Facts":  Decide how many servings you will have.  Multiply the number of servings by the number of carbohydrates per serving.  The resulting number is the total amount of carbohydrates that you will be having. Learning standard serving sizes of other foods When you eat carbohydrate foods that are not packaged or do not include "Nutrition Facts" on the label, you need to measure the servings in order to count the amount of carbohydrates:  Measure the foods that you will eat with a food scale or measuring cup, if  needed.  Decide how many standard-size servings you will eat.  Multiply the number of servings by 15. Most carbohydrate-rich foods have about 15 g of carbohydrates per serving. ? For example, if you eat 8 oz (170 g) of strawberries, you will have eaten 2 servings and 30 g of carbohydrates (2 servings x 15 g = 30 g).  For foods that have more than one food mixed, such as soups and casseroles, you must count the carbohydrates in each food that is included. The following list contains standard serving sizes of common carbohydrate-rich foods. Each of these servings has about 15 g of carbohydrates:   hamburger bun or  English muffin.   oz (15 mL) syrup.   oz (14 g) jelly.  1 slice of bread.  1 six-inch tortilla.  3 oz (85 g) cooked rice or pasta.  4 oz (113 g) cooked dried beans.  4 oz (113 g) starchy vegetable, such as peas, corn, or potatoes.  4 oz (113 g) hot cereal.  4 oz (113 g) mashed potatoes or  of a large baked potato.  4 oz (113 g) canned or frozen fruit.  4 oz (120 mL) fruit juice.  4-6 crackers.  6 chicken nuggets.  6 oz (170 g) unsweetened dry cereal.  6 oz (170 g) plain fat-free yogurt or yogurt sweetened with artificial sweeteners.  8 oz (240 mL) milk.  8 oz (170 g) fresh fruit or one small piece of fruit.  24 oz (680 g) popped popcorn. Example of carbohydrate counting Sample meal  3 oz (85 g) chicken breast.  6 oz (170 g)   brown rice.  4 oz (113 g) corn.  8 oz (240 mL) milk.  8 oz (170 g) strawberries with sugar-free whipped topping. Carbohydrate calculation 1. Identify the foods that contain carbohydrates: ? Rice. ? Corn. ? Milk. ? Strawberries. 2. Calculate how many servings you have of each food: ? 2 servings rice. ? 1 serving corn. ? 1 serving milk. ? 1 serving strawberries. 3. Multiply each number of servings by 15 g: ? 2 servings rice x 15 g = 30 g. ? 1 serving corn x 15 g = 15 g. ? 1 serving milk x 15 g = 15 g. ? 1  serving strawberries x 15 g = 15 g. 4. Add together all of the amounts to find the total grams of carbohydrates eaten: ? 30 g + 15 g + 15 g + 15 g = 75 g of carbohydrates total. Summary  Carbohydrate counting is a method of keeping track of how many carbohydrates you eat.  Eating carbohydrates naturally increases the amount of sugar (glucose) in the blood.  Counting how many carbohydrates you eat helps keep your blood glucose within normal limits, which helps you manage your diabetes.  A diet and nutrition specialist (registered dietitian) can help you make a meal plan and calculate how many carbohydrates you should have at each meal and snack. This information is not intended to replace advice given to you by your health care provider. Make sure you discuss any questions you have with your health care provider. Document Revised: 01/06/2017 Document Reviewed: 11/26/2015 Elsevier Patient Education  2020 Elsevier Inc.  

## 2019-08-21 NOTE — Progress Notes (Signed)
Subjective:    Patient ID: Arthur Torres, male    DOB: [birth year], 69 y.o.   MRN: [id number]   Chief Complaint: Medical Management of Chronic Issues    HPI:  1. Type 2 diabetes mellitus without complication, without long-term current use of insulin ([place]) He does not check blood sugars at home. He HATES pricking his finger. He denies any symptoms of low blood sugars. Lab Results  Component Value Date   HGBA1C 7.4 (H) 05/21/2019     2. Hyperlipidemia with target LDL less than 100 Does watch diet and does very little exercise. Lab Results  Component Value Date   CHOL 115 05/21/2019   HDL 39 (L) 05/21/2019   LDLCALC 50 05/21/2019   TRIG 154 (H) 05/21/2019   CHOLHDL 2.9 05/21/2019     3. Essential hypertension, benign No c/o chest pian , sob or headache. Does not check blood pressure at home. BP Readings from Last 3 Encounters:  08/21/19 133/81  05/21/19 127/77  03/26/19 113/64    4. Need for pneumococcal vaccination Getting today  5. BMI 27.0-27.9,adult No recent weight changes. Wt Readings from Last 3 Encounters:  08/21/19 195 lb (88.5 kg)  05/21/19 192 lb (87.1 kg)  03/26/19 195 lb 9.6 oz (88.7 kg)     6. H/O prostate cancer Had radiation treatments. Has not seen urology in years. Lab Results  Component Value Date   PSA1 <0.1 08/08/2017   PSA1 0.1 01/22/2016   PSA 0.2 09/03/2013        Outpatient Encounter Medications as of 08/21/2019  Medication Sig  . aspirin 325 MG EC tablet Take 325 mg by mouth daily.  . metFORMIN (GLUCOPHAGE) 1000 MG tablet Take 1 tablet (1,000 mg total) by mouth 2 (two) times daily with a meal.  . metoprolol succinate (TOPROL-XL) 50 MG 24 hr tablet Take 1 tablet (50 mg total) by mouth daily. Take with or immediately following a meal.  . pantoprazole (PROTONIX) 40 MG tablet TAKE 1 TABLET BY MOUTH EVERY DAY  . rosuvastatin (CRESTOR) 10 MG tablet Take 1 tablet (10 mg total) by mouth daily.     Past Surgical History:    Procedure Laterality Date  . CARDIAC CATHETERIZATION    . COLON SURGERY     hemrrhoids  . CORONARY ANGIOPLASTY WITH STENT PLACEMENT      Family History  Problem Relation Age of Onset  . Diabetes Mother   . Stroke Mother   . Cancer Father        skin, prostate, stomach    New complaints: None today  Social history: Lives with wife  Controlled substance contract: n/a    Review of Systems  Constitutional: Negative for diaphoresis.  Eyes: Negative for pain.  Respiratory: Negative for shortness of breath.   Cardiovascular: Negative for chest pain, palpitations and leg swelling.  Gastrointestinal: Negative for abdominal pain.  Endocrine: Negative for polydipsia.  Skin: Negative for rash.  Neurological: Negative for dizziness, weakness and headaches.  Hematological: Does not bruise/bleed easily.  All other systems reviewed and are negative.      Objective:   Physical Exam Vitals and nursing note reviewed.  Constitutional:      Appearance: Normal appearance. He is well-developed.  HENT:     Head: Normocephalic.     Nose: Nose normal.  Eyes:     Pupils: Pupils are equal, round, and reactive to light.  Neck:     Thyroid: No thyroid mass or thyromegaly.     Vascular:  No carotid bruit or JVD.     Trachea: Phonation normal.  Cardiovascular:     Rate and Rhythm: Normal rate and regular rhythm.  Pulmonary:     Effort: Pulmonary effort is normal. No respiratory distress.     Breath sounds: Normal breath sounds.  Abdominal:     General: Bowel sounds are normal.     Palpations: Abdomen is soft.     Tenderness: There is no abdominal tenderness.  Musculoskeletal:        General: Normal range of motion.     Cervical back: Normal range of motion and neck supple.  Lymphadenopathy:     Cervical: No cervical adenopathy.  Skin:    General: Skin is warm and dry.  Neurological:     Mental Status: He is alert and oriented to person, place, and time.  Psychiatric:         Behavior: Behavior normal.        Thought Content: Thought content normal.        Judgment: Judgment normal.    BP 133/81   Pulse 86   Temp 98 F (36.7 C)   Resp 20   Ht 5' 10"  (1.778 m)   Wt 195 lb (88.5 kg)   SpO2 96%   BMI 27.98 kg/m   Hgba1c 7.6%      Assessment & Plan:  Arthur Torres comes in today with chief complaint of Medical Management of Chronic Issues   Diagnosis and orders addressed:  1. Type 2 diabetes mellitus without complication, without long-term current use of insulin ([facility]) Stricter carb counting - CBC with Differential/Platelet - Bayer DCA Hb A1c Waived - Pneumococcal conjugate vaccine 13-valent - metFORMIN (GLUCOPHAGE) 1000 MG tablet; Take 1 tablet (1,000 mg total) by mouth 2 (two) times daily with a meal.  Dispense: 180 tablet; Refill: 1  2. Hyperlipidemia with target LDL less than 100 Low fat diet - CBC with Differential/Platelet - Lipid panel - rosuvastatin (CRESTOR) 10 MG tablet; Take 1 tablet (10 mg total) by mouth daily.  Dispense: 90 tablet; Refill: 1  3. Essential hypertension, benign Low sodium diet - CBC with Differential/Platelet - CMP14+EGFR  4. Need for pneumococcal vaccination - Pneumococcal conjugate vaccine 13-valent  5. BMI 27.0-27.9,adult Discussed diet and exercise for person with BMI >25 Will recheck weight in 3-6 months  6. H/O prostate cancer - PSA, total and free  7. Acute gastritis without hemorrhage, unspecified gastritis type - pantoprazole (PROTONIX) 40 MG tablet; Take 1 tablet (40 mg total) by mouth daily.  Dispense: 90 tablet; Refill: 1  Ordered cologuard Labs pending Health Maintenance reviewed Diet and exercise encouraged  Follow up plan: 3 months   [name] Done, FNP

## 2019-08-22 LAB — PSA, TOTAL AND FREE
PSA, Free Pct: 20 %
PSA, Free: 0.02 ng/mL
Prostate Specific Ag, Serum: 0.1 ng/mL (ref 0.0–4.0)

## 2019-08-22 LAB — CBC WITH DIFFERENTIAL/PLATELET
Basophils Absolute: 0.1 10*3/uL (ref 0.0–0.2)
Basos: 1 %
EOS (ABSOLUTE): 0.2 10*3/uL (ref 0.0–0.4)
Eos: 3 %
Hematocrit: 46.1 % (ref 37.5–51.0)
Hemoglobin: 15.5 g/dL (ref 13.0–17.7)
Immature Grans (Abs): 0 10*3/uL (ref 0.0–0.1)
Immature Granulocytes: 0 %
Lymphocytes Absolute: 2.5 10*3/uL (ref 0.7–3.1)
Lymphs: 35 %
MCH: 29.5 pg (ref 26.6–33.0)
MCHC: 33.6 g/dL (ref 31.5–35.7)
MCV: 88 fL (ref 79–97)
Monocytes Absolute: 0.5 10*3/uL (ref 0.1–0.9)
Monocytes: 7 %
Neutrophils Absolute: 3.8 10*3/uL (ref 1.4–7.0)
Neutrophils: 54 %
Platelets: 162 10*3/uL (ref 150–450)
RBC: 5.26 x10E6/uL (ref 4.14–5.80)
RDW: 13 % (ref 11.6–15.4)
WBC: 7.1 10*3/uL (ref 3.4–10.8)

## 2019-08-22 LAB — LIPID PANEL
Chol/HDL Ratio: 3.1 ratio (ref 0.0–5.0)
Cholesterol, Total: 130 mg/dL (ref 100–199)
HDL: 42 mg/dL (ref >39)
LDL Chol Calc (NIH): 56 mg/dL (ref 0–99)
Triglycerides: 194 mg/dL — ABNORMAL HIGH (ref 0–149)
VLDL Cholesterol Cal: 32 mg/dL (ref 5–40)

## 2019-08-22 LAB — CMP14+EGFR
ALT: 29 IU/L (ref 0–44)
AST: 20 IU/L (ref 0–40)
Albumin/Globulin Ratio: 1.7 (ref 1.2–2.2)
Albumin: 4.6 g/dL (ref 3.8–4.8)
Alkaline Phosphatase: 71 IU/L (ref 39–117)
BUN/Creatinine Ratio: 16 (ref 10–24)
BUN: 17 mg/dL (ref 8–27)
Bilirubin Total: 0.3 mg/dL (ref 0.0–1.2)
CO2: 21 mmol/L (ref 20–29)
Calcium: 9.5 mg/dL (ref 8.6–10.2)
Chloride: 99 mmol/L (ref 96–106)
Creatinine, Ser: 1.09 mg/dL (ref 0.76–1.27)
GFR calc Af Amer: 81 mL/min/{1.73_m2} (ref >59)
GFR calc non Af Amer: 70 mL/min/{1.73_m2} (ref >59)
Globulin, Total: 2.7 g/dL (ref 1.5–4.5)
Glucose: 156 mg/dL — ABNORMAL HIGH (ref 65–99)
Potassium: 4.2 mmol/L (ref 3.5–5.2)
Sodium: 138 mmol/L (ref 134–144)
Total Protein: 7.3 g/dL (ref 6.0–8.5)

## 2019-09-04 DIAGNOSIS — Z1211 Encounter for screening for malignant neoplasm of colon: Secondary | ICD-10-CM | POA: Diagnosis not present

## 2019-09-04 DIAGNOSIS — Z1212 Encounter for screening for malignant neoplasm of rectum: Secondary | ICD-10-CM | POA: Diagnosis not present

## 2019-09-09 LAB — COLOGUARD: COLOGUARD: NEGATIVE

## 2019-09-12 ENCOUNTER — Ambulatory Visit (INDEPENDENT_AMBULATORY_CARE_PROVIDER_SITE_OTHER): Payer: Medicare HMO

## 2019-09-12 DIAGNOSIS — Z0000 Encounter for general adult medical examination without abnormal findings: Secondary | ICD-10-CM

## 2019-09-12 NOTE — Progress Notes (Signed)
MEDICARE ANNUAL WELLNESS VISIT  09/12/2019  Telephone Visit Disclaimer This Medicare AWV was conducted by telephone due to national recommendations for restrictions regarding the COVID-19 Pandemic (e.g. social distancing).  I verified, using two identifiers, that I am speaking with Arthur Torres or their authorized healthcare agent. I discussed the limitations, risks, security, and privacy concerns of performing an evaluation and management service by telephone and the potential availability of an in-person appointment in the future. The patient expressed understanding and agreed to proceed.   Subjective:  Arthur Torres is a 69 y.o. male patient of Arthur Torres, Arthur Torres who had a Medicare Annual Wellness Visit today via telephone. [name] is Working full time and lives with their spouse. he has 2 children. he reports that he is socially active and does interact with friends/family regularly. he is moderately physically active and enjoys music. He enjoys recording music and singing. He and his wife both have children but no children together. She has a son from a previous marriage and he has 2 children from a previous marriage. 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 His home in one level. He does not participate in any specific exercise routine.   Patient Care Team: Arthur Torres as PCP - General (Nurse Practitioner)  Advanced Directives 09/12/2019  Does Patient Have a Medical Advance Directive? No  Would patient like information on creating a medical advance directive? No - Patient declined    Hospital Utilization Over the Past 12 Months: # of hospitalizations or ER visits: 0 # of surgeries: 0  Review of Systems    Patient reports that his overall health is unchanged compared to last year.  History obtained from chart review  Patient Reported Readings (BP, Pulse, CBG, Weight, etc) none  Pain Assessment Pain :  No/denies pain     Current Medications & Allergies (verified) Allergies as of 09/12/2019      Reactions   Penicillins    Zocor [simvastatin]       Medication List       Accurate as of September 12, 2019  8:45 AM. If you have any questions, ask your nurse or doctor.        aspirin 325 MG EC tablet Take 325 mg by mouth daily.   metFORMIN 1000 MG tablet Commonly known as: GLUCOPHAGE Take 1 tablet (1,000 mg total) by mouth 2 (two) times daily with a meal.   metoprolol succinate 50 MG 24 hr tablet Commonly known as: TOPROL-XL Take 1 tablet (50 mg total) by mouth daily. Take with or immediately following a meal.   pantoprazole 40 MG tablet Commonly known as: PROTONIX Take 1 tablet (40 mg total) by mouth daily.   rosuvastatin 10 MG tablet Commonly known as: Crestor Take 1 tablet (10 mg total) by mouth daily.       History (reviewed): Past Medical History:  Diagnosis Date  . CAD (coronary artery disease)    1999 occluded LAD treated with PCI and stenting, 80% stenosis of the first diagonal feeling angioplasty, 60-70% second stenosis, 50% ostial left main stenosis, 25% right coronary artery stenosis, 50% ostial PDA stenosis.  . Cancer [facility])    prostate (treated with radiation)  . COVID-19   . Diabetes mellitus without complication ([place])   . Hyperlipidemia   . Hypertension   . Joint pain    Past Surgical History:  Procedure Laterality Date  . CARDIAC CATHETERIZATION    .  COLON SURGERY     hemrrhoids  . CORONARY ANGIOPLASTY WITH STENT PLACEMENT     Family History  Problem Relation Age of Onset  . Diabetes Mother   . Stroke Mother   . Cancer Father        skin, prostate, stomach   Social History   Socioeconomic History  . Marital status: Married    Spouse name: Not on file  . Number of children: 2  . Years of education: Not on file  . Highest education level: High school graduate  Occupational History  . Occupation: Autobody work    Fish farm manager: [employer]    Tobacco Use  . Smoking status: Former Smoker    Types: Cigarettes    Quit date: 03/28/1998    Years since quitting: 21.4  . Smokeless tobacco: Never Used  Substance and Sexual Activity  . Alcohol use: No  . Drug use: No  . Sexual activity: Yes  Other Topics Concern  . Not on file  Social History Narrative  . Not on file   Social Determinants of Health   Financial Resource Strain:   . Difficulty of Paying Living Expenses:   Food Insecurity:   . Worried About Charity fundraiser in the Last Year:   . Arboriculturist in the Last Year:   Transportation Needs:   . Film/video editor (Medical):   Marland Kitchen Lack of Transportation (Non-Medical):   Physical Activity:   . Days of Exercise per Week:   . Minutes of Exercise per Session:   Stress:   . Feeling of Stress :   Social Connections:   . Frequency of Communication with Friends and Family:   . Frequency of Social Gatherings with Friends and Family:   . Attends Religious Services:   . Active Member of Clubs or Organizations:   . Attends Archivist Meetings:   Marland Kitchen Marital Status:     Activities of Daily Living In your present state of health, do you have any difficulty performing the following activities: 09/12/2019  Hearing? Y  Comment Ringing in ears but can still hear  Vision? Y  Comment Wears glasses all the time  Difficulty concentrating or making decisions? N  Walking or climbing stairs? N  Dressing or bathing? N  Doing errands, shopping? N  Preparing Food and eating ? N  Using the Toilet? N  In the past six months, have you accidently leaked urine? N  Do you have problems with loss of bowel control? N  Managing your Medications? N  Managing your Finances? N  Housekeeping or managing your Housekeeping? N  Some recent data might be hidden    Patient Education/ Literacy How often do you need to have someone help you when you read instructions, pamphlets, or other written materials from your doctor or  pharmacy?: 1 - Never What is the last grade level you completed in school?: [employer] graduate  Exercise Current Exercise Habits: The patient does not participate in regular exercise at present, Exercise limited by: cardiac condition(s)  Diet Patient reports consuming 2 meals a day and 2 snack(s) a day Patient reports that his primary diet is: Diabetic Patient reports that she does have regular access to food.   Depression Screen PHQ 2/9 Scores 09/12/2019 08/21/2019 05/21/2019 03/26/2019 02/16/2019 11/13/2018 09/11/2018  PHQ - 2 Score 0 0 0 0 0 0 0     Fall Risk Fall Risk  09/12/2019 08/21/2019 05/21/2019 02/16/2019 09/11/2018  Falls in the past  year? 0 0 0 0 0     Objective:  Arthur Torres seemed alert and oriented and he participated appropriately during our telephone visit.  Blood Pressure Weight BMI  BP Readings from Last 3 Encounters:  08/21/19 133/81  05/21/19 127/77  03/26/19 113/64   Wt Readings from Last 3 Encounters:  08/21/19 195 lb (88.5 kg)  05/21/19 192 lb (87.1 kg)  03/26/19 195 lb 9.6 oz (88.7 kg)   BMI Readings from Last 1 Encounters:  08/21/19 27.98 kg/m    *Unable to obtain current vital signs, weight, and BMI due to telephone visit type  Hearing/Vision  . [place] did not seem to have difficulty with hearing/understanding during the telephone conversation . Reports that he has had a formal eye exam by an eye care professional within the past year He has an appt with his eye Dr this coming week. . Reports that he has not had a formal hearing evaluation within the past year *Unable to fully assess hearing and vision during telephone visit type  Cognitive Function: 6CIT Screen 09/12/2019  What Year? 0 points  What month? 0 points  What time? 0 points  Count back from 20 0 points  Months in reverse 0 points  Repeat phrase 0 points  Total Score 0   (Normal:0-7, Significant for Dysfunction: >8)  Normal Cognitive Function Screening: Yes Patient had no  problem with MMSE  Immunization & Health Maintenance Record Immunization History  Administered Date(s) Administered  . Influenza,inj,Quad PF,6+ Mos 04/13/2013, 04/17/2015, 03/27/2018  . Pneumococcal Conjugate-13 08/21/2019  . Pneumococcal Polysaccharide-23 12/11/2013  . Zoster Recombinat (Shingrix) 05/03/2017, 08/08/2017    Health Maintenance  Topic Date Due  . INFLUENZA VACCINE  09/26/2019 (Originally 01/27/2019)  . OPHTHALMOLOGY EXAM  12/27/2019 (Originally 08/18/2018)  . COLONOSCOPY  01/28/2020 (Originally 11/27/2015)  . TETANUS/TDAP  05/20/2020 (Originally 06/29/2016)  . HEMOGLOBIN A1C  02/18/2020  . URINE MICROALBUMIN  05/20/2020  . FOOT EXAM  08/20/2020  . PNA vac Low Risk Adult (2 of 2 - PPSV23) 08/20/2020  . Hepatitis C Screening  Completed       Assessment  This is a routine wellness examination for Arthur Torres.  Health Maintenance: Due or Overdue There are no preventive care reminders to display for this patient.  Arthur Torres does not need a referral for Community Assistance: Care Management:   no Social Work:    no Prescription Assistance:  no Nutrition/Diabetes Education:  no   Plan:  Personalized Goals Goals Addressed            This Visit's Progress   . Exercise 150 min/wk Moderate Activity      . Have 3 meals a day        Personalized Health Maintenance & Screening Recommendations  Td vaccine  Lung Cancer Screening Recommended: no (Low Dose CT Chest recommended if Age 77-80 years, 30 pack-year currently smoking OR have quit w/in past 15 years) Hepatitis C Screening recommended: Done on 07/24/2015 HIV Screening recommended: no  Advanced Directives: Written information was not prepared per patient's request.  Referrals & Orders No orders of the defined types were placed in this encounter.   Follow-up Plan . Follow-up with Arthur Torres as planned . Schedule regular follow up with Arthur Torres, Patient already has appt  scheduled. Need to discuss boostrix vaccine    I have personally reviewed and noted the following in the patient's chart:   . Medical and social history . Use of alcohol, tobacco  or illicit drugs  . Current medications and supplements . Functional ability and status . Nutritional status . Physical activity . Advanced directives . List of other physicians . Hospitalizations, surgeries, and ER visits in previous 12 months . Vitals . Screenings to include cognitive, depression, and falls . Referrals and appointments  In addition, I have reviewed and discussed with Arthur Torres certain preventive protocols, quality metrics, and best practice recommendations. A written personalized care plan for preventive services as well as general preventive health recommendations is available and can be mailed to the patient at his request.      Rolena Infante LPN [id number]

## 2019-09-13 LAB — COLOGUARD: Cologuard: NEGATIVE

## 2019-09-17 DIAGNOSIS — Z0100 Encounter for examination of eyes and vision without abnormal findings: Secondary | ICD-10-CM | POA: Diagnosis not present

## 2019-09-17 DIAGNOSIS — H5213 Myopia, bilateral: Secondary | ICD-10-CM | POA: Diagnosis not present

## 2019-09-17 DIAGNOSIS — E113293 Type 2 diabetes mellitus with mild nonproliferative diabetic retinopathy without macular edema, bilateral: Secondary | ICD-10-CM | POA: Diagnosis not present

## 2019-09-17 DIAGNOSIS — H524 Presbyopia: Secondary | ICD-10-CM | POA: Diagnosis not present

## 2019-09-17 DIAGNOSIS — H25093 Other age-related incipient cataract, bilateral: Secondary | ICD-10-CM | POA: Diagnosis not present

## 2019-09-17 LAB — HM DIABETES EYE EXAM

## 2019-10-17 ENCOUNTER — Other Ambulatory Visit: Payer: Self-pay | Admitting: Nurse Practitioner

## 2019-10-17 DIAGNOSIS — I10 Essential (primary) hypertension: Secondary | ICD-10-CM

## 2019-11-22 ENCOUNTER — Encounter: Payer: Self-pay | Admitting: Nurse Practitioner

## 2019-11-22 ENCOUNTER — Ambulatory Visit (INDEPENDENT_AMBULATORY_CARE_PROVIDER_SITE_OTHER): Payer: Medicare HMO | Admitting: Nurse Practitioner

## 2019-11-22 ENCOUNTER — Other Ambulatory Visit: Payer: Self-pay

## 2019-11-22 VITALS — BP 125/75 | HR 66 | Temp 97.8°F | Ht 70.0 in | Wt 193.0 lb

## 2019-11-22 DIAGNOSIS — Z6827 Body mass index (BMI) 27.0-27.9, adult: Secondary | ICD-10-CM | POA: Diagnosis not present

## 2019-11-22 DIAGNOSIS — E119 Type 2 diabetes mellitus without complications: Secondary | ICD-10-CM

## 2019-11-22 DIAGNOSIS — K2900 Acute gastritis without bleeding: Secondary | ICD-10-CM

## 2019-11-22 DIAGNOSIS — E785 Hyperlipidemia, unspecified: Secondary | ICD-10-CM

## 2019-11-22 DIAGNOSIS — I10 Essential (primary) hypertension: Secondary | ICD-10-CM

## 2019-11-22 LAB — BAYER DCA HB A1C WAIVED: HB A1C (BAYER DCA - WAIVED): 8.3 % — ABNORMAL HIGH (ref <7.0)

## 2019-11-22 MED ORDER — PANTOPRAZOLE SODIUM 40 MG PO TBEC: 40.0000 mg | DELAYED_RELEASE_TABLET | Freq: Every day | ORAL | 1 refills | Status: DC

## 2019-11-22 MED ORDER — METFORMIN HCL 1000 MG PO TABS: 1000.0000 mg | ORAL_TABLET | Freq: Two times a day (BID) | ORAL | 1 refills | Status: DC

## 2019-11-22 MED ORDER — METOPROLOL SUCCINATE ER 50 MG PO TB24: 50.0000 mg | ORAL_TABLET | Freq: Every day | ORAL | 0 refills | Status: DC

## 2019-11-22 MED ORDER — ROSUVASTATIN CALCIUM 10 MG PO TABS: 10.0000 mg | ORAL_TABLET | Freq: Every day | ORAL | 1 refills | Status: DC

## 2019-11-22 NOTE — Progress Notes (Signed)
Subjective:    Patient ID: Arthur Torres, male    DOB: May 12, 1951, 69 y.o.   MRN: [id number]  HPI  Chief Complaint: No chief complaint on file.    HPI:  1. Essential hypertension, benign No c/o chest pain, sob or headache. Does not check blood pressure at home. BP Readings from Last 3 Encounters:  08/21/19 133/81  05/21/19 127/77  03/26/19 113/64     2. Hyperlipidemia with target LDL less than 100 Does not exercise and does not really wtach diet very closely Lab Results  Component Value Date   CHOL 130 08/21/2019   HDL 42 08/21/2019   LDLCALC 56 08/21/2019   TRIG 194 (H) 08/21/2019   CHOLHDL 3.1 08/21/2019     3. Type 2 diabetes mellitus without complication, without long-term current use of insulin ([place]) He does not check blood sugars at home. He ha cut back on the amount of bread he eats. Lab Results  Component Value Date   HGBA1C 7.6 (H) 08/21/2019      4. BMI 27.0-27.9,adult No recent weight changes Wt Readings from Last 3 Encounters:  11/22/19 193 lb (87.5 kg)  08/21/19 195 lb (88.5 kg)  05/21/19 192 lb (87.1 kg)   BMI Readings from Last 3 Encounters:  11/22/19 27.69 kg/m  08/21/19 27.98 kg/m  05/21/19 27.55 kg/m      Outpatient Encounter Medications as of 11/22/2019  Medication Sig  . aspirin 325 MG EC tablet Take 325 mg by mouth daily.  . metFORMIN (GLUCOPHAGE) 1000 MG tablet Take 1 tablet (1,000 mg total) by mouth 2 (two) times daily with a meal.  . metoprolol succinate (TOPROL-XL) 50 MG 24 hr tablet TAKE 1 TABLET (50 MG TOTAL) BY MOUTH DAILY. TAKE WITH OR IMMEDIATELY FOLLOWING A MEAL.  . pantoprazole (PROTONIX) 40 MG tablet Take 1 tablet (40 mg total) by mouth daily.  . rosuvastatin (CRESTOR) 10 MG tablet Take 1 tablet (10 mg total) by mouth daily.     Past Surgical History:  Procedure Laterality Date  . CARDIAC CATHETERIZATION    . COLON SURGERY     hemrrhoids  . CORONARY ANGIOPLASTY WITH STENT PLACEMENT      Family History    Problem Relation Age of Onset  . Diabetes Mother   . Stroke Mother   . Cancer Father        skin, prostate, stomach    New complaints: None today  Social history: lives with wife  Controlled substance contract: n/a    Review of Systems  Constitutional: Negative for diaphoresis.  Eyes: Negative for pain.  Respiratory: Negative for shortness of breath.   Cardiovascular: Negative for chest pain, palpitations and leg swelling.  Gastrointestinal: Negative for abdominal pain.  Endocrine: Negative for polydipsia.  Skin: Negative for rash.  Neurological: Negative for dizziness, weakness and headaches.  Hematological: Does not bruise/bleed easily.  All other systems reviewed and are negative.      Objective:   Physical Exam Vitals and nursing note reviewed.  Constitutional:      Appearance: Normal appearance. He is well-developed.  HENT:     Head: Normocephalic.     Nose: Nose normal.  Eyes:     Pupils: Pupils are equal, round, and reactive to light.  Neck:     Thyroid: No thyroid mass or thyromegaly.     Vascular: No carotid bruit or JVD.     Trachea: Phonation normal.  Cardiovascular:     Rate and Rhythm: Normal rate and regular rhythm.  Pulmonary:     Effort: Pulmonary effort is normal. No respiratory distress.     Breath sounds: Normal breath sounds.  Abdominal:     General: Bowel sounds are normal.     Palpations: Abdomen is soft.     Tenderness: There is no abdominal tenderness.  Musculoskeletal:        General: Normal range of motion.     Cervical back: Normal range of motion and neck supple.  Lymphadenopathy:     Cervical: No cervical adenopathy.  Skin:    General: Skin is warm and dry.  Neurological:     Mental Status: He is alert and oriented to person, place, and time.  Psychiatric:        Behavior: Behavior normal.        Thought Content: Thought content normal.        Judgment: Judgment normal.    Blood pressure 125/75, pulse 66, temperature  97.8 F (36.6 C), temperature source Oral, height 5' 10"  (1.778 m), weight 193 lb (87.5 kg).  hgba1c 8.3       Assessment & Plan:  Arthur Torres comes in today with chief complaint of Medical Management of Chronic Issues   Diagnosis and orders addressed:  1. Essential hypertension, benign Low sodium diet - metoprolol succinate (TOPROL-XL) 50 MG 24 hr tablet; Take 1 tablet (50 mg total) by mouth daily. Take with or immediately following a meal.  Dispense: 90 tablet; Refill: 0 - CBC with Differential/Platelet - CMP14+EGFR  2. Hyperlipidemia with target LDL less than 100 Low fat diet - rosuvastatin (CRESTOR) 10 MG tablet; Take 1 tablet (10 mg total) by mouth daily.  Dispense: 90 tablet; Refill: 1 - Lipid panel  3. Type 2 diabetes mellitus without complication, without long-term current use of insulin (Arthur Torres) Patient refused more medication. Wants to try diet for 1 month - metFORMIN (GLUCOPHAGE) 1000 MG tablet; Take 1 tablet (1,000 mg total) by mouth 2 (two) times daily with a meal.  Dispense: 180 tablet; Refill: 1 - Bayer DCA Hb A1c Waived  4. BMI 27.0-27.9,adult Discussed diet and exercise for person with BMI >25 Will recheck weight in 3-6 months   5. Acute gastritis without hemorrhage, unspecified gastritis type Avoid spicy foods Do not eat 2 hours prior to bedtime - pantoprazole (PROTONIX) 40 MG tablet; Take 1 tablet (40 mg total) by mouth daily.  Dispense: 90 tablet; Refill: 1   Labs pending Health Maintenance reviewed Diet and exercise encouraged  Follow up plan: 1 month   [place], FNP

## 2019-11-23 LAB — CBC WITH DIFFERENTIAL/PLATELET
Basophils Absolute: 0.1 10*3/uL (ref 0.0–0.2)
Basos: 1 %
EOS (ABSOLUTE): 0.4 10*3/uL (ref 0.0–0.4)
Eos: 5 %
Hematocrit: 46 % (ref 37.5–51.0)
Hemoglobin: 15.3 g/dL (ref 13.0–17.7)
Immature Grans (Abs): 0 10*3/uL (ref 0.0–0.1)
Immature Granulocytes: 0 %
Lymphocytes Absolute: 2.5 10*3/uL (ref 0.7–3.1)
Lymphs: 34 %
MCH: 28.7 pg (ref 26.6–33.0)
MCHC: 33.3 g/dL (ref 31.5–35.7)
MCV: 86 fL (ref 79–97)
Monocytes Absolute: 0.5 10*3/uL (ref 0.1–0.9)
Monocytes: 6 %
Neutrophils Absolute: 4 10*3/uL (ref 1.4–7.0)
Neutrophils: 54 %
Platelets: 187 10*3/uL (ref 150–450)
RBC: 5.33 x10E6/uL (ref 4.14–5.80)
RDW: 12.9 % (ref 11.6–15.4)
WBC: 7.4 10*3/uL (ref 3.4–10.8)

## 2019-11-23 LAB — CMP14+EGFR
ALT: 24 IU/L (ref 0–44)
AST: 19 IU/L (ref 0–40)
Albumin/Globulin Ratio: 1.7 (ref 1.2–2.2)
Albumin: 4.5 g/dL (ref 3.8–4.8)
Alkaline Phosphatase: 80 IU/L (ref 48–121)
BUN/Creatinine Ratio: 20 (ref 10–24)
BUN: 26 mg/dL (ref 8–27)
Bilirubin Total: 0.3 mg/dL (ref 0.0–1.2)
CO2: 22 mmol/L (ref 20–29)
Calcium: 10 mg/dL (ref 8.6–10.2)
Chloride: 103 mmol/L (ref 96–106)
Creatinine, Ser: 1.28 mg/dL — ABNORMAL HIGH (ref 0.76–1.27)
GFR calc Af Amer: 67 mL/min/{1.73_m2} (ref >59)
GFR calc non Af Amer: 58 mL/min/{1.73_m2} — ABNORMAL LOW (ref >59)
Globulin, Total: 2.7 g/dL (ref 1.5–4.5)
Glucose: 168 mg/dL — ABNORMAL HIGH (ref 65–99)
Potassium: 4.3 mmol/L (ref 3.5–5.2)
Sodium: 139 mmol/L (ref 134–144)
Total Protein: 7.2 g/dL (ref 6.0–8.5)

## 2019-11-23 LAB — LIPID PANEL
Chol/HDL Ratio: 3 ratio (ref 0.0–5.0)
Cholesterol, Total: 114 mg/dL (ref 100–199)
HDL: 38 mg/dL — ABNORMAL LOW (ref >39)
LDL Chol Calc (NIH): 48 mg/dL (ref 0–99)
Triglycerides: 166 mg/dL — ABNORMAL HIGH (ref 0–149)
VLDL Cholesterol Cal: 28 mg/dL (ref 5–40)

## 2019-12-15 DIAGNOSIS — E785 Hyperlipidemia, unspecified: Secondary | ICD-10-CM | POA: Diagnosis not present

## 2019-12-15 DIAGNOSIS — Z008 Encounter for other general examination: Secondary | ICD-10-CM | POA: Diagnosis not present

## 2019-12-15 DIAGNOSIS — Z7722 Contact with and (suspected) exposure to environmental tobacco smoke (acute) (chronic): Secondary | ICD-10-CM | POA: Diagnosis not present

## 2019-12-15 DIAGNOSIS — I2510 Atherosclerotic heart disease of native coronary artery without angina pectoris: Secondary | ICD-10-CM | POA: Diagnosis not present

## 2019-12-15 DIAGNOSIS — K219 Gastro-esophageal reflux disease without esophagitis: Secondary | ICD-10-CM | POA: Diagnosis not present

## 2019-12-15 DIAGNOSIS — I10 Essential (primary) hypertension: Secondary | ICD-10-CM | POA: Diagnosis not present

## 2019-12-15 DIAGNOSIS — Z7982 Long term (current) use of aspirin: Secondary | ICD-10-CM | POA: Diagnosis not present

## 2019-12-15 DIAGNOSIS — M1990 Unspecified osteoarthritis, unspecified site: Secondary | ICD-10-CM | POA: Diagnosis not present

## 2019-12-15 DIAGNOSIS — N529 Male erectile dysfunction, unspecified: Secondary | ICD-10-CM | POA: Diagnosis not present

## 2019-12-15 DIAGNOSIS — I252 Old myocardial infarction: Secondary | ICD-10-CM | POA: Diagnosis not present

## 2019-12-15 DIAGNOSIS — E119 Type 2 diabetes mellitus without complications: Secondary | ICD-10-CM | POA: Diagnosis not present

## 2019-12-24 ENCOUNTER — Encounter: Payer: Self-pay | Admitting: Nurse Practitioner

## 2019-12-24 ENCOUNTER — Ambulatory Visit (INDEPENDENT_AMBULATORY_CARE_PROVIDER_SITE_OTHER): Payer: Medicare HMO | Admitting: Nurse Practitioner

## 2019-12-24 ENCOUNTER — Other Ambulatory Visit: Payer: Self-pay

## 2019-12-24 VITALS — BP 119/69 | HR 60 | Temp 98.3°F | Resp 20 | Ht 70.0 in | Wt 190.0 lb

## 2019-12-24 DIAGNOSIS — E119 Type 2 diabetes mellitus without complications: Secondary | ICD-10-CM

## 2019-12-24 LAB — BAYER DCA HB A1C WAIVED: HB A1C (BAYER DCA - WAIVED): 7.8 % — ABNORMAL HIGH (ref <7.0)

## 2019-12-24 NOTE — Patient Instructions (Signed)
Carbohydrate Counting for Diabetes Mellitus, Adult  Carbohydrate counting is a method of keeping track of how many carbohydrates you eat. Eating carbohydrates naturally increases the amount of sugar (glucose) in the blood. Counting how many carbohydrates you eat helps keep your blood glucose within normal limits, which helps you manage your diabetes (diabetes mellitus). It is important to know how many carbohydrates you can safely have in each meal. This is different for every person. A diet and nutrition specialist (registered dietitian) can help you make a meal plan and calculate how many carbohydrates you should have at each meal and snack. Carbohydrates are found in the following foods:  Grains, such as breads and cereals.  Dried beans and soy products.  Starchy vegetables, such as potatoes, peas, and corn.  Fruit and fruit juices.  Milk and yogurt.  Sweets and snack foods, such as cake, cookies, candy, chips, and soft drinks. How do I count carbohydrates? There are two ways to count carbohydrates in food. You can use either of the methods or a combination of both. Reading "Nutrition Facts" on packaged food The "Nutrition Facts" list is included on the labels of almost all packaged foods and beverages in the [place] It includes:  The serving size.  Information about nutrients in each serving, including the grams (g) of carbohydrate per serving. To use the "Nutrition Facts":  Decide how many servings you will have.  Multiply the number of servings by the number of carbohydrates per serving.  The resulting number is the total amount of carbohydrates that you will be having. Learning standard serving sizes of other foods When you eat carbohydrate foods that are not packaged or do not include "Nutrition Facts" on the label, you need to measure the servings in order to count the amount of carbohydrates:  Measure the foods that you will eat with a food scale or measuring cup, if  needed.  Decide how many standard-size servings you will eat.  Multiply the number of servings by 15. Most carbohydrate-rich foods have about 15 g of carbohydrates per serving. ? For example, if you eat 8 oz (170 g) of strawberries, you will have eaten 2 servings and 30 g of carbohydrates (2 servings x 15 g = 30 g).  For foods that have more than one food mixed, such as soups and casseroles, you must count the carbohydrates in each food that is included. The following list contains standard serving sizes of common carbohydrate-rich foods. Each of these servings has about 15 g of carbohydrates:   hamburger bun or  English muffin.   oz (15 mL) syrup.   oz (14 g) jelly.  1 slice of bread.  1 six-inch tortilla.  3 oz (85 g) cooked rice or pasta.  4 oz (113 g) cooked dried beans.  4 oz (113 g) starchy vegetable, such as peas, corn, or potatoes.  4 oz (113 g) hot cereal.  4 oz (113 g) mashed potatoes or  of a large baked potato.  4 oz (113 g) canned or frozen fruit.  4 oz (120 mL) fruit juice.  4-6 crackers.  6 chicken nuggets.  6 oz (170 g) unsweetened dry cereal.  6 oz (170 g) plain fat-free yogurt or yogurt sweetened with artificial sweeteners.  8 oz (240 mL) milk.  8 oz (170 g) fresh fruit or one small piece of fruit.  24 oz (680 g) popped popcorn. Example of carbohydrate counting Sample meal  3 oz (85 g) chicken breast.  6 oz (170 g)   brown rice.  4 oz (113 g) corn.  8 oz (240 mL) milk.  8 oz (170 g) strawberries with sugar-free whipped topping. Carbohydrate calculation 1. Identify the foods that contain carbohydrates: ? Rice. ? Corn. ? Milk. ? Strawberries. 2. Calculate how many servings you have of each food: ? 2 servings rice. ? 1 serving corn. ? 1 serving milk. ? 1 serving strawberries. 3. Multiply each number of servings by 15 g: ? 2 servings rice x 15 g = 30 g. ? 1 serving corn x 15 g = 15 g. ? 1 serving milk x 15 g = 15 g. ? 1  serving strawberries x 15 g = 15 g. 4. Add together all of the amounts to find the total grams of carbohydrates eaten: ? 30 g + 15 g + 15 g + 15 g = 75 g of carbohydrates total. Summary  Carbohydrate counting is a method of keeping track of how many carbohydrates you eat.  Eating carbohydrates naturally increases the amount of sugar (glucose) in the blood.  Counting how many carbohydrates you eat helps keep your blood glucose within normal limits, which helps you manage your diabetes.  A diet and nutrition specialist (registered dietitian) can help you make a meal plan and calculate how many carbohydrates you should have at each meal and snack. This information is not intended to replace advice given to you by your health care provider. Make sure you discuss any questions you have with your health care provider. Document Revised: 01/06/2017 Document Reviewed: 11/26/2015 Elsevier Patient Education  2020 Elsevier Inc.  

## 2019-12-24 NOTE — Progress Notes (Signed)
   Subjective:    Patient ID: Arthur Torres, male    DOB: Mar 04, 1951, 69 y.o.   MRN: [id number]   Chief Complaint: Diabetes   HPI Patient was seen on 11/22/19. At that time his blood sugars were running high. His hgba1c was 8.3%. we discussed diet . Patient refused to change medictaion. Wanted to try to bring it down with diet and exercise. He still has not been checking his blood sugars. Says he has been watching his diet.     Review of Systems  Constitutional: Negative for diaphoresis.  Eyes: Negative for pain.  Respiratory: Negative for shortness of breath.   Cardiovascular: Negative for chest pain, palpitations and leg swelling.  Gastrointestinal: Negative for abdominal pain.  Endocrine: Negative for polydipsia.  Skin: Negative for rash.  Neurological: Negative for dizziness, weakness and headaches.  Hematological: Does not bruise/bleed easily.  All other systems reviewed and are negative.      Objective:   Physical Exam Vitals and nursing note reviewed.  Constitutional:      Appearance: Normal appearance.  Cardiovascular:     Rate and Rhythm: Normal rate and regular rhythm.     Heart sounds: Normal heart sounds.  Pulmonary:     Breath sounds: Normal breath sounds.  Skin:    General: Skin is warm.  Neurological:     General: No focal deficit present.     Mental Status: He is alert.     BP 119/69   Pulse 60   Temp 98.3 F (36.8 C) (Temporal)   Resp 20   Ht 5\' 10"  (1.778 m)   Wt 190 lb (86.2 kg)   SpO2 97%   BMI 27.26 kg/m   hgba1c 7.8%      Assessment & Plan:  Arthur Torres in today with chief complaint of Diabetes   1. Type 2 diabetes mellitus without complication, without long-term current use of insulin ([facility]) Low carb diet Exercise Follow up in 2 months - Bayer DCA Hb A1c Waived    The above assessment and management plan was discussed with the patient. The patient verbalized understanding of and has agreed to the management plan.  Patient is aware to call the clinic if symptoms persist or worsen. Patient is aware when to return to the clinic for a follow-up visit. Patient educated on when it is appropriate to go to the emergency department.   [name] Done, FNP

## 2020-02-13 ENCOUNTER — Other Ambulatory Visit: Payer: Self-pay | Admitting: Nurse Practitioner

## 2020-02-13 DIAGNOSIS — I10 Essential (primary) hypertension: Secondary | ICD-10-CM

## 2020-02-26 ENCOUNTER — Ambulatory Visit (INDEPENDENT_AMBULATORY_CARE_PROVIDER_SITE_OTHER): Payer: Medicare HMO | Admitting: Nurse Practitioner

## 2020-02-26 ENCOUNTER — Encounter: Payer: Self-pay | Admitting: Nurse Practitioner

## 2020-02-26 ENCOUNTER — Other Ambulatory Visit: Payer: Self-pay

## 2020-02-26 VITALS — BP 118/73 | HR 66 | Temp 97.6°F | Resp 20 | Ht 70.0 in | Wt 190.0 lb

## 2020-02-26 DIAGNOSIS — E785 Hyperlipidemia, unspecified: Secondary | ICD-10-CM

## 2020-02-26 DIAGNOSIS — K2900 Acute gastritis without bleeding: Secondary | ICD-10-CM

## 2020-02-26 DIAGNOSIS — E119 Type 2 diabetes mellitus without complications: Secondary | ICD-10-CM

## 2020-02-26 DIAGNOSIS — Z6827 Body mass index (BMI) 27.0-27.9, adult: Secondary | ICD-10-CM

## 2020-02-26 DIAGNOSIS — I10 Essential (primary) hypertension: Secondary | ICD-10-CM | POA: Diagnosis not present

## 2020-02-26 LAB — BAYER DCA HB A1C WAIVED: HB A1C (BAYER DCA - WAIVED): 7.3 % — ABNORMAL HIGH (ref <7.0)

## 2020-02-26 MED ORDER — PANTOPRAZOLE SODIUM 40 MG PO TBEC: 40.0000 mg | DELAYED_RELEASE_TABLET | Freq: Every day | ORAL | 1 refills | Status: DC

## 2020-02-26 MED ORDER — METFORMIN HCL 1000 MG PO TABS: 1000.0000 mg | ORAL_TABLET | Freq: Two times a day (BID) | ORAL | 1 refills | Status: DC

## 2020-02-26 MED ORDER — ROSUVASTATIN CALCIUM 10 MG PO TABS: 10.0000 mg | ORAL_TABLET | Freq: Every day | ORAL | 1 refills | Status: DC

## 2020-02-26 NOTE — Progress Notes (Signed)
Subjective:    Patient ID: Arthur Torres, male    DOB: 01/24/1951, 69 y.o.   MRN: [id number]   Chief Complaint: No chief complaint on file.    HPI:  1. Type 2 diabetes mellitus without complication, without long-term current use of insulin ([facility]) Lab Results  Component Value Date   HGBA1C 7.8 (H) 12/24/2019   Does not check blood glucose levels at home, does not like getting stuck. Watches diet and attempts to manage HA1C with diet control. Takes medication as prescribed. Today's HA1C is 7.3.   2. Essential hypertension, benign BP Readings from Last 3 Encounters:  02/26/20 118/73  12/24/19 119/69  11/22/19 125/75   Does not check blood pressure at home. Takes medication as prescribed. Eats a heart healthy diet and avoids salt. Denies chest pain, SOB, swelling, or headaches.   3. Hyperlipidemia with target LDL less than 100 Lab Results  Component Value Date   CHOL 114 11/22/2019   HDL 38 (L) 11/22/2019   LDLCALC 48 11/22/2019   TRIG 166 (H) 11/22/2019   CHOLHDL 3.0 11/22/2019  Takes medication as prescribed.   4. BMI 27.0-27.9,adult Wt Readings from Last 3 Encounters:  02/26/20 190 lb (86.2 kg)  12/24/19 190 lb (86.2 kg)  11/22/19 193 lb (87.5 kg)   BMI Readings from Last 3 Encounters:  02/26/20 27.26 kg/m  12/24/19 27.26 kg/m  11/22/19 27.69 kg/m   Plays with grandchildren, works at [employer] Denies any formal exercise.     Outpatient Encounter Medications as of 02/26/2020  Medication Sig  . aspirin 325 MG EC tablet Take 325 mg by mouth daily.  . metFORMIN (GLUCOPHAGE) 1000 MG tablet Take 1 tablet (1,000 mg total) by mouth 2 (two) times daily with a meal.  . metoprolol succinate (TOPROL-XL) 50 MG 24 hr tablet TAKE 1 TABLET (50 MG TOTAL) BY MOUTH DAILY. TAKE WITH OR IMMEDIATELY FOLLOWING A MEAL.  . pantoprazole (PROTONIX) 40 MG tablet Take 1 tablet (40 mg total) by mouth daily.  . rosuvastatin (CRESTOR) 10 MG tablet Take 1 tablet (10 mg total) by mouth  daily.   No facility-administered encounter medications on file as of 02/26/2020.    Past Surgical History:  Procedure Laterality Date  . CARDIAC CATHETERIZATION    . COLON SURGERY     hemrrhoids  . CORONARY ANGIOPLASTY WITH STENT PLACEMENT      Family History  Problem Relation Age of Onset  . Diabetes Mother   . Stroke Mother   . Cancer Father        skin, prostate, stomach    New complaints: No new complaints today.   Social history: Lives at home with wife, plays with grandchildren for fun. Goes out to eat or travel to [place] to visit daughters.   Controlled substance contract: n/a    Review of Systems  Constitutional: Negative.   HENT: Negative.   Eyes: Negative.   Respiratory: Negative.   Cardiovascular: Negative.   Gastrointestinal: Negative.   Endocrine: Negative.   Genitourinary: Negative.   Musculoskeletal: Negative.   Skin: Negative.   Allergic/Immunologic: Negative.   Neurological: Negative.   Hematological: Negative.   Psychiatric/Behavioral: Negative.   All other systems reviewed and are negative.     Objective:   Physical Exam Vitals and nursing note reviewed.  Constitutional:      Appearance: Normal appearance.  HENT:     Head: Normocephalic and atraumatic.     Right Ear: Tympanic membrane, ear canal and external ear normal.  Left Ear: Tympanic membrane, ear canal and external ear normal.     Nose: Nose normal.     Mouth/Throat:     Mouth: Mucous membranes are moist.     Pharynx: Oropharynx is clear.  Eyes:     Extraocular Movements: Extraocular movements intact.     Conjunctiva/sclera: Conjunctivae normal.     Pupils: Pupils are equal, round, and reactive to light.  Cardiovascular:     Rate and Rhythm: Normal rate and regular rhythm.     Pulses: Normal pulses.     Heart sounds: Normal heart sounds.  Pulmonary:     Effort: Pulmonary effort is normal.     Breath sounds: Normal breath sounds.  Abdominal:     General: Abdomen is  flat. Bowel sounds are normal.     Palpations: Abdomen is soft.  Musculoskeletal:        General: Normal range of motion.     Cervical back: Normal range of motion.  Skin:    General: Skin is warm and dry.     Capillary Refill: Capillary refill takes less than 2 seconds.  Neurological:     General: No focal deficit present.     Mental Status: He is alert.  Psychiatric:        Mood and Affect: Mood normal.        Behavior: Behavior normal.        Thought Content: Thought content normal.        Judgment: Judgment normal.     BP 118/73   Pulse 66   Temp 97.6 F (36.4 C) (Temporal)   Resp 20   Ht _0  (1.778 m)   Wt 190 lb (86.2 kg)   SpO2 97%   BMI 27.26 kg/m   hgba1c 7.3%    Assessment & Plan:   Arthur Torres comes in today with chief complaint of No chief complaint on file.   Diagnosis and orders addressed:  1. Type 2 diabetes mellitus without complication, without long-term current use of insulin ([facility]) Check blood glucose regularly and take medication as prescribed. Avoid foods that are high in carbs and sugar.    - Bayer DCA Hb A1c Waived  2. Essential hypertension, benign Check blood pressure regularly and take medication as prescribed.   - CBC with Differential/Platelet - CMP14+EGFR  3. Hyperlipidemia with target LDL less than 100 Take medication as prescribed. Avoid foods that are fried or high in fat. Avoid fast food.   - Lipid panel  4. BMI 27.0-27.9,adult Stay active. Walking is a great cardiovascular exercise that helps lower blood pressure, cholesterol levels, and blood glucose levels. Eat a heart healthy diet and avoid foods that are high in salt.   Labs pending Health Maintenance reviewed Diet and exercise encouraged  Follow up plan: Follow up in 3 months.   [name] Done, FNP

## 2020-02-26 NOTE — Patient Instructions (Signed)

## 2020-02-27 LAB — CBC WITH DIFFERENTIAL/PLATELET
Basophils Absolute: 0.1 10*3/uL (ref 0.0–0.2)
Basos: 1 %
EOS (ABSOLUTE): 0.2 10*3/uL (ref 0.0–0.4)
Eos: 3 %
Hematocrit: 41 % (ref 37.5–51.0)
Hemoglobin: 14 g/dL (ref 13.0–17.7)
Immature Grans (Abs): 0 10*3/uL (ref 0.0–0.1)
Immature Granulocytes: 0 %
Lymphocytes Absolute: 2.1 10*3/uL (ref 0.7–3.1)
Lymphs: 32 %
MCH: 30.1 pg (ref 26.6–33.0)
MCHC: 34.1 g/dL (ref 31.5–35.7)
MCV: 88 fL (ref 79–97)
Monocytes Absolute: 0.4 10*3/uL (ref 0.1–0.9)
Monocytes: 6 %
Neutrophils Absolute: 3.8 10*3/uL (ref 1.4–7.0)
Neutrophils: 58 %
Platelets: 145 10*3/uL — ABNORMAL LOW (ref 150–450)
RBC: 4.65 x10E6/uL (ref 4.14–5.80)
RDW: 13.1 % (ref 11.6–15.4)
WBC: 6.6 10*3/uL (ref 3.4–10.8)

## 2020-02-27 LAB — CMP14+EGFR
ALT: 21 IU/L (ref 0–44)
AST: 15 IU/L (ref 0–40)
Albumin/Globulin Ratio: 1.9 (ref 1.2–2.2)
Albumin: 4.4 g/dL (ref 3.8–4.8)
Alkaline Phosphatase: 70 IU/L (ref 48–121)
BUN/Creatinine Ratio: 19 (ref 10–24)
BUN: 18 mg/dL (ref 8–27)
Bilirubin Total: 0.4 mg/dL (ref 0.0–1.2)
CO2: 22 mmol/L (ref 20–29)
Calcium: 9.6 mg/dL (ref 8.6–10.2)
Chloride: 101 mmol/L (ref 96–106)
Creatinine, Ser: 0.96 mg/dL (ref 0.76–1.27)
GFR calc Af Amer: 95 mL/min/{1.73_m2} (ref >59)
GFR calc non Af Amer: 82 mL/min/{1.73_m2} (ref >59)
Globulin, Total: 2.3 g/dL (ref 1.5–4.5)
Glucose: 159 mg/dL — ABNORMAL HIGH (ref 65–99)
Potassium: 4.2 mmol/L (ref 3.5–5.2)
Sodium: 138 mmol/L (ref 134–144)
Total Protein: 6.7 g/dL (ref 6.0–8.5)

## 2020-02-27 LAB — LIPID PANEL
Chol/HDL Ratio: 2.7 ratio (ref 0.0–5.0)
Cholesterol, Total: 109 mg/dL (ref 100–199)
HDL: 40 mg/dL (ref >39)
LDL Chol Calc (NIH): 39 mg/dL (ref 0–99)
Triglycerides: 184 mg/dL — ABNORMAL HIGH (ref 0–149)
VLDL Cholesterol Cal: 30 mg/dL (ref 5–40)

## 2020-05-06 DIAGNOSIS — R69 Illness, unspecified: Secondary | ICD-10-CM | POA: Diagnosis not present

## 2020-05-27 ENCOUNTER — Other Ambulatory Visit: Payer: Self-pay

## 2020-05-27 ENCOUNTER — Ambulatory Visit (INDEPENDENT_AMBULATORY_CARE_PROVIDER_SITE_OTHER): Payer: Medicare HMO | Admitting: Nurse Practitioner

## 2020-05-27 ENCOUNTER — Encounter: Payer: Self-pay | Admitting: Nurse Practitioner

## 2020-05-27 VITALS — BP 123/73 | HR 67 | Temp 97.5°F | Ht 70.0 in | Wt 193.0 lb

## 2020-05-27 DIAGNOSIS — I10 Essential (primary) hypertension: Secondary | ICD-10-CM | POA: Diagnosis not present

## 2020-05-27 DIAGNOSIS — K2900 Acute gastritis without bleeding: Secondary | ICD-10-CM

## 2020-05-27 DIAGNOSIS — E119 Type 2 diabetes mellitus without complications: Secondary | ICD-10-CM | POA: Diagnosis not present

## 2020-05-27 DIAGNOSIS — E785 Hyperlipidemia, unspecified: Secondary | ICD-10-CM

## 2020-05-27 DIAGNOSIS — Z6827 Body mass index (BMI) 27.0-27.9, adult: Secondary | ICD-10-CM | POA: Diagnosis not present

## 2020-05-27 LAB — BAYER DCA HB A1C WAIVED: HB A1C (BAYER DCA - WAIVED): 8 % — ABNORMAL HIGH (ref <7.0)

## 2020-05-27 MED ORDER — ROSUVASTATIN CALCIUM 10 MG PO TABS: 10.0000 mg | ORAL_TABLET | Freq: Every day | ORAL | 1 refills | Status: DC

## 2020-05-27 MED ORDER — METFORMIN HCL 1000 MG PO TABS: 1000.0000 mg | ORAL_TABLET | Freq: Two times a day (BID) | ORAL | 1 refills | Status: DC

## 2020-05-27 MED ORDER — PANTOPRAZOLE SODIUM 40 MG PO TBEC: 40.0000 mg | DELAYED_RELEASE_TABLET | Freq: Every day | ORAL | 1 refills | Status: DC

## 2020-05-27 MED ORDER — METOPROLOL SUCCINATE ER 50 MG PO TB24: 50.0000 mg | ORAL_TABLET | Freq: Every day | ORAL | 1 refills | Status: DC

## 2020-05-27 NOTE — Progress Notes (Signed)
Subjective:    Patient ID: Arthur Torres, male    DOB: 1951/05/31, 69 y.o.   MRN: [id number]   Chief Complaint: No chief complaint on file.    HPI:  1. Type 2 diabetes mellitus without complication, without long-term current use of insulin ([facility]) Does not check blood sugars at home. Has not really been watching diet. Denies blurry vision, numbness/tingling in feet. Taking metformin bid. Lab Results  Component Value Date   HGBA1C 7.3 (H) 02/26/2020    2. Essential hypertension, benign Does not check BP at home. Is taking 1/2 tablet of metoprolol daily. Denies heart palpitations, racing heartbeat, SOB, chest pain, dizziness. Does not eat much salt. BP Readings from Last 3 Encounters:  02/26/20 118/73  12/24/19 119/69  11/22/19 125/75    3. Hyperlipidemia with target LDL less than 100 Takes crestor. Does try to limit fried and fatty foods. Does no devoted exercise but does keep active at work and at home. Lab Results  Component Value Date   CHOL 109 02/26/2020   HDL 40 02/26/2020   LDLCALC 39 02/26/2020   TRIG 184 (H) 02/26/2020   CHOLHDL 2.7 02/26/2020    4. BMI 27.0-27.9,adult No significant changes in weight. Wt Readings from Last 3 Encounters:  02/26/20 190 lb (86.2 kg)  12/24/19 190 lb (86.2 kg)  11/22/19 193 lb (87.5 kg)   BMI Readings from Last 3 Encounters:  02/26/20 27.26 kg/m  12/24/19 27.26 kg/m  11/22/19 27.69 kg/m      Outpatient Encounter Medications as of 05/27/2020  Medication Sig  . aspirin 325 MG EC tablet Take 325 mg by mouth daily.  . metFORMIN (GLUCOPHAGE) 1000 MG tablet Take 1 tablet (1,000 mg total) by mouth 2 (two) times daily with a meal.  . metoprolol succinate (TOPROL-XL) 50 MG 24 hr tablet TAKE 1 TABLET (50 MG TOTAL) BY MOUTH DAILY. TAKE WITH OR IMMEDIATELY FOLLOWING A MEAL.  . pantoprazole (PROTONIX) 40 MG tablet Take 1 tablet (40 mg total) by mouth daily.  . rosuvastatin (CRESTOR) 10 MG tablet Take 1 tablet (10 mg total) by  mouth daily.   No facility-administered encounter medications on file as of 05/27/2020.    Past Surgical History:  Procedure Laterality Date  . CARDIAC CATHETERIZATION    . COLON SURGERY     hemrrhoids  . CORONARY ANGIOPLASTY WITH STENT PLACEMENT      Family History  Problem Relation Age of Onset  . Diabetes Mother   . Stroke Mother   . 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  Controlled substance contract: N/A   Review of Systems  Constitutional: Negative.   HENT: Negative.   Eyes: Negative.   Respiratory: Negative.   Cardiovascular: Negative.   Gastrointestinal: Negative.   Endocrine: Negative.   Genitourinary: Negative.   Musculoskeletal: Negative.   Skin: Negative.   Neurological: Negative.   Psychiatric/Behavioral: Negative.        Objective:   Physical Exam Vitals and nursing note reviewed.  Constitutional:      Appearance: Normal appearance.  HENT:     Head: Normocephalic.     Right Ear: Tympanic membrane normal.     Left Ear: Tympanic membrane normal.     Nose: Nose normal.     Mouth/Throat:     Mouth: Mucous membranes are moist.     Pharynx: Oropharynx is clear.  Eyes:  Conjunctiva/sclera: Conjunctivae normal.     Pupils: Pupils are equal, round, and reactive to light.  Cardiovascular:     Rate and Rhythm: Normal rate and regular rhythm.     Pulses: Normal pulses.     Heart sounds: Normal heart sounds.  Pulmonary:     Effort: Pulmonary effort is normal.     Breath sounds: Normal breath sounds.  Abdominal:     General: Bowel sounds are normal.     Palpations: Abdomen is soft.  Musculoskeletal:        General: Normal range of motion.     Cervical back: Normal range of motion.  Skin:    General: Skin is warm and dry.     Capillary Refill: Capillary refill takes less than 2 seconds.  Neurological:     General: No focal deficit present.      Mental Status: He is alert and oriented to person, place, and time.  Psychiatric:        Mood and Affect: Mood normal.        Behavior: Behavior normal.    BP 123/73   Pulse 67   Temp (!) 97.5 F (36.4 C)   Ht _0  (1.778 m)   Wt 193 lb (87.5 kg)   SpO2 97%   BMI 27.69 kg/m       Assessment & Plan:  Arthur Torres comes in today with chief complaint of Medical Management of Chronic Issues   Diagnosis and orders addressed:  1. Type 2 diabetes mellitus without complication, without long-term current use of insulin ([facility]CC) Hold metformin janumaet 50/1000XR daily- samples refuses to check blood sugars - Bayer DCA Hb A1c Waived - Microalbumin / creatinine urine ratio - metFORMIN (GLUCOPHAGE) 1000 MG tablet; Take 1 tablet (1,000 mg total) by mouth 2 (two) times daily with a meal.  Dispense: 180 tablet; Refill: 1  2. Essential hypertension, benign Low sodium diet - metoprolol succinate (TOPROL-XL) 50 MG 24 hr tablet; Take 1 tablet (50 mg total) by mouth daily. Take with or immediately following a meal.  Dispense: 90 tablet; Refill: 1 - CBC with Differential/Platelet - CMP14+EGFR  3. Hyperlipidemia with target LDL less than 100 Low fat diet - rosuvastatin (CRESTOR) 10 MG tablet; Take 1 tablet (10 mg total) by mouth daily.  Dispense: 90 tablet; Refill: 1 - Lipid panel  4. BMI 27.0-27.9,adult Discussed diet and exercise for person with BMI >25 Will recheck weight in 3-6 months  5. Acute gastritis without hemorrhage, unspecified gastritis type - pantoprazole (PROTONIX) 40 MG tablet; Take 1 tablet (40 mg total) by mouth daily.  Dispense: 90 tablet; Refill: 1   Labs pending Health Maintenance reviewed Diet and exercise encouraged  Follow up plan: 1 month    [place], FNP

## 2020-05-27 NOTE — Patient Instructions (Signed)
Carbohydrate Counting for Diabetes Mellitus, Adult  Carbohydrate counting is a method of keeping track of how many carbohydrates you eat. Eating carbohydrates naturally increases the amount of sugar (glucose) in the blood. Counting how many carbohydrates you eat helps keep your blood glucose within normal limits, which helps you manage your diabetes (diabetes mellitus). It is important to know how many carbohydrates you can safely have in each meal. This is different for every person. A diet and nutrition specialist (registered dietitian) can help you make a meal plan and calculate how many carbohydrates you should have at each meal and snack. Carbohydrates are found in the following foods:  Grains, such as breads and cereals.  Dried beans and soy products.  Starchy vegetables, such as potatoes, peas, and corn.  Fruit and fruit juices.  Milk and yogurt.  Sweets and snack foods, such as cake, cookies, candy, chips, and soft drinks. How do I count carbohydrates? There are two ways to count carbohydrates in food. You can use either of the methods or a combination of both. Reading "Nutrition Facts" on packaged food The "Nutrition Facts" list is included on the labels of almost all packaged foods and beverages in the [place] It includes:  The serving size.  Information about nutrients in each serving, including the grams (g) of carbohydrate per serving. To use the "Nutrition Facts":  Decide how many servings you will have.  Multiply the number of servings by the number of carbohydrates per serving.  The resulting number is the total amount of carbohydrates that you will be having. Learning standard serving sizes of other foods When you eat carbohydrate foods that are not packaged or do not include "Nutrition Facts" on the label, you need to measure the servings in order to count the amount of carbohydrates:  Measure the foods that you will eat with a food scale or measuring cup, if  needed.  Decide how many standard-size servings you will eat.  Multiply the number of servings by 15. Most carbohydrate-rich foods have about 15 g of carbohydrates per serving. ? For example, if you eat 8 oz (170 g) of strawberries, you will have eaten 2 servings and 30 g of carbohydrates (2 servings x 15 g = 30 g).  For foods that have more than one food mixed, such as soups and casseroles, you must count the carbohydrates in each food that is included. The following list contains standard serving sizes of common carbohydrate-rich foods. Each of these servings has about 15 g of carbohydrates:   hamburger bun or  English muffin.   oz (15 mL) syrup.   oz (14 g) jelly.  1 slice of bread.  1 six-inch tortilla.  3 oz (85 g) cooked rice or pasta.  4 oz (113 g) cooked dried beans.  4 oz (113 g) starchy vegetable, such as peas, corn, or potatoes.  4 oz (113 g) hot cereal.  4 oz (113 g) mashed potatoes or  of a large baked potato.  4 oz (113 g) canned or frozen fruit.  4 oz (120 mL) fruit juice.  4-6 crackers.  6 chicken nuggets.  6 oz (170 g) unsweetened dry cereal.  6 oz (170 g) plain fat-free yogurt or yogurt sweetened with artificial sweeteners.  8 oz (240 mL) milk.  8 oz (170 g) fresh fruit or one small piece of fruit.  24 oz (680 g) popped popcorn. Example of carbohydrate counting Sample meal  3 oz (85 g) chicken breast.  6 oz (170 g)   brown rice.  4 oz (113 g) corn.  8 oz (240 mL) milk.  8 oz (170 g) strawberries with sugar-free whipped topping. Carbohydrate calculation 1. Identify the foods that contain carbohydrates: ? Rice. ? Corn. ? Milk. ? Strawberries. 2. Calculate how many servings you have of each food: ? 2 servings rice. ? 1 serving corn. ? 1 serving milk. ? 1 serving strawberries. 3. Multiply each number of servings by 15 g: ? 2 servings rice x 15 g = 30 g. ? 1 serving corn x 15 g = 15 g. ? 1 serving milk x 15 g = 15 g. ? 1  serving strawberries x 15 g = 15 g. 4. Add together all of the amounts to find the total grams of carbohydrates eaten: ? 30 g + 15 g + 15 g + 15 g = 75 g of carbohydrates total. Summary  Carbohydrate counting is a method of keeping track of how many carbohydrates you eat.  Eating carbohydrates naturally increases the amount of sugar (glucose) in the blood.  Counting how many carbohydrates you eat helps keep your blood glucose within normal limits, which helps you manage your diabetes.  A diet and nutrition specialist (registered dietitian) can help you make a meal plan and calculate how many carbohydrates you should have at each meal and snack. This information is not intended to replace advice given to you by your health care provider. Make sure you discuss any questions you have with your health care provider. Document Revised: 01/06/2017 Document Reviewed: 11/26/2015 Elsevier Patient Education  2020 Elsevier Inc.  

## 2020-05-28 LAB — CMP14+EGFR
ALT: 25 IU/L (ref 0–44)
AST: 16 IU/L (ref 0–40)
Albumin/Globulin Ratio: 1.7 (ref 1.2–2.2)
Albumin: 4.5 g/dL (ref 3.8–4.8)
Alkaline Phosphatase: 70 IU/L (ref 44–121)
BUN/Creatinine Ratio: 17 (ref 10–24)
BUN: 18 mg/dL (ref 8–27)
Bilirubin Total: 0.3 mg/dL (ref 0.0–1.2)
CO2: 24 mmol/L (ref 20–29)
Calcium: 9.9 mg/dL (ref 8.6–10.2)
Chloride: 99 mmol/L (ref 96–106)
Creatinine, Ser: 1.04 mg/dL (ref 0.76–1.27)
GFR calc Af Amer: 85 mL/min/{1.73_m2} (ref >59)
GFR calc non Af Amer: 74 mL/min/{1.73_m2} (ref >59)
Globulin, Total: 2.6 g/dL (ref 1.5–4.5)
Glucose: 162 mg/dL — ABNORMAL HIGH (ref 65–99)
Potassium: 4.7 mmol/L (ref 3.5–5.2)
Sodium: 138 mmol/L (ref 134–144)
Total Protein: 7.1 g/dL (ref 6.0–8.5)

## 2020-05-28 LAB — CBC WITH DIFFERENTIAL/PLATELET
Basophils Absolute: 0.1 10*3/uL (ref 0.0–0.2)
Basos: 2 %
EOS (ABSOLUTE): 0.3 10*3/uL (ref 0.0–0.4)
Eos: 4 %
Hematocrit: 43.7 % (ref 37.5–51.0)
Hemoglobin: 14.7 g/dL (ref 13.0–17.7)
Immature Grans (Abs): 0 10*3/uL (ref 0.0–0.1)
Immature Granulocytes: 0 %
Lymphocytes Absolute: 2.8 10*3/uL (ref 0.7–3.1)
Lymphs: 37 %
MCH: 28.9 pg (ref 26.6–33.0)
MCHC: 33.6 g/dL (ref 31.5–35.7)
MCV: 86 fL (ref 79–97)
Monocytes Absolute: 0.4 10*3/uL (ref 0.1–0.9)
Monocytes: 6 %
Neutrophils Absolute: 4 10*3/uL (ref 1.4–7.0)
Neutrophils: 51 %
Platelets: 176 10*3/uL (ref 150–450)
RBC: 5.08 x10E6/uL (ref 4.14–5.80)
RDW: 12.9 % (ref 11.6–15.4)
WBC: 7.7 10*3/uL (ref 3.4–10.8)

## 2020-05-28 LAB — LIPID PANEL
Chol/HDL Ratio: 2.9 ratio (ref 0.0–5.0)
Cholesterol, Total: 123 mg/dL (ref 100–199)
HDL: 42 mg/dL (ref >39)
LDL Chol Calc (NIH): 55 mg/dL (ref 0–99)
Triglycerides: 150 mg/dL — ABNORMAL HIGH (ref 0–149)
VLDL Cholesterol Cal: 26 mg/dL (ref 5–40)

## 2020-06-16 ENCOUNTER — Encounter: Payer: Self-pay | Admitting: Nurse Practitioner

## 2020-06-16 ENCOUNTER — Ambulatory Visit (INDEPENDENT_AMBULATORY_CARE_PROVIDER_SITE_OTHER): Payer: Medicare HMO | Admitting: Nurse Practitioner

## 2020-06-16 ENCOUNTER — Other Ambulatory Visit: Payer: Self-pay

## 2020-06-16 VITALS — BP 131/77 | HR 59 | Temp 97.4°F | Resp 20 | Ht 70.0 in | Wt 189.0 lb

## 2020-06-16 DIAGNOSIS — E119 Type 2 diabetes mellitus without complications: Secondary | ICD-10-CM

## 2020-06-16 LAB — BAYER DCA HB A1C WAIVED: HB A1C (BAYER DCA - WAIVED): 7.9 % — ABNORMAL HIGH (ref <7.0)

## 2020-06-16 MED ORDER — JANUMET XR 100-1000 MG PO TB24: 1.0000 | ORAL_TABLET | Freq: Every day | ORAL | 0 refills | Status: DC

## 2020-06-16 NOTE — Progress Notes (Signed)
   Subjective:    Patient ID: Arthur Torres, male    DOB: [birth year], 69 y.o.   MRN: [id number]   Chief Complaint: Diabetes   HPI Patient comes in for follow up of diabetes. He was last seen on 05/27/20 with a1c of 8.0. we put him on janumet 50/100 1 daily. He still has not been checking his blood sugars at home. He tries to watch diet.     Review of Systems  Constitutional: Negative for diaphoresis.  Eyes: Negative for pain.  Respiratory: Negative for shortness of breath.   Cardiovascular: Negative for chest pain, palpitations and leg swelling.  Gastrointestinal: Negative for abdominal pain.  Endocrine: Negative for polydipsia.  Skin: Negative for rash.  Neurological: Negative for dizziness, weakness and headaches.  Hematological: Does not bruise/bleed easily.  All other systems reviewed and are negative.      Objective:   Physical Exam Vitals and nursing note reviewed.  Constitutional:      Appearance: Normal appearance.  Cardiovascular:     Rate and Rhythm: Normal rate and regular rhythm.     Heart sounds: Normal heart sounds.  Pulmonary:     Breath sounds: Normal breath sounds.  Skin:    General: Skin is warm.  Neurological:     General: No focal deficit present.     Mental Status: He is oriented to person, place, and time.    BP 131/77   Pulse (!) 59   Temp (!) 97.4 F (36.3 C) (Temporal)   Resp 20   Ht 5\' 10"  (1.778 m)   Wt 189 lb (85.7 kg)   SpO2 99%   BMI 27.12 kg/m   Hgba1c 7.9%        Assessment & Plan:  Arthur Torres in today with chief complaint of Diabetes   1. Type 2 diabetes mellitus without complication, without long-term current use of insulin ([place]) Meds ordered this encounter  Medications  . SitaGLIPtin-MetFORMIN HCl (JANUMET XR) [phone] MG TB24    Sig: Take 1 tablet by mouth daily.    Dispense:  30 tablet    Refill:  0    Order Specific Question:   Supervising Provider    Answer:   Caryl Pina A [[id number]]   Watch  carbs in diet Encouraged to check blood sugars  - Bayer DCA Hb A1c Waived    The above assessment and management plan was discussed with the patient. The patient verbalized understanding of and has agreed to the management plan. Patient is aware to call the clinic if symptoms persist or worsen. Patient is aware when to return to the clinic for a follow-up visit. Patient educated on when it is appropriate to go to the emergency department.   [name] Done, FNP

## 2020-06-16 NOTE — Patient Instructions (Signed)
Carbohydrate Counting for Diabetes Mellitus, Adult  Carbohydrate counting is a method of keeping track of how many carbohydrates you eat. Eating carbohydrates naturally increases the amount of sugar (glucose) in the blood. Counting how many carbohydrates you eat helps keep your blood glucose within normal limits, which helps you manage your diabetes (diabetes mellitus). It is important to know how many carbohydrates you can safely have in each meal. This is different for every person. A diet and nutrition specialist (registered dietitian) can help you make a meal plan and calculate how many carbohydrates you should have at each meal and snack. Carbohydrates are found in the following foods:  Grains, such as breads and cereals.  Dried beans and soy products.  Starchy vegetables, such as potatoes, peas, and corn.  Fruit and fruit juices.  Milk and yogurt.  Sweets and snack foods, such as cake, cookies, candy, chips, and soft drinks. How do I count carbohydrates? There are two ways to count carbohydrates in food. You can use either of the methods or a combination of both. Reading "Nutrition Facts" on packaged food The "Nutrition Facts" list is included on the labels of almost all packaged foods and beverages in the [place] It includes:  The serving size.  Information about nutrients in each serving, including the grams (g) of carbohydrate per serving. To use the "Nutrition Facts":  Decide how many servings you will have.  Multiply the number of servings by the number of carbohydrates per serving.  The resulting number is the total amount of carbohydrates that you will be having. Learning standard serving sizes of other foods When you eat carbohydrate foods that are not packaged or do not include "Nutrition Facts" on the label, you need to measure the servings in order to count the amount of carbohydrates:  Measure the foods that you will eat with a food scale or measuring cup, if  needed.  Decide how many standard-size servings you will eat.  Multiply the number of servings by 15. Most carbohydrate-rich foods have about 15 g of carbohydrates per serving. ? For example, if you eat 8 oz (170 g) of strawberries, you will have eaten 2 servings and 30 g of carbohydrates (2 servings x 15 g = 30 g).  For foods that have more than one food mixed, such as soups and casseroles, you must count the carbohydrates in each food that is included. The following list contains standard serving sizes of common carbohydrate-rich foods. Each of these servings has about 15 g of carbohydrates:   hamburger bun or  English muffin.   oz (15 mL) syrup.   oz (14 g) jelly.  1 slice of bread.  1 six-inch tortilla.  3 oz (85 g) cooked rice or pasta.  4 oz (113 g) cooked dried beans.  4 oz (113 g) starchy vegetable, such as peas, corn, or potatoes.  4 oz (113 g) hot cereal.  4 oz (113 g) mashed potatoes or  of a large baked potato.  4 oz (113 g) canned or frozen fruit.  4 oz (120 mL) fruit juice.  4-6 crackers.  6 chicken nuggets.  6 oz (170 g) unsweetened dry cereal.  6 oz (170 g) plain fat-free yogurt or yogurt sweetened with artificial sweeteners.  8 oz (240 mL) milk.  8 oz (170 g) fresh fruit or one small piece of fruit.  24 oz (680 g) popped popcorn. Example of carbohydrate counting Sample meal  3 oz (85 g) chicken breast.  6 oz (170 g)   brown rice.  4 oz (113 g) corn.  8 oz (240 mL) milk.  8 oz (170 g) strawberries with sugar-free whipped topping. Carbohydrate calculation 1. Identify the foods that contain carbohydrates: ? Rice. ? Corn. ? Milk. ? Strawberries. 2. Calculate how many servings you have of each food: ? 2 servings rice. ? 1 serving corn. ? 1 serving milk. ? 1 serving strawberries. 3. Multiply each number of servings by 15 g: ? 2 servings rice x 15 g = 30 g. ? 1 serving corn x 15 g = 15 g. ? 1 serving milk x 15 g = 15 g. ? 1  serving strawberries x 15 g = 15 g. 4. Add together all of the amounts to find the total grams of carbohydrates eaten: ? 30 g + 15 g + 15 g + 15 g = 75 g of carbohydrates total. Summary  Carbohydrate counting is a method of keeping track of how many carbohydrates you eat.  Eating carbohydrates naturally increases the amount of sugar (glucose) in the blood.  Counting how many carbohydrates you eat helps keep your blood glucose within normal limits, which helps you manage your diabetes.  A diet and nutrition specialist (registered dietitian) can help you make a meal plan and calculate how many carbohydrates you should have at each meal and snack. This information is not intended to replace advice given to you by your health care provider. Make sure you discuss any questions you have with your health care provider. Document Revised: 01/06/2017 Document Reviewed: 11/26/2015 Elsevier Patient Education  2020 Elsevier Inc.  

## 2020-07-15 ENCOUNTER — Ambulatory Visit: Payer: Self-pay | Admitting: Nurse Practitioner

## 2020-07-23 ENCOUNTER — Other Ambulatory Visit: Payer: Self-pay

## 2020-07-23 ENCOUNTER — Ambulatory Visit (INDEPENDENT_AMBULATORY_CARE_PROVIDER_SITE_OTHER): Payer: Medicare HMO | Admitting: Nurse Practitioner

## 2020-07-23 ENCOUNTER — Encounter: Payer: Self-pay | Admitting: Nurse Practitioner

## 2020-07-23 VITALS — BP 125/74 | HR 67 | Temp 98.3°F | Resp 20 | Ht 70.0 in | Wt 193.0 lb

## 2020-07-23 DIAGNOSIS — M60271 Foreign body granuloma of soft tissue, not elsewhere classified, right ankle and foot: Secondary | ICD-10-CM | POA: Diagnosis not present

## 2020-07-23 DIAGNOSIS — E119 Type 2 diabetes mellitus without complications: Secondary | ICD-10-CM | POA: Diagnosis not present

## 2020-07-23 LAB — BAYER DCA HB A1C WAIVED: HB A1C (BAYER DCA - WAIVED): 7.5 % — ABNORMAL HIGH (ref <7.0)

## 2020-07-23 MED ORDER — JANUMET XR 100-1000 MG PO TB24
1.0000 | ORAL_TABLET | Freq: Every day | ORAL | 3 refills | Status: DC
Start: 2020-07-23 — End: 2020-10-23

## 2020-07-23 NOTE — Patient Instructions (Signed)

## 2020-07-23 NOTE — Progress Notes (Signed)
   Subjective:    Patient ID: Arthur Torres, male    DOB: 12-May-1951, 70 y.o.   MRN: [id number]   Chief Complaint: Diabetes   Patient was seen on 06/16/20 with hgba1c of 7.9%. We started him on janumet. He does not check his blood sugars. He has been trying to watch diet. He thinks he has improved. He says he has a place on his foot he wants looked at.    Review of Systems  Constitutional: Negative for diaphoresis.  Eyes: Negative for pain.  Respiratory: Negative for shortness of breath.   Cardiovascular: Negative for chest pain, palpitations and leg swelling.  Gastrointestinal: Negative for abdominal pain.  Endocrine: Negative for polydipsia.  Skin: Negative for rash.  Neurological: Negative for dizziness, weakness and headaches.  Hematological: Does not bruise/bleed easily.  All other systems reviewed and are negative.      Objective:   Physical Exam Vitals and nursing note reviewed.  Constitutional:      Appearance: Normal appearance.  Cardiovascular:     Rate and Rhythm: Normal rate and regular rhythm.     Heart sounds: Normal heart sounds.  Skin:    Comments: Small raise bump on bottom of right foot- nontender. No sign of infection.  Neurological:     General: No focal deficit present.     Mental Status: He is alert and oriented to person, place, and time.  Psychiatric:        Mood and Affect: Mood normal.        Behavior: Behavior normal.    BP 125/74   Pulse 67   Temp 98.3 F (36.8 C) (Temporal)   Resp 20   Ht 5\' 10"  (1.778 m)   Wt 193 lb (87.5 kg)   SpO2 97%   BMI 27.69 kg/m   hgba1c 7.5%      Assessment & Plan:  Arthur Torres in today with chief complaint of Diabetes   1. Type 2 diabetes mellitus without complication, without long-term current use of insulin ([facility]CC) Continue janumet- more samples given - Bayer DCA Hb A1c Waived - SitaGLIPtin-MetFORMIN HCl (JANUMET XR) [phone] MG TB24; Take 1 tablet by mouth daily.  Dispense: 30 tablet;  Refill: 3  2. Foreign body granuloma of soft tissue of right foot Put piece of fat back meat on area and see if will bring to surface Let me konw if starts hurting ( no xray available during appointment    The above assessment and management plan was discussed with the patient. The patient verbalized understanding of and has agreed to the management plan. Patient is aware to call the clinic if symptoms persist or worsen. Patient is aware when to return to the clinic for a follow-up visit. Patient educated on when it is appropriate to go to the emergency department.   [name] Done, FNP

## 2020-10-23 ENCOUNTER — Other Ambulatory Visit: Payer: Self-pay

## 2020-10-23 ENCOUNTER — Encounter: Payer: Self-pay | Admitting: Nurse Practitioner

## 2020-10-23 ENCOUNTER — Ambulatory Visit (INDEPENDENT_AMBULATORY_CARE_PROVIDER_SITE_OTHER): Payer: Medicare HMO | Admitting: Nurse Practitioner

## 2020-10-23 VITALS — BP 128/77 | HR 63 | Temp 97.8°F | Resp 20 | Ht 70.0 in | Wt 196.0 lb

## 2020-10-23 DIAGNOSIS — I10 Essential (primary) hypertension: Secondary | ICD-10-CM | POA: Diagnosis not present

## 2020-10-23 DIAGNOSIS — Z23 Encounter for immunization: Secondary | ICD-10-CM

## 2020-10-23 DIAGNOSIS — K2900 Acute gastritis without bleeding: Secondary | ICD-10-CM | POA: Diagnosis not present

## 2020-10-23 DIAGNOSIS — E785 Hyperlipidemia, unspecified: Secondary | ICD-10-CM | POA: Diagnosis not present

## 2020-10-23 DIAGNOSIS — E119 Type 2 diabetes mellitus without complications: Secondary | ICD-10-CM | POA: Diagnosis not present

## 2020-10-23 DIAGNOSIS — Z6827 Body mass index (BMI) 27.0-27.9, adult: Secondary | ICD-10-CM | POA: Diagnosis not present

## 2020-10-23 DIAGNOSIS — B070 Plantar wart: Secondary | ICD-10-CM

## 2020-10-23 LAB — BAYER DCA HB A1C WAIVED: HB A1C (BAYER DCA - WAIVED): 8.1 % — ABNORMAL HIGH (ref <7.0)

## 2020-10-23 MED ORDER — PANTOPRAZOLE SODIUM 40 MG PO TBEC: 40.0000 mg | DELAYED_RELEASE_TABLET | Freq: Every day | ORAL | 1 refills | Status: DC

## 2020-10-23 MED ORDER — METOPROLOL SUCCINATE ER 50 MG PO TB24: 50.0000 mg | ORAL_TABLET | Freq: Every day | ORAL | 1 refills | Status: DC

## 2020-10-23 MED ORDER — ROSUVASTATIN CALCIUM 10 MG PO TABS: 10.0000 mg | ORAL_TABLET | Freq: Every day | ORAL | 1 refills | Status: DC

## 2020-10-23 MED ORDER — JANUMET XR 100-1000 MG PO TB24: 1.0000 | ORAL_TABLET | Freq: Every day | ORAL | 1 refills | Status: DC

## 2020-10-23 NOTE — Addendum Note (Signed)
Addended by: Rolena Infante on: 10/23/2020 12:38 PM   Modules accepted: Orders

## 2020-10-23 NOTE — Patient Instructions (Signed)

## 2020-10-23 NOTE — Progress Notes (Signed)
Subjective:    Patient ID: Arthur Torres, male    DOB: August 27, 1950, 70 y.o.   MRN: [id number]   Chief Complaint: Medical Management of Chronic Issues    HPI:  1. Type 2 diabetes mellitus without complication, without long-term current use of insulin (HCC) He does not check his blood sugars at home; he hates pricking his blood sugars. Does not watch diet very closely. Lab Results  Component Value Date   HGBA1C 7.5 (H) 07/23/2020     2. Essential hypertension, benign No c/o chest pain, sob or headache. Does not check blood pressure. BP Readings from Last 3 Encounters:  07/23/20 125/74  06/16/20 131/77  05/27/20 123/73     3. Hyperlipidemia with target LDL less than 100 Does not watch diet and does no dedicated exercise. Lab Results  Component Value Date   CHOL 123 05/27/2020   HDL 42 05/27/2020   LDLCALC 55 05/27/2020   TRIG 150 (H) 05/27/2020   CHOLHDL 2.9 05/27/2020     4. BMI 27.0-27.9,adult No recent weight changes Wt Readings from Last 3 Encounters:  10/23/20 196 lb (88.9 kg)  07/23/20 193 lb (87.5 kg)  06/16/20 189 lb (85.7 kg)   BMI Readings from Last 3 Encounters:  10/23/20 28.12 kg/m  07/23/20 27.69 kg/m  06/16/20 27.12 kg/m      Outpatient Encounter Medications as of 10/23/2020  Medication Sig  . aspirin 325 MG EC tablet Take 325 mg by mouth daily.  . metoprolol succinate (TOPROL-XL) 50 MG 24 hr tablet Take 1 tablet (50 mg total) by mouth daily. Take with or immediately following a meal.  . pantoprazole (PROTONIX) 40 MG tablet Take 1 tablet (40 mg total) by mouth daily.  . rosuvastatin (CRESTOR) 10 MG tablet Take 1 tablet (10 mg total) by mouth daily.  . SitaGLIPtin-MetFORMIN HCl (JANUMET XR) [phone] MG TB24 Take 1 tablet by mouth daily.   No facility-administered encounter medications on file as of 10/23/2020.    Past Surgical History:  Procedure Laterality Date  . CARDIAC CATHETERIZATION    . COLON SURGERY     hemrrhoids  . CORONARY  ANGIOPLASTY WITH STENT PLACEMENT      Family History  Problem Relation Age of Onset  . Diabetes Mother   . Stroke Mother   . Cancer Father        skin, prostate, stomach    New complaints: Has a place on his right foot that will not heal. Wants referral to Dr. Irving Shows.   Social history: Lives with wife.   Controlled substance contract: n/a    Review of Systems  Constitutional: Negative for diaphoresis.  Eyes: Negative for pain.  Respiratory: Negative for shortness of breath.   Cardiovascular: Negative for chest pain, palpitations and leg swelling.  Gastrointestinal: Negative for abdominal pain.  Endocrine: Negative for polydipsia.  Skin: Negative for rash.  Neurological: Negative for dizziness, weakness and headaches.  Hematological: Does not bruise/bleed easily.  All other systems reviewed and are negative.      Objective:   Physical Exam Vitals and nursing note reviewed.  Constitutional:      Appearance: Normal appearance. He is well-developed.  HENT:     Head: Normocephalic.     Nose: Nose normal.  Eyes:     Pupils: Pupils are equal, round, and reactive to light.  Neck:     Thyroid: No thyroid mass or thyromegaly.     Vascular: No carotid bruit or JVD.     Trachea: Phonation normal.  Cardiovascular:     Rate and Rhythm: Normal rate and regular rhythm.  Pulmonary:     Effort: Pulmonary effort is normal. No respiratory distress.     Breath sounds: Normal breath sounds.  Abdominal:     General: Bowel sounds are normal.     Palpations: Abdomen is soft.     Tenderness: There is no abdominal tenderness.  Musculoskeletal:        General: Normal range of motion.     Cervical back: Normal range of motion and neck supple.  Lymphadenopathy:     Cervical: No cervical adenopathy.  Skin:    General: Skin is warm and dry.     Comments: Plantars wart on the ball of right foot  Neurological:     Mental Status: He is alert and oriented to person, place, and time.   Psychiatric:        Behavior: Behavior normal.        Thought Content: Thought content normal.        Judgment: Judgment normal.    BP 128/77   Pulse 63   Temp 97.8 F (36.6 C) (Temporal)   Resp 20   Ht _0  (1.778 m)   Wt 196 lb (88.9 kg)   SpO2 98%   BMI 28.12 kg/m   HGBA1c 8.1%       Assessment & Plan:  Arthur Torres comes in today with chief complaint of Medical Management of Chronic Issues   Diagnosis and orders addressed:  1. Type 2 diabetes mellitus without complication, without long-term current use of insulin ([place]) Get back on janumet Strict carb counting - Bayer DCA Hb A1c Waived - Microalbumin / creatinine urine ratio - SitaGLIPtin-MetFORMIN HCl (JANUMET XR) [phone] MG TB24; Take 1 tablet by mouth daily.  Dispense: 90 tablet; Refill: 1  2. Essential hypertension, benign Low sodium diet - CBC with Differential/Platelet - CMP14+EGFR - metoprolol succinate (TOPROL-XL) 50 MG 24 hr tablet; Take 1 tablet (50 mg total) by mouth daily. Take with or immediately following a meal.  Dispense: 90 tablet; Refill: 1  3. Hyperlipidemia with target LDL less than 100 Low fat diet - Lipid panel - rosuvastatin (CRESTOR) 10 MG tablet; Take 1 tablet (10 mg total) by mouth daily.  Dispense: 90 tablet; Refill: 1  4. BMI 27.0-27.9,adult Discussed diet and exercise for person with BMI >25 Will recheck weight in 3-6 months  5. Acute gastritis without hemorrhage, unspecified gastritis type - pantoprazole (PROTONIX) 40 MG tablet; Take 1 tablet (40 mg total) by mouth daily.  Dispense: 90 tablet; Refill: 1  6. Plantar wart - Ambulatory referral to [facility] pending Health Maintenance reviewed Diet and exercise encouraged  Follow up plan: 3 months   [name] Done, FNP

## 2020-10-24 LAB — CBC WITH DIFFERENTIAL/PLATELET
Basophils Absolute: 0.1 10*3/uL (ref 0.0–0.2)
Basos: 1 %
EOS (ABSOLUTE): 0.2 10*3/uL (ref 0.0–0.4)
Eos: 3 %
Hematocrit: 44.5 % (ref 37.5–51.0)
Hemoglobin: 15.3 g/dL (ref 13.0–17.7)
Immature Grans (Abs): 0 10*3/uL (ref 0.0–0.1)
Immature Granulocytes: 0 %
Lymphocytes Absolute: 2.3 10*3/uL (ref 0.7–3.1)
Lymphs: 35 %
MCH: 29.4 pg (ref 26.6–33.0)
MCHC: 34.4 g/dL (ref 31.5–35.7)
MCV: 86 fL (ref 79–97)
Monocytes Absolute: 0.4 10*3/uL (ref 0.1–0.9)
Monocytes: 7 %
Neutrophils Absolute: 3.6 10*3/uL (ref 1.4–7.0)
Neutrophils: 54 %
Platelets: 151 10*3/uL (ref 150–450)
RBC: 5.2 x10E6/uL (ref 4.14–5.80)
RDW: 13.1 % (ref 11.6–15.4)
WBC: 6.7 10*3/uL (ref 3.4–10.8)

## 2020-10-24 LAB — LIPID PANEL
Chol/HDL Ratio: 3.1 ratio (ref 0.0–5.0)
Cholesterol, Total: 119 mg/dL (ref 100–199)
HDL: 38 mg/dL — ABNORMAL LOW (ref >39)
LDL Chol Calc (NIH): 55 mg/dL (ref 0–99)
Triglycerides: 149 mg/dL (ref 0–149)
VLDL Cholesterol Cal: 26 mg/dL (ref 5–40)

## 2020-10-24 LAB — CMP14+EGFR
ALT: 22 IU/L (ref 0–44)
AST: 16 IU/L (ref 0–40)
Albumin/Globulin Ratio: 1.7 (ref 1.2–2.2)
Albumin: 4.4 g/dL (ref 3.8–4.8)
Alkaline Phosphatase: 67 IU/L (ref 44–121)
BUN/Creatinine Ratio: 22 (ref 10–24)
BUN: 23 mg/dL (ref 8–27)
Bilirubin Total: 0.3 mg/dL (ref 0.0–1.2)
CO2: 22 mmol/L (ref 20–29)
Calcium: 9.7 mg/dL (ref 8.6–10.2)
Chloride: 100 mmol/L (ref 96–106)
Creatinine, Ser: 1.05 mg/dL (ref 0.76–1.27)
Globulin, Total: 2.6 g/dL (ref 1.5–4.5)
Glucose: 173 mg/dL — ABNORMAL HIGH (ref 65–99)
Potassium: 4.5 mmol/L (ref 3.5–5.2)
Sodium: 139 mmol/L (ref 134–144)
Total Protein: 7 g/dL (ref 6.0–8.5)
eGFR: 78 mL/min/{1.73_m2} (ref >59)

## 2020-11-11 DIAGNOSIS — E1142 Type 2 diabetes mellitus with diabetic polyneuropathy: Secondary | ICD-10-CM | POA: Diagnosis not present

## 2020-11-11 DIAGNOSIS — D2371 Other benign neoplasm of skin of right lower limb, including hip: Secondary | ICD-10-CM | POA: Diagnosis not present

## 2020-12-19 LAB — HM DIABETES EYE EXAM

## 2021-01-19 ENCOUNTER — Ambulatory Visit (INDEPENDENT_AMBULATORY_CARE_PROVIDER_SITE_OTHER): Payer: Medicare HMO | Admitting: Nurse Practitioner

## 2021-01-19 ENCOUNTER — Other Ambulatory Visit: Payer: Self-pay

## 2021-01-19 ENCOUNTER — Encounter: Payer: Self-pay | Admitting: Nurse Practitioner

## 2021-01-19 VITALS — BP 118/73 | HR 60 | Temp 97.1°F | Resp 20 | Ht 70.0 in | Wt 192.0 lb

## 2021-01-19 DIAGNOSIS — Z23 Encounter for immunization: Secondary | ICD-10-CM

## 2021-01-19 DIAGNOSIS — S85149A Laceration of anterior tibial artery, unspecified leg, initial encounter: Secondary | ICD-10-CM

## 2021-01-19 NOTE — Addendum Note (Signed)
Addended by: Rolena Infante on: 01/19/2021 03:29 PM   Modules accepted: Orders

## 2021-01-19 NOTE — Progress Notes (Signed)
   Subjective:    Patient ID: Arthur Torres, male    DOB: 1951/02/08, 70 y.o.   MRN: [id number]   Chief Complaint: Cut left arm on bed frame yesterday and place on head   HPI Patient comes in with laceration to left forearm from bed frame at home. He went to urgent care, but they told him they were closing and could not see him.    Review of Systems  Skin:  Positive for wound (left forearm).  All other systems reviewed and are negative.     Objective:   Physical Exam Constitutional:      Appearance: Normal appearance.  Cardiovascular:     Rate and Rhythm: Normal rate and regular rhythm.  Pulmonary:     Breath sounds: Normal breath sounds.  Skin:    Comments: 5cm wound to left forearm. Drbrided and wound wedges unfolded with pick ups Antibiotic ointmnet and dresing applied  Neurological:     General: No focal deficit present.     Mental Status: He is alert and oriented to person, place, and time.    BP 118/73   Pulse 60   Temp (!) 97.1 F (36.2 C) (Temporal)   Resp 20   Ht '5\' 10"'$  (1.778 m)   Wt 192 lb (87.1 kg)   SpO2 96%   BMI 27.55 kg/m        Assessment & Plan:  Arthur Torres in today with chief complaint of Cut left arm on bed frame yesterday and place on head   1. Laceration of anterior tibial artery, unspecified laterality, initial encounter Leave dressing on till Thursday Will recheck wound at visit already scheduled for Thursday.    The above assessment and management plan was discussed with the patient. The patient verbalized understanding of and has agreed to the management plan. Patient is aware to call the clinic if symptoms persist or worsen. Patient is aware when to return to the clinic for a follow-up visit. Patient educated on when it is appropriate to go to the emergency department.   [name] Done, FNP

## 2021-01-21 ENCOUNTER — Ambulatory Visit (INDEPENDENT_AMBULATORY_CARE_PROVIDER_SITE_OTHER): Payer: Medicare HMO | Admitting: *Deleted

## 2021-01-21 DIAGNOSIS — Z0000 Encounter for general adult medical examination without abnormal findings: Secondary | ICD-10-CM | POA: Diagnosis not present

## 2021-01-21 NOTE — Patient Instructions (Signed)
  [place] Maintenance Summary and Written Plan of Care  Mr. Arthur Torres ,  Thank you for allowing me to perform your Medicare Annual Wellness Visit and for your ongoing commitment to your health.   Health Maintenance & Immunization History Health Maintenance  Topic Date Due   URINE MICROALBUMIN  05/20/2020   COVID-19 Vaccine (1) 02/06/2021 (Originally 05/27/1956)   INFLUENZA VACCINE  01/26/2021   HEMOGLOBIN A1C  04/24/2021   FOOT EXAM  10/23/2021   OPHTHALMOLOGY EXAM  12/19/2021   Fecal DNA (Cologuard)  09/04/2022   TETANUS/TDAP  01/20/2031   Hepatitis C Screening  Completed   PNA vac Low Risk Adult  Completed   Zoster Vaccines- Shingrix  Completed   HPV VACCINES  Aged Out   Immunization History  Administered Date(s) Administered   Influenza,inj,Quad PF,6+ Mos 04/13/2013, 04/17/2015, 03/27/2018   Pneumococcal Conjugate-13 08/21/2019   Pneumococcal Polysaccharide-23 12/11/2013, 10/23/2020   Tdap 01/19/2021   Zoster Recombinat (Shingrix) 05/03/2017, 08/08/2017    These are the patient goals that we discussed:  Goals Addressed             This Visit's Progress    AWV       01/21/2021 AWV Goal: Diabetes Management  Patient will maintain an A1C level below 8.0 Patient will not develop any diabetic foot complications Patient will not experience any hypoglycemic episodes over the next 3 months Patient will notify our office of any CBG readings outside of the provider recommended range by calling [phone] Patient will adhere to provider recommendations for diabetes management  Patient Self Management Activities take all medications as prescribed and report any negative side effects monitor and record blood sugar readings as directed adhere to a low carbohydrate diet that incorporates lean proteins, vegetables, whole grains, low glycemic fruits check feet daily noting any sores, cracks, injuries, or callous formations see PCP or podiatrist if  he notices any changes in his legs, feet, or toenails Patient will visit PCP and have an A1C level checked every 3 to 6 months as directed  have a yearly eye exam to monitor for vascular changes associated with diabetes and will request that the report be sent to his pcp.  consult with his PCP regarding any changes in his health or new or worsening symptoms          This is a list of Health Maintenance Items that are overdue or due now: Health Maintenance Due  Topic Date Due   URINE MICROALBUMIN  05/20/2020     Orders/Referrals Placed Today: No orders of the defined types were placed in this encounter.  (Contact our referral department at [phone] if you have not spoken with someone about your referral appointment within the next 5 days)    Follow-up Plan Follow-up with Chevis Pretty, FNP as planned Schedule yearly eye exam

## 2021-01-21 NOTE — Progress Notes (Addendum)
MEDICARE ANNUAL WELLNESS VISIT  01/21/2021  Telephone Visit Disclaimer This Medicare AWV was conducted by telephone due to national recommendations for restrictions regarding the COVID-19 Pandemic (e.g. social distancing).  I verified, using two identifiers, that I am speaking with Arthur Torres or their authorized healthcare agent. I discussed the limitations, risks, security, and privacy concerns of performing an evaluation and management service by telephone and the potential availability of an in-person appointment in the future. The patient expressed understanding and agreed to proceed.  Location of Patient: Home Location of Provider (nurse):  [facility]  Subjective:    Arthur ROULAND is a 70 y.o. male patient of Chevis Pretty, [place] who had a Medicare Annual Wellness Visit today via telephone. Oji is Working full time and lives with their spouse. he has 2 children. he reports that he is socially active and does interact with friends/family regularly. he is minimally physically active and enjoys music.  Patient Care Team: Chevis Pretty, FNP as PCP - General (Nurse Practitioner)  Advanced Directives 01/21/2021 09/12/2019  Does Patient Have a Medical Advance Directive? Yes No  Type of Advance Directive Living will -  Would patient like information on creating a medical advance directive? - No - Patient declined    Hospital Utilization Over the Past 12 Months: # of hospitalizations or ER visits: 0 # of surgeries: 0  Review of Systems    Patient reports that his overall health is unchanged compared to last year.  No complaints or concerns today. He does have a laceration on left arm that he was seen for on 07/25  Patient Reported Readings (BP, Pulse, CBG, Weight, etc) none  Pain Assessment Pain : No/denies pain     Current Medications & Allergies (verified) Allergies as of 01/21/2021       Reactions   Penicillins    Zocor  [simvastatin]         Medication List        Accurate as of January 21, 2021 12:02 PM. If you have any questions, ask your nurse or doctor.          aspirin 325 MG EC tablet Take 325 mg by mouth daily.   Janumet XR [phone] MG Tb24 Generic drug: SitaGLIPtin-MetFORMIN HCl Take 1 tablet by mouth daily.   metoprolol succinate 50 MG 24 hr tablet Commonly known as: TOPROL-XL Take 1 tablet (50 mg total) by mouth daily. Take with or immediately following a meal.   pantoprazole 40 MG tablet Commonly known as: PROTONIX Take 1 tablet (40 mg total) by mouth daily.   rosuvastatin 10 MG tablet Commonly known as: Crestor Take 1 tablet (10 mg total) by mouth daily.        History (reviewed): Past Medical History:  Diagnosis Date   CAD (coronary artery disease)    1999 occluded LAD treated with PCI and stenting, 80% stenosis of the first diagonal feeling angioplasty, 60-70% second stenosis, 50% ostial left main stenosis, 25% right coronary artery stenosis, 50% ostial PDA stenosis.   Cancer [facility])    prostate (treated with radiation)   COVID-19    Diabetes mellitus without complication ([place])    Hyperlipidemia    Hypertension    Joint pain    Past Surgical History:  Procedure Laterality Date   CARDIAC CATHETERIZATION     COLON SURGERY     hemrrhoids   CORONARY ANGIOPLASTY WITH STENT PLACEMENT     Family History  Problem Relation Age of Onset  Diabetes Mother    Stroke Mother    Cancer Father        skin, prostate, stomach   Healthy Daughter    Healthy Son    Asthma Son    Social History   Socioeconomic History   Marital status: Married    Spouse name: Not on file   Number of children: 2   Years of education: Not on file   Highest education level: High school graduate  Occupational History   Occupation: Autobody work    Fish farm manager: [employer]  Tobacco Use   Smoking status: Former    Types: Cigarettes    Quit date: 03/28/1998    Years since quitting: 22.8    Smokeless tobacco: Never  Vaping Use   Vaping Use: Never used  Substance and Sexual Activity   Alcohol use: No   Drug use: No   Sexual activity: Yes  Other Topics Concern   Not on file  Social History Narrative   Not on file   Social Determinants of Health   Financial Resource Strain: Not on file  Food Insecurity: Not on file  Transportation Needs: Not on file  Physical Activity: Not on file  Stress: Not on file  Social Connections: Not on file    Activities of Daily Living In your present state of health, do you have any difficulty performing the following activities: 01/21/2021  Hearing? N  Vision? N  Comment wears glasses  Difficulty concentrating or making decisions? N  Walking or climbing stairs? N  Dressing or bathing? N  Doing errands, shopping? N  Preparing Food and eating ? N  Using the Toilet? N  In the past six months, have you accidently leaked urine? N  Do you have problems with loss of bowel control? N  Managing your Medications? N  Managing your Finances? N  Housekeeping or managing your Housekeeping? N  Some recent data might be hidden    Patient Education/ Literacy How often do you need to have someone help you when you read instructions, pamphlets, or other written materials from your doctor or pharmacy?: 1 - Never  Exercise Current Exercise Habits: The patient does not participate in regular exercise at present, Exercise limited by: None identified  Diet Patient reports consuming 3 meals a day and 1 snack(s) a day Patient reports that his primary diet is: Regular Patient reports that she does have regular access to food.   Depression Screen PHQ 2/9 Scores 01/21/2021 10/23/2020 07/23/2020 06/16/2020 05/27/2020 02/26/2020 12/24/2019  PHQ - 2 Score 0 0 0 0 0 0 0     Fall Risk Fall Risk  01/21/2021 01/19/2021 10/23/2020 07/23/2020 06/16/2020  Falls in the past year? 0 0 0 0 0     Objective:  Arthur Torres seemed alert and oriented and he  participated appropriately during our telephone visit.  Blood Pressure Weight BMI  BP Readings from Last 3 Encounters:  01/19/21 118/73  10/23/20 128/77  07/23/20 125/74   Wt Readings from Last 3 Encounters:  01/19/21 192 lb (87.1 kg)  10/23/20 196 lb (88.9 kg)  07/23/20 193 lb (87.5 kg)   BMI Readings from Last 1 Encounters:  01/19/21 27.55 kg/m    *Unable to obtain current vital signs, weight, and BMI due to telephone visit type  Hearing/Vision  Cristopher did not seem to have difficulty with hearing/understanding during the telephone conversation Reports that he has not had a formal eye exam by an eye care professional within the past year  Reports that he has not had a formal hearing evaluation within the past year *Unable to fully assess hearing and vision during telephone visit type  Cognitive Function: 6CIT Screen 01/21/2021 09/12/2019  What Year? 0 points 0 points  What month? 0 points 0 points  What time? 0 points 0 points  Count back from 20 0 points 0 points  Months in reverse 0 points 0 points  Repeat phrase 0 points 0 points  Total Score 0 0   (Normal:0-7, Significant for Dysfunction: >8)  Normal Cognitive Function Screening: Yes   Immunization & Health Maintenance Record Immunization History  Administered Date(s) Administered   Influenza,inj,Quad PF,6+ Mos 04/13/2013, 04/17/2015, 03/27/2018   Pneumococcal Conjugate-13 08/21/2019   Pneumococcal Polysaccharide-23 12/11/2013, 10/23/2020   Tdap 01/19/2021   Zoster Recombinat (Shingrix) 05/03/2017, 08/08/2017    Health Maintenance  Topic Date Due   URINE MICROALBUMIN  05/20/2020   COVID-19 Vaccine (1) 02/06/2021 (Originally 05/27/1956)   INFLUENZA VACCINE  01/26/2021   HEMOGLOBIN A1C  04/24/2021   FOOT EXAM  10/23/2021   OPHTHALMOLOGY EXAM  12/19/2021   Fecal DNA (Cologuard)  09/04/2022   TETANUS/TDAP  01/20/2031   Hepatitis C Screening  Completed   PNA vac Low Risk Adult  Completed   Zoster Vaccines-  Shingrix  Completed   HPV VACCINES  Aged Out       Assessment  This is a routine wellness examination for Arthur Torres.  Health Maintenance: Due or Overdue Health Maintenance Due  Topic Date Due   URINE MICROALBUMIN  05/20/2020    Arthur Torres does not need a referral for Community Assistance: Care Management:   no Social Work:    no Prescription Assistance:  no Nutrition/Diabetes Education:  no   Plan:  Personalized Goals  Goals Addressed             This Visit's Progress    AWV       01/21/2021 AWV Goal: Diabetes Management  Patient will maintain an A1C level below 8.0 Patient will not develop any diabetic foot complications Patient will not experience any hypoglycemic episodes over the next 3 months Patient will notify our office of any CBG readings outside of the provider recommended range by calling [phone] Patient will adhere to provider recommendations for diabetes management  Patient Self Management Activities take all medications as prescribed and report any negative side effects monitor and record blood sugar readings as directed adhere to a low carbohydrate diet that incorporates lean proteins, vegetables, whole grains, low glycemic fruits check feet daily noting any sores, cracks, injuries, or callous formations see PCP or podiatrist if he notices any changes in his legs, feet, or toenails Patient will visit PCP and have an A1C level checked every 3 to 6 months as directed  have a yearly eye exam to monitor for vascular changes associated with diabetes and will request that the report be sent to his pcp.  consult with his PCP regarding any changes in his health or new or worsening symptoms        Personalized Health Maintenance & Screening Recommendations  Urine Microalbumin  Lung Cancer Screening Recommended: no (Low Dose CT Chest recommended if Age 68-80 years, 30 pack-year currently smoking OR have quit w/in past 15 years) Hepatitis C  Screening recommended: no HIV Screening recommended: no  Advanced Directives: Written information was not prepared per patient's request.  Referrals & Orders No orders of the defined types were placed in this encounter.   Follow-up Plan Follow-up  with Chevis Pretty, FNP as planned Schedule yearly eye exam AVS printed and mailed to patient    I have personally reviewed and noted the following in the patient's chart:   Medical and social history Use of alcohol, tobacco or illicit drugs  Current medications and supplements Functional ability and status Nutritional status Physical activity Advanced directives List of other physicians Hospitalizations, surgeries, and ER visits in previous 12 months Vitals Screenings to include cognitive, depression, and falls Referrals and appointments  In addition, I have reviewed and discussed with Arthur Torres certain preventive protocols, quality metrics, and best practice recommendations. A written personalized care plan for preventive services as well as general preventive health recommendations is available and can be mailed to the patient at his request.     [name], LPN  [id number]  I have reviewed and agree with the above AWV documentation.   [name] Done, FNP

## 2021-01-22 ENCOUNTER — Other Ambulatory Visit: Payer: Self-pay

## 2021-01-22 ENCOUNTER — Ambulatory Visit (INDEPENDENT_AMBULATORY_CARE_PROVIDER_SITE_OTHER): Payer: Medicare HMO | Admitting: Nurse Practitioner

## 2021-01-22 ENCOUNTER — Encounter: Payer: Self-pay | Admitting: Nurse Practitioner

## 2021-01-22 VITALS — BP 124/71 | HR 81 | Temp 98.1°F | Resp 20 | Ht 70.0 in | Wt 193.0 lb

## 2021-01-22 DIAGNOSIS — L989 Disorder of the skin and subcutaneous tissue, unspecified: Secondary | ICD-10-CM

## 2021-01-22 DIAGNOSIS — Z6827 Body mass index (BMI) 27.0-27.9, adult: Secondary | ICD-10-CM | POA: Diagnosis not present

## 2021-01-22 DIAGNOSIS — I10 Essential (primary) hypertension: Secondary | ICD-10-CM

## 2021-01-22 DIAGNOSIS — E119 Type 2 diabetes mellitus without complications: Secondary | ICD-10-CM | POA: Diagnosis not present

## 2021-01-22 DIAGNOSIS — E785 Hyperlipidemia, unspecified: Secondary | ICD-10-CM | POA: Diagnosis not present

## 2021-01-22 DIAGNOSIS — K2900 Acute gastritis without bleeding: Secondary | ICD-10-CM

## 2021-01-22 LAB — BAYER DCA HB A1C WAIVED: HB A1C (BAYER DCA - WAIVED): 7.2 % — ABNORMAL HIGH (ref <7.0)

## 2021-01-22 MED ORDER — ROSUVASTATIN CALCIUM 10 MG PO TABS: 10.0000 mg | ORAL_TABLET | Freq: Every day | ORAL | 1 refills | Status: DC

## 2021-01-22 MED ORDER — PANTOPRAZOLE SODIUM 40 MG PO TBEC: 40.0000 mg | DELAYED_RELEASE_TABLET | Freq: Every day | ORAL | 1 refills | Status: DC

## 2021-01-22 MED ORDER — METOPROLOL SUCCINATE ER 50 MG PO TB24: 50.0000 mg | ORAL_TABLET | Freq: Every day | ORAL | 1 refills | Status: DC

## 2021-01-22 MED ORDER — JANUMET XR 100-1000 MG PO TB24: 1.0000 | ORAL_TABLET | Freq: Every day | ORAL | 1 refills | Status: DC

## 2021-01-22 NOTE — Patient Instructions (Signed)
Cryoablation Cryoablation is a procedure used to remove abnormal growths or cancerous tissue. This is done by freezing the growth or tissue with either liquid nitrogen or argon gas. This procedure is also known as cryotherapy or cryosurgery. This procedure may be done to treat many conditions, including: Skin tumors. Non-cancerous (benign) knots of tissue called nodules. A type of eye cancer (retinoblastoma). Cancers of the prostate, liver, kidney, cervix, lung, and bone. Tell a health care provider about: Any allergies you have. All medicines you are taking, including vitamins, herbs, eye drops, creams, and over-the-counter medicines. Any problems you or family members have had with anesthetic medicines. Any blood disorders you have. Any surgeries you have had. Any medical conditions you have. Whether you are pregnant or may be pregnant. What are the risks? Generally, this is a safe procedure. However, problems may occur, including: Infection. Bleeding. Swelling. Allergic reactions to medicines. Damage to nearby structures or organs, such as damage to nerves, which may cause numbness. This is rare. What happens before the procedure? Staying hydrated Follow instructions from your health care provider about hydration, which may include: Up to 2 hours before the procedure - you may continue to drink clear liquids, such as water, clear fruit juice, black coffee, and plain tea.  Eating and drinking restrictions Follow instructions from your health care provider about eating and drinking, which may include: 8 hours before the procedure - stop eating heavy meals or foods, such as meat, fried foods, or fatty foods. 6 hours before the procedure - stop eating light meals or foods, such as toast or cereal. 6 hours before the procedure - stop drinking milk or drinks that contain milk. 2 hours before the procedure - stop drinking clear liquids. Medicines Ask your health care provider  about: Changing or stopping your regular medicines. This is especially important if you are taking diabetes medicines or blood thinners. Taking medicines such as aspirin and ibuprofen. These medicines can thin your blood. Do not take these medicines unless your health care provider tells you to take them. Taking over-the-counter medicines, vitamins, herbs, and supplements. Tests You may have tests done, including blood tests and imaging tests. General instructions A medical history will be taken, and a physical exam will be done. Do not use any products that contain nicotine or tobacco for at least 4 weeks before the procedure. These products include cigarettes, e-cigarettes, and chewing tobacco. If you need help quitting, ask your health care provider. Ask your health care provider: How your surgery site will be marked. What steps will be taken to help prevent infection. These may include: Removing hair at the surgery site. Washing skin with a germ-killing soap. Receiving antibiotic medicine. Plan to have someone take you home from the hospital or clinic. If you will be going home right after the procedure, plan to have someone with you for 24 hours. What happens during the procedure? An IV will be inserted into one of your veins. You will be given one or more of the following: A medicine to help you relax (sedative). A medicine to numb the area (local anesthetic). A medicine to make you fall asleep (general anesthetic). A medicine that is injected into your spine to numb the area below and slightly above the injection site (spinal anesthetic). A medicine that is injected into an area of your body to numb everything below the injection site (regional anesthetic). A device called a cryoprobe will be used to treat the growth by freezing it. The cryoprobe has liquid   nitrogen or argon gas flowing through it. Depending on how deep in the body the growth is found, the cryoprobe can be: Applied  directly to the area, such as for skin cancers or nodules. Inserted through an incision into the area, such as the prostate. Passed through a thin, long tube (endoscope) to reach deeper areas of the body, such as the lungs or liver. The cryoprobe may be guided using imaging, such as an ultrasound, a CT scan, or an MRI. Liquid nitrogen or argon gas will be delivered through the cryoprobe to the growth until it is frozen and destroyed. The process may be repeated on other areas depending on how many areas need treatment. The cryoprobe will be removed, and pressure will be applied to stop any bleeding. If an incision was made, it will be closed with stitches (sutures) and covered with a bandage (dressing). The procedure will vary depending on the location of the growth. The proceduremay also vary among health care providers and hospitals. What happens after the procedure?  Your blood pressure, heart rate, breathing rate, and blood oxygen level will be monitored until you leave the hospital or clinic. You will be given medicine to help with pain, nausea, and vomiting as needed. If you were given a sedative during the procedure, it can affect you for several hours. Do not drive or operate machinery until your health care provider says that it is safe. Summary Cryoablation is a procedure used to remove abnormal growths or cancerous tissue. This is done by freezing the growth or tissue with either liquid nitrogen or argon gas. If you will be going home right after the procedure, plan to have someone with you for 24 hours. Your health care provider may use a device called a cryoprobe that has liquid nitrogen or argon gas flowing through it. Liquid nitrogen or argon gas will be delivered through the cryoprobe to the growth until it is frozen and destroyed. The process may be repeated on other areas depending on how many areas need treatment. This information is not intended to replace advice given to you  by your health care provider. Make sure you discuss any questions you have with your healthcare provider. Document Revised: 03/22/2019 Document Reviewed: 03/22/2019 Elsevier Patient Education  2022 [employer]  

## 2021-01-22 NOTE — Progress Notes (Signed)
Subjective:    Patient ID: Arthur Torres, male    DOB: 04-20-1951, 70 y.o.   MRN: [id number]   Chief Complaint: Medical Management of Chronic Issues    HPI:  1. Type 2 diabetes mellitus without complication, without long-term current use of insulin ([place]) Starte dhim back on janumet at last visit. Hgba1c was 8.1 blood sugars since then have been running around  2. Essential hypertension, benign No c/o chest pain, sob or headache. Does not check blood pressure at home. BP Readings from Last 3 Encounters:  01/19/21 118/73  10/23/20 128/77  07/23/20 125/74     3. Hyperlipidemia with target LDL less than 100 Does not watch diet and does no dedicated exercise  4. BMI 27.0-27.9,adult No recent weight changes Wt Readings from Last 3 Encounters:  01/22/21 193 lb (87.5 kg)  01/19/21 192 lb (87.1 kg)  10/23/20 196 lb (88.9 kg)   BMI Readings from Last 3 Encounters:  01/22/21 27.69 kg/m  01/19/21 27.55 kg/m  10/23/20 28.12 kg/m       Outpatient Encounter Medications as of 01/22/2021  Medication Sig   aspirin 325 MG EC tablet Take 325 mg by mouth daily.   metoprolol succinate (TOPROL-XL) 50 MG 24 hr tablet Take 1 tablet (50 mg total) by mouth daily. Take with or immediately following a meal.   pantoprazole (PROTONIX) 40 MG tablet Take 1 tablet (40 mg total) by mouth daily.   rosuvastatin (CRESTOR) 10 MG tablet Take 1 tablet (10 mg total) by mouth daily.   SitaGLIPtin-MetFORMIN HCl (JANUMET XR) [phone] MG TB24 Take 1 tablet by mouth daily.   No facility-administered encounter medications on file as of 01/22/2021.    Past Surgical History:  Procedure Laterality Date   CARDIAC CATHETERIZATION     COLON SURGERY     hemrrhoids   CORONARY ANGIOPLASTY WITH STENT PLACEMENT      Family History  Problem Relation Age of Onset   Diabetes Mother    Stroke Mother    Cancer Father        skin, prostate, stomach   Healthy Daughter    Healthy Son    Asthma Son     New  complaints: -Has lesion on head that needs to be frozen - laceration to forearm healing nicely  Social history: Lives with wife.  Controlled substance contract: n/a     Review of Systems  Constitutional:  Negative for diaphoresis.  Eyes:  Negative for pain.  Respiratory:  Negative for shortness of breath.   Cardiovascular:  Negative for chest pain, palpitations and leg swelling.  Gastrointestinal:  Negative for abdominal pain.  Endocrine: Negative for polydipsia.  Skin:  Negative for rash.  Neurological:  Negative for dizziness, weakness and headaches.  Hematological:  Does not bruise/bleed easily.  All other systems reviewed and are negative.     Objective:   Physical Exam Vitals and nursing note reviewed.  Constitutional:      Appearance: Normal appearance. He is well-developed.  HENT:     Head: Normocephalic.     Nose: Nose normal.  Eyes:     Pupils: Pupils are equal, round, and reactive to light.  Neck:     Thyroid: No thyroid mass or thyromegaly.     Vascular: No carotid bruit or JVD.     Trachea: Phonation normal.  Cardiovascular:     Rate and Rhythm: Normal rate and regular rhythm.  Pulmonary:     Effort: Pulmonary effort is normal. No respiratory distress.  Breath sounds: Normal breath sounds.  Abdominal:     General: Bowel sounds are normal.     Palpations: Abdomen is soft.     Tenderness: There is no abdominal tenderness.  Musculoskeletal:        General: Normal range of motion.     Cervical back: Normal range of motion and neck supple.  Lymphadenopathy:     Cervical: No cervical adenopathy.  Skin:    General: Skin is warm and dry.     Comments: wOund edges left forearm well appriximated- no erythema of drainage  White 3cm raised lesion on right post scalp- cryotherapy  Neurological:     Mental Status: He is alert and oriented to person, place, and time.  Psychiatric:        Behavior: Behavior normal.        Thought Content: Thought content  normal.        Judgment: Judgment normal.    BP 124/71   Pulse 81   Temp 98.1 F (36.7 C) (Temporal)   Resp 20   Ht _0  (1.778 m)   Wt 193 lb (87.5 kg)   SpO2 99%   BMI 27.69 kg/m   Cryotherapy scalp lesion- patient tolerated well.     Assessment & Plan:  Arthur Torres comes in today with chief complaint of Medical Management of Chronic Issues   Diagnosis and orders addressed:  1. Type 2 diabetes mellitus without complication, without long-term current use of insulin ([facility]) Continue current meds Continue to watch carbs in diet - Bayer DCA Hb A1c Waived - Microalbumin / creatinine urine ratio - SitaGLIPtin-MetFORMIN HCl (JANUMET XR) [phone] MG TB24; Take 1 tablet by mouth daily.  Dispense: 90 tablet; Refill: 1  2. Essential hypertension, benign Low sodium diet - CBC with Differential/Platelet - CMP14+EGFR - metoprolol succinate (TOPROL-XL) 50 MG 24 hr tablet; Take 1 tablet (50 mg total) by mouth daily. Take with or immediately following a meal.  Dispense: 90 tablet; Refill: 1  3. Hyperlipidemia with target LDL less than 100 Low fat diet - Lipid panel - rosuvastatin (CRESTOR) 10 MG tablet; Take 1 tablet (10 mg total) by mouth daily.  Dispense: 90 tablet; Refill: 1  4. BMI 27.0-27.9,adult Discussed diet and exercise for person with BMI >25 Will recheck weight in 3-6 months  5. Acute gastritis without hemorrhage, unspecified gastritis type First 24 Hours-Clear liquids  popsicles  Jello  gatorade  Sprite Second 24 hours-Add Full liquids ( Liquids you cant see through) Third 24 hours- Bland diet ( foods that are baked or broiled)  *avoiding fried foods and highly spiced foods* During these 3 days  Avoid milk, cheese, ice cream or any other dairy products  Avoid caffeine- REMEMBER Mt. Dew and Mello Yellow contain lots of caffeine You should eat and drink in  Frequent small volumes If no improvement in symptoms or worsen in 2-3 days should RETRUN TO OFFICE or go  to ER!   - pantoprazole (PROTONIX) 40 MG tablet; Take 1 tablet (40 mg total) by mouth daily.  Dispense: 90 tablet; Refill: 1  6. Scalp lesion Cryotherapy Do not pick or scratch at area May have to retreat   Labs pending Health Maintenance reviewed Diet and exercise encouraged  Follow up plan: 3 months   [place], FNP

## 2021-01-23 LAB — CMP14+EGFR
ALT: 19 IU/L (ref 0–44)
AST: 15 IU/L (ref 0–40)
Albumin/Globulin Ratio: 2 (ref 1.2–2.2)
Albumin: 4.5 g/dL (ref 3.8–4.8)
Alkaline Phosphatase: 65 IU/L (ref 44–121)
BUN/Creatinine Ratio: 20 (ref 10–24)
BUN: 22 mg/dL (ref 8–27)
Bilirubin Total: 0.3 mg/dL (ref 0.0–1.2)
CO2: 19 mmol/L — ABNORMAL LOW (ref 20–29)
Calcium: 9.6 mg/dL (ref 8.6–10.2)
Chloride: 101 mmol/L (ref 96–106)
Creatinine, Ser: 1.1 mg/dL (ref 0.76–1.27)
Globulin, Total: 2.2 g/dL (ref 1.5–4.5)
Glucose: 123 mg/dL — ABNORMAL HIGH (ref 65–99)
Potassium: 4.3 mmol/L (ref 3.5–5.2)
Sodium: 137 mmol/L (ref 134–144)
Total Protein: 6.7 g/dL (ref 6.0–8.5)
eGFR: 74 mL/min/{1.73_m2} (ref >59)

## 2021-01-23 LAB — CBC WITH DIFFERENTIAL/PLATELET
Basophils Absolute: 0.1 10*3/uL (ref 0.0–0.2)
Basos: 1 %
EOS (ABSOLUTE): 0.2 10*3/uL (ref 0.0–0.4)
Eos: 3 %
Hematocrit: 43.2 % (ref 37.5–51.0)
Hemoglobin: 14.6 g/dL (ref 13.0–17.7)
Immature Grans (Abs): 0 10*3/uL (ref 0.0–0.1)
Immature Granulocytes: 0 %
Lymphocytes Absolute: 2.1 10*3/uL (ref 0.7–3.1)
Lymphs: 33 %
MCH: 28.7 pg (ref 26.6–33.0)
MCHC: 33.8 g/dL (ref 31.5–35.7)
MCV: 85 fL (ref 79–97)
Monocytes Absolute: 0.4 10*3/uL (ref 0.1–0.9)
Monocytes: 7 %
Neutrophils Absolute: 3.6 10*3/uL (ref 1.4–7.0)
Neutrophils: 56 %
Platelets: 165 10*3/uL (ref 150–450)
RBC: 5.09 x10E6/uL (ref 4.14–5.80)
RDW: 13 % (ref 11.6–15.4)
WBC: 6.4 10*3/uL (ref 3.4–10.8)

## 2021-01-23 LAB — LIPID PANEL
Chol/HDL Ratio: 2.8 ratio (ref 0.0–5.0)
Cholesterol, Total: 106 mg/dL (ref 100–199)
HDL: 38 mg/dL — ABNORMAL LOW (ref >39)
LDL Chol Calc (NIH): 45 mg/dL (ref 0–99)
Triglycerides: 132 mg/dL (ref 0–149)
VLDL Cholesterol Cal: 23 mg/dL (ref 5–40)

## 2021-03-14 DIAGNOSIS — N529 Male erectile dysfunction, unspecified: Secondary | ICD-10-CM | POA: Diagnosis not present

## 2021-03-14 DIAGNOSIS — Z823 Family history of stroke: Secondary | ICD-10-CM | POA: Diagnosis not present

## 2021-03-14 DIAGNOSIS — I10 Essential (primary) hypertension: Secondary | ICD-10-CM | POA: Diagnosis not present

## 2021-03-14 DIAGNOSIS — E785 Hyperlipidemia, unspecified: Secondary | ICD-10-CM | POA: Diagnosis not present

## 2021-03-14 DIAGNOSIS — Z809 Family history of malignant neoplasm, unspecified: Secondary | ICD-10-CM | POA: Diagnosis not present

## 2021-03-14 DIAGNOSIS — Z7984 Long term (current) use of oral hypoglycemic drugs: Secondary | ICD-10-CM | POA: Diagnosis not present

## 2021-03-14 DIAGNOSIS — E119 Type 2 diabetes mellitus without complications: Secondary | ICD-10-CM | POA: Diagnosis not present

## 2021-03-14 DIAGNOSIS — I252 Old myocardial infarction: Secondary | ICD-10-CM | POA: Diagnosis not present

## 2021-03-14 DIAGNOSIS — K219 Gastro-esophageal reflux disease without esophagitis: Secondary | ICD-10-CM | POA: Diagnosis not present

## 2021-03-14 DIAGNOSIS — I2510 Atherosclerotic heart disease of native coronary artery without angina pectoris: Secondary | ICD-10-CM | POA: Diagnosis not present

## 2021-04-27 ENCOUNTER — Encounter: Payer: Self-pay | Admitting: Nurse Practitioner

## 2021-04-27 ENCOUNTER — Other Ambulatory Visit: Payer: Self-pay

## 2021-04-27 ENCOUNTER — Ambulatory Visit (INDEPENDENT_AMBULATORY_CARE_PROVIDER_SITE_OTHER): Payer: Medicare HMO | Admitting: Nurse Practitioner

## 2021-04-27 VITALS — BP 116/65 | HR 57 | Temp 97.5°F | Resp 20 | Ht 70.0 in | Wt 190.0 lb

## 2021-04-27 DIAGNOSIS — K2900 Acute gastritis without bleeding: Secondary | ICD-10-CM | POA: Diagnosis not present

## 2021-04-27 DIAGNOSIS — E785 Hyperlipidemia, unspecified: Secondary | ICD-10-CM

## 2021-04-27 DIAGNOSIS — E119 Type 2 diabetes mellitus without complications: Secondary | ICD-10-CM

## 2021-04-27 DIAGNOSIS — I10 Essential (primary) hypertension: Secondary | ICD-10-CM | POA: Diagnosis not present

## 2021-04-27 DIAGNOSIS — Z6827 Body mass index (BMI) 27.0-27.9, adult: Secondary | ICD-10-CM

## 2021-04-27 LAB — BAYER DCA HB A1C WAIVED: HB A1C (BAYER DCA - WAIVED): 7.4 % — ABNORMAL HIGH (ref 4.8–5.6)

## 2021-04-27 MED ORDER — PANTOPRAZOLE SODIUM 40 MG PO TBEC: 40.0000 mg | DELAYED_RELEASE_TABLET | Freq: Every day | ORAL | 1 refills | Status: DC

## 2021-04-27 MED ORDER — METOPROLOL SUCCINATE ER 50 MG PO TB24: 50.0000 mg | ORAL_TABLET | Freq: Every day | ORAL | 1 refills | Status: DC

## 2021-04-27 MED ORDER — ROSUVASTATIN CALCIUM 10 MG PO TABS: 10.0000 mg | ORAL_TABLET | Freq: Every day | ORAL | 1 refills | Status: DC

## 2021-04-27 MED ORDER — JANUMET XR 100-1000 MG PO TB24: 1.0000 | ORAL_TABLET | Freq: Every day | ORAL | 1 refills | Status: DC

## 2021-04-27 NOTE — Progress Notes (Signed)
Subjective:    Patient ID: Arthur Torres, male    DOB: [birth year], 70 y.o.   MRN: [id number]  Chief Complaint: Medical Management of Chronic Issues    HPI:  1. Type 2 diabetes mellitus without complication, without long-term current use of insulin ([facility]) He does not check his bood sugars at home.watches dit some what. Lab Results  Component Value Date   HGBA1C 7.2 (H) 01/22/2021     2. Essential hypertension, benign No c/o chest pain, sob or headaches. Doe snot check blood pressure at home. BP Readings from Last 3 Encounters:  04/27/21 116/65  01/22/21 124/71  01/19/21 118/73     3. Hyperlipidemia with target LDL less than 100 Dos not really watch diet and does no dedicated exercise. Lab Results  Component Value Date   CHOL 106 01/22/2021   HDL 38 (L) 01/22/2021   LDLCALC 45 01/22/2021   TRIG 132 01/22/2021   CHOLHDL 2.8 01/22/2021     4. BMI 27.0-27.9,adult No recent weight changes Wt Readings from Last 3 Encounters:  04/27/21 190 lb (86.2 kg)  01/22/21 193 lb (87.5 kg)  01/19/21 192 lb (87.1 kg)   BMI Readings from Last 3 Encounters:  04/27/21 27.26 kg/m  01/22/21 27.69 kg/m  01/19/21 27.55 kg/m       Outpatient Encounter Medications as of 04/27/2021  Medication Sig   metoprolol succinate (TOPROL-XL) 50 MG 24 hr tablet Take 1 tablet (50 mg total) by mouth daily. Take with or immediately following a meal.   pantoprazole (PROTONIX) 40 MG tablet Take 1 tablet (40 mg total) by mouth daily.   rosuvastatin (CRESTOR) 10 MG tablet Take 1 tablet (10 mg total) by mouth daily.   SitaGLIPtin-MetFORMIN HCl (JANUMET XR) [phone] MG TB24 Take 1 tablet by mouth daily.   aspirin 325 MG EC tablet Take 325 mg by mouth daily. (Patient not taking: Reported on 04/27/2021)   No facility-administered encounter medications on file as of 04/27/2021.    Past Surgical History:  Procedure Laterality Date   CARDIAC CATHETERIZATION     COLON SURGERY     hemrrhoids    CORONARY ANGIOPLASTY WITH STENT PLACEMENT      Family History  Problem Relation Age of Onset   Diabetes Mother    Stroke Mother    Cancer Father        skin, prostate, stomach   Healthy Daughter    Healthy Son    Asthma Son     New complaints: None today  Social history: Lives with his wife  Controlled substance contract: n/a     Review of Systems  Constitutional:  Negative for diaphoresis.  Eyes:  Negative for pain.  Respiratory:  Negative for shortness of breath.   Cardiovascular:  Negative for chest pain, palpitations and leg swelling.  Gastrointestinal:  Negative for abdominal pain.  Endocrine: Negative for polydipsia.  Skin:  Negative for rash.  Neurological:  Negative for dizziness, weakness and headaches.  Hematological:  Does not bruise/bleed easily.  All other systems reviewed and are negative.     Objective:   Physical Exam Vitals and nursing note reviewed.  Constitutional:      Appearance: Normal appearance. He is well-developed.  HENT:     Head: Normocephalic.     Nose: Nose normal.  Eyes:     Pupils: Pupils are equal, round, and reactive to light.  Neck:     Thyroid: No thyroid mass or thyromegaly.     Vascular: No carotid bruit or  JVD.     Trachea: Phonation normal.  Cardiovascular:     Rate and Rhythm: Normal rate and regular rhythm.  Pulmonary:     Effort: Pulmonary effort is normal. No respiratory distress.     Breath sounds: Normal breath sounds.  Abdominal:     General: Bowel sounds are normal.     Palpations: Abdomen is soft.     Tenderness: There is no abdominal tenderness.  Musculoskeletal:        General: Normal range of motion.     Cervical back: Normal range of motion and neck supple.  Lymphadenopathy:     Cervical: No cervical adenopathy.  Skin:    General: Skin is warm and dry.  Neurological:     Mental Status: He is alert and oriented to person, place, and time.  Psychiatric:        Behavior: Behavior normal.         Thought Content: Thought content normal.        Judgment: Judgment normal.    BP 116/65   Pulse (!) 57   Temp (!) 97.5 F (36.4 C) (Temporal)   Resp 20   Ht _0  (1.778 m)   Wt 190 lb (86.2 kg)   SpO2 99%   BMI 27.26 kg/m   Hgba1c 7.4%     Assessment & Plan:  NALIN MAZZOCCO comes in today with chief complaint of Medical Management of Chronic Issues   Diagnosis and orders addressed:  1. Type 2 diabetes mellitus without complication, without long-term current use of insulin ([facility]) Watch carbs in diet - Bayer DCA Hb A1c Waived - Microalbumin / creatinine urine ratio - SitaGLIPtin-MetFORMIN HCl (JANUMET XR) [phone] MG TB24; Take 1 tablet by mouth daily.  Dispense: 90 tablet; Refill: 1  2. Essential hypertension, benign Low sodium diet - CBC with Differential/Platelet - CMP14+EGFR - metoprolol succinate (TOPROL-XL) 50 MG 24 hr tablet; Take 1 tablet (50 mg total) by mouth daily. Take with or immediately following a meal.  Dispense: 90 tablet; Refill: 1  3. Hyperlipidemia with target LDL less than 100 Low fat diet - Lipid panel - rosuvastatin (CRESTOR) 10 MG tablet; Take 1 tablet (10 mg total) by mouth daily.  Dispense: 90 tablet; Refill: 1  4. BMI 27.0-27.9,adult Discussed diet and exercise for person with BMI >25 Will recheck weight in 3-6 months   5. Acute gastritis without hemorrhage, unspecified gastritis type Avoid spicy foods Do not eat 2 hours prior to bedtime - pantoprazole (PROTONIX) 40 MG tablet; Take 1 tablet (40 mg total) by mouth daily.  Dispense: 90 tablet; Refill: 1   Labs pending Health Maintenance reviewed Diet and exercise encouraged  Follow up plan: 3 months   [name] Done, FNP

## 2021-04-27 NOTE — Patient Instructions (Signed)

## 2021-04-28 LAB — MICROALBUMIN / CREATININE URINE RATIO
Creatinine, Urine: 18.5 mg/dL
Microalb/Creat Ratio: <16 mg/g creat (ref 0–29)
Microalbumin, Urine: <3 ug/mL

## 2021-04-28 LAB — CBC WITH DIFFERENTIAL/PLATELET
Basophils Absolute: 0.1 10*3/uL (ref 0.0–0.2)
Basos: 1 %
EOS (ABSOLUTE): 0.2 10*3/uL (ref 0.0–0.4)
Eos: 3 %
Hematocrit: 45.9 % (ref 37.5–51.0)
Hemoglobin: 15.4 g/dL (ref 13.0–17.7)
Immature Grans (Abs): 0 10*3/uL (ref 0.0–0.1)
Immature Granulocytes: 0 %
Lymphocytes Absolute: 2.6 10*3/uL (ref 0.7–3.1)
Lymphs: 36 %
MCH: 28.5 pg (ref 26.6–33.0)
MCHC: 33.6 g/dL (ref 31.5–35.7)
MCV: 85 fL (ref 79–97)
Monocytes Absolute: 0.5 10*3/uL (ref 0.1–0.9)
Monocytes: 7 %
Neutrophils Absolute: 3.9 10*3/uL (ref 1.4–7.0)
Neutrophils: 53 %
Platelets: 166 10*3/uL (ref 150–450)
RBC: 5.41 x10E6/uL (ref 4.14–5.80)
RDW: 13.1 % (ref 11.6–15.4)
WBC: 7.4 10*3/uL (ref 3.4–10.8)

## 2021-04-28 LAB — CMP14+EGFR
ALT: 22 IU/L (ref 0–44)
AST: 15 IU/L (ref 0–40)
Albumin/Globulin Ratio: 1.6 (ref 1.2–2.2)
Albumin: 4.5 g/dL (ref 3.8–4.8)
Alkaline Phosphatase: 77 IU/L (ref 44–121)
BUN/Creatinine Ratio: 15 (ref 10–24)
BUN: 18 mg/dL (ref 8–27)
Bilirubin Total: 0.3 mg/dL (ref 0.0–1.2)
CO2: 27 mmol/L (ref 20–29)
Calcium: 9.7 mg/dL (ref 8.6–10.2)
Chloride: 99 mmol/L (ref 96–106)
Creatinine, Ser: 1.18 mg/dL (ref 0.76–1.27)
Globulin, Total: 2.8 g/dL (ref 1.5–4.5)
Glucose: 181 mg/dL — ABNORMAL HIGH (ref 70–99)
Potassium: 4.5 mmol/L (ref 3.5–5.2)
Sodium: 139 mmol/L (ref 134–144)
Total Protein: 7.3 g/dL (ref 6.0–8.5)
eGFR: 68 mL/min/{1.73_m2} (ref >59)

## 2021-04-28 LAB — LIPID PANEL
Chol/HDL Ratio: 3.1 ratio (ref 0.0–5.0)
Cholesterol, Total: 122 mg/dL (ref 100–199)
HDL: 39 mg/dL — ABNORMAL LOW (ref >39)
LDL Chol Calc (NIH): 57 mg/dL (ref 0–99)
Triglycerides: 153 mg/dL — ABNORMAL HIGH (ref 0–149)
VLDL Cholesterol Cal: 26 mg/dL (ref 5–40)

## 2021-04-29 ENCOUNTER — Telehealth: Payer: Self-pay | Admitting: Nurse Practitioner

## 2021-04-29 MED ORDER — METFORMIN HCL 1000 MG PO TABS: 1000.0000 mg | ORAL_TABLET | Freq: Two times a day (BID) | ORAL | 0 refills | Status: DC

## 2021-04-29 NOTE — Telephone Encounter (Signed)
Changed janumet to. metformin, but really need to wtach diet so HGBA1c does not go up

## 2021-04-29 NOTE — Telephone Encounter (Signed)
Patient aware and verbalized understanding. °

## 2021-05-12 ENCOUNTER — Other Ambulatory Visit: Payer: Self-pay

## 2021-05-12 ENCOUNTER — Ambulatory Visit (INDEPENDENT_AMBULATORY_CARE_PROVIDER_SITE_OTHER): Payer: Medicare HMO

## 2021-05-12 DIAGNOSIS — Z23 Encounter for immunization: Secondary | ICD-10-CM

## 2021-07-17 ENCOUNTER — Other Ambulatory Visit: Payer: Self-pay | Admitting: Nurse Practitioner

## 2021-07-28 ENCOUNTER — Encounter: Payer: Self-pay | Admitting: Nurse Practitioner

## 2021-07-28 ENCOUNTER — Ambulatory Visit (INDEPENDENT_AMBULATORY_CARE_PROVIDER_SITE_OTHER): Payer: Medicare HMO | Admitting: Nurse Practitioner

## 2021-07-28 VITALS — BP 145/80 | HR 65 | Temp 97.7°F | Resp 20 | Ht 70.0 in | Wt 193.0 lb

## 2021-07-28 DIAGNOSIS — D485 Neoplasm of uncertain behavior of skin: Secondary | ICD-10-CM

## 2021-07-28 DIAGNOSIS — K2900 Acute gastritis without bleeding: Secondary | ICD-10-CM

## 2021-07-28 DIAGNOSIS — E785 Hyperlipidemia, unspecified: Secondary | ICD-10-CM | POA: Diagnosis not present

## 2021-07-28 DIAGNOSIS — E119 Type 2 diabetes mellitus without complications: Secondary | ICD-10-CM

## 2021-07-28 DIAGNOSIS — M778 Other enthesopathies, not elsewhere classified: Secondary | ICD-10-CM | POA: Diagnosis not present

## 2021-07-28 DIAGNOSIS — Z125 Encounter for screening for malignant neoplasm of prostate: Secondary | ICD-10-CM | POA: Diagnosis not present

## 2021-07-28 DIAGNOSIS — Z8546 Personal history of malignant neoplasm of prostate: Secondary | ICD-10-CM

## 2021-07-28 DIAGNOSIS — L989 Disorder of the skin and subcutaneous tissue, unspecified: Secondary | ICD-10-CM

## 2021-07-28 DIAGNOSIS — Z6827 Body mass index (BMI) 27.0-27.9, adult: Secondary | ICD-10-CM | POA: Diagnosis not present

## 2021-07-28 DIAGNOSIS — I10 Essential (primary) hypertension: Secondary | ICD-10-CM | POA: Diagnosis not present

## 2021-07-28 LAB — LIPID PANEL

## 2021-07-28 LAB — BAYER DCA HB A1C WAIVED: HB A1C (BAYER DCA - WAIVED): 7.7 % — ABNORMAL HIGH (ref 4.8–5.6)

## 2021-07-28 MED ORDER — METFORMIN HCL 1000 MG PO TABS: 1000.0000 mg | ORAL_TABLET | Freq: Two times a day (BID) | ORAL | 1 refills | Status: DC

## 2021-07-28 MED ORDER — METOPROLOL SUCCINATE ER 50 MG PO TB24: 50.0000 mg | ORAL_TABLET | Freq: Every day | ORAL | 1 refills | Status: DC

## 2021-07-28 MED ORDER — ROSUVASTATIN CALCIUM 10 MG PO TABS: 10.0000 mg | ORAL_TABLET | Freq: Every day | ORAL | 1 refills | Status: DC

## 2021-07-28 MED ORDER — PANTOPRAZOLE SODIUM 40 MG PO TBEC: 40.0000 mg | DELAYED_RELEASE_TABLET | Freq: Every day | ORAL | 1 refills | Status: DC

## 2021-07-28 NOTE — Addendum Note (Signed)
Addended by: Chevis Pretty on: 07/28/2021 11:36 AM   Modules accepted: Orders

## 2021-07-28 NOTE — Patient Instructions (Signed)
Cryosurgery for Skin Conditions Cryosurgery, also called cryotherapy, is the use of extremely cold liquid (liquid nitrogen) to freeze and remove abnormal or diseased tissue. Cryosurgery may be used to remove certain growths on the skin, such as: Warts. Skin sores that could turn into cancer (precancerous skin lesions or actinic keratoses). Some skin cancers. Cryosurgery usually takes a few minutes, and it can be done in your health care provider's office. Tell a health care provider about: Any allergies you have. All medicines you are taking, including vitamins, herbs, eye drops, creams, and over-the-counter medicines. Any problems you or family members have had with anesthetic medicines. Any blood disorders you have. Any surgeries you have had. Any medical conditions you have. Whether you are pregnant or may be pregnant. What are the risks? Generally, this is a safe procedure. However, problems may occur, including: Infection. Bleeding. Scarring. Changes in skin color (lighter or darker than normal skin tone). Swelling. Hair loss in the treated area. Damage to nearby structures or organs, such as nerve damage and loss of feeling. This is rare. What happens before the procedure? No specific preparation is needed for this procedure. Your health care provider will describe the procedure and will discuss the benefits and risks of the procedure with you. What happens during the procedure?  Your procedure will be performed using one of the following methods: Your health care provider may apply a device (probe) to the skin. The probe has liquid nitrogen flowing through it to cool it down. The probe will be applied to the skin until the skin is frozen and destroyed. Your health care provider may apply liquid nitrogen to the skin with a swab or by spraying it on the skin until the skin is frozen and destroyed. The treated area may be covered with a bandage (dressing). These procedures may vary  among health care providers and clinics. What can I expect after procedure? After your procedure, it is common to have redness, swelling, and a blister that forms over the treated area. The blister may contain a small amount of blood. You may also have some mild stinging or a burning sensation that will resolve. If a blister forms, it will break open on its own after about 2-4 weeks, leaving a scab. Then the treated area will heal. After healing, there is usually little or no scarring. Follow these instructions at home: Caring for the treated area  Follow instructions from your health care provider about how to take care of the treated area. If you have a dressing, make sure you: Wash your hands with soap and water for at least 20 seconds before and after you change your dressing. If soap and water are not available, use hand sanitizer. Change your dressing as told by your health care provider. Keep the dressing and the treated area clean and dry. If the dressing gets wet, change it right away. Clean the treated area with soap and water. Keep the area covered with a dressing until it heals, or for as long as told by your health care provider. Check the treated area every day for signs of infection. Check for: More redness, swelling, or pain. More fluid or blood. Warmth. Pus or a bad smell. If a blister forms, do not pick at your blister or try to break it open. Doing this can cause infection and scarring. Do not apply any medicine, cream, or lotion to the treated area unless directed by your health care provider. General instructions Take over-the-counter and prescription medicines  only as told by your health care provider. Do not use any products that contain nicotine or tobacco, such as cigarettes, e-cigarettes, and chewing tobacco. These can delay healing. If you need help quitting, ask your health care provider. Do not take baths, swim, use a hot tub, hand-wash dishes, or otherwise soak  the treated area until your health care provider approves. Ask your health care provider if you may take showers. You may only be allowed to take sponge baths. Keep all follow-up visits as told by your health care provider. This is important. Contact a health care provider if: You have more redness, swelling, or pain around the treated area. You have more fluid or blood coming from the treated area. The treated area feels warm to the touch. You have pus or a bad smell coming from the treated area. Your blister becomes large and painful. Get help right away if: You have a fever and have redness spreading from the treated area. Summary Cryosurgery, also called cryotherapy, is the use of extreme cold (liquid nitrogen) to freeze and remove abnormal growths or diseased tissue. Cryosurgery usually takes a few minutes, and it can be done in your health care provider's office. Generally, this is a safe procedure that requires no specific preparation beforehand. There are two different methods for performing cryosurgery. One method involves using a device (probe) to freeze the growth, and the other method involves applying liquid nitrogen directly to the growth. After treatment with cryotherapy, follow care instructions as provided by your health care provider. Watch for signs of infection. If a blister forms, do not pick at it or try to break it open. This information is not intended to replace advice given to you by your health care provider. Make sure you discuss any questions you have with your health care provider. Document Revised: 01/31/2019 Document Reviewed: 01/31/2019 Elsevier Patient Education  [place].

## 2021-07-28 NOTE — Progress Notes (Addendum)
Subjective:    Patient ID: Arthur Torres, male    DOB: [birth year], 71 y.o.   MRN: [id number]   Chief Complaint: Medical Management of Chronic Issues (Place on back he wants you to look at/Both hands hurting and losing grip in right/)    HPI:  Arthur Torres is a 71 y.o. who identifies as a male who was assigned male at birth.   Social history: Lives with: wife Work history: Pharmacologist   Comes in today for follow up of the following chronic medical issues:  1. Type 2 diabetes mellitus without complication, without long-term current use of insulin ([place]) He does not check his blood sugars at home. He has been trying to watch his diet. Lab Results  Component Value Date   HGBA1C 7.4 (H) 04/27/2021     2. Essential hypertension, benign Torres c/o chest pain, sob or headache. Does not check blood pressure at home. BP Readings from Last 3 Encounters:  07/28/21 (!) 145/80  04/27/21 116/65  01/22/21 124/71     3. Hyperlipidemia with target LDL less than 100 Does try to watch diet but does Torres dedicated exercise. Lab Results  Component Value Date   CHOL 122 04/27/2021   HDL 39 (L) 04/27/2021   LDLCALC 57 04/27/2021   TRIG 153 (H) 04/27/2021   CHOLHDL 3.1 04/27/2021      4. H/O prostate cancer Had radiation. Denies any voiding problems Lab Results  Component Value Date   PSA1 0.1 08/21/2019   PSA1 <0.1 08/08/2017   PSA1 0.1 01/22/2016   PSA 0.2 09/03/2013      5. BMI 27.0-27.9,adult Torres recent weight changes Wt Readings from Last 3 Encounters:  07/28/21 193 lb (87.5 kg)  04/27/21 190 lb (86.2 kg)  01/22/21 193 lb (87.5 kg)   BMI Readings from Last 3 Encounters:  07/28/21 27.69 kg/m  04/27/21 27.26 kg/m  01/22/21 27.69 kg/m      New complaints: Has skin lesion on back wants looked at. Just appeared several months ago and has gotten slightly bigger. Pain in bil thumbs. Worse on right then left. Hard to pick up things with his right hand  now  Allergies  Allergen Reactions   Penicillins    Zocor [Simvastatin]    Outpatient Encounter Medications as of 07/28/2021  Medication Sig   aspirin 325 MG EC tablet Take 325 mg by mouth daily.   metFORMIN (GLUCOPHAGE) 1000 MG tablet TAKE 1 TABLET (1,000 MG TOTAL) BY MOUTH 2 (TWO) TIMES DAILY WITH A MEAL.   metoprolol succinate (TOPROL-XL) 50 MG 24 hr tablet Take 1 tablet (50 mg total) by mouth daily. Take with or immediately following a meal.   pantoprazole (PROTONIX) 40 MG tablet Take 1 tablet (40 mg total) by mouth daily.   rosuvastatin (CRESTOR) 10 MG tablet Take 1 tablet (10 mg total) by mouth daily.   Torres facility-administered encounter medications on file as of 07/28/2021.    Past Surgical History:  Procedure Laterality Date   CARDIAC CATHETERIZATION     COLON SURGERY     hemrrhoids   CORONARY ANGIOPLASTY WITH STENT PLACEMENT      Family History  Problem Relation Age of Onset   Diabetes Mother    Stroke Mother    Cancer Father        skin, prostate, stomach   Healthy Daughter    Healthy Son    Asthma Son       Controlled substance contract: n/a  Review of Systems  Constitutional:  Negative for diaphoresis.  Eyes:  Negative for pain.  Respiratory:  Negative for shortness of breath.   Cardiovascular:  Negative for chest pain, palpitations and leg swelling.  Gastrointestinal:  Negative for abdominal pain.  Endocrine: Negative for polydipsia.  Skin:  Negative for rash.  Neurological:  Negative for dizziness, weakness and headaches.  Hematological:  Does not bruise/bleed easily.  All other systems reviewed and are negative.     Objective:   Physical Exam Vitals and nursing note reviewed.  Constitutional:      Appearance: Normal appearance. He is well-developed.  HENT:     Head: Normocephalic.     Nose: Nose normal.     Mouth/Throat:     Mouth: Mucous membranes are moist.     Pharynx: Oropharynx is clear.  Eyes:     Pupils: Pupils are equal,  round, and reactive to light.  Neck:     Thyroid: Torres thyroid mass or thyromegaly.     Vascular: Torres carotid bruit or JVD.     Trachea: Phonation normal.  Cardiovascular:     Rate and Rhythm: Normal rate and regular rhythm.  Pulmonary:     Effort: Pulmonary effort is normal. Torres respiratory distress.     Breath sounds: Normal breath sounds.  Abdominal:     General: Bowel sounds are normal.     Palpations: Abdomen is soft.     Tenderness: There is Torres abdominal tenderness.  Musculoskeletal:        General: Normal range of motion.     Cervical back: Normal range of motion and neck supple.     Comments: Pain with opposition of thumb to right 5th finger bil.  Lymphadenopathy:     Cervical: Torres cervical adenopathy.  Skin:    General: Skin is warm and dry.     Comments: 2cm raised lesion on right upper back- flesh colored and scaley  Neurological:     Mental Status: He is alert and oriented to person, place, and time.  Psychiatric:        Behavior: Behavior normal.        Thought Content: Thought content normal.        Judgment: Judgment normal.   BP (!) 145/80    Pulse 65    Temp 97.7 F (36.5 C) (Temporal)    Resp 20    Ht 5' 10"  (1.778 m)    Wt 193 lb (87.5 kg)    SpO2 99%    BMI 27.69 kg/m   Cryotherapy of skin lesion on back        Assessment & Plan:   Arthur Torres comes in today with chief complaint of Medical Management of Chronic Issues (Place on back he wants you to look at/Both hands hurting and losing grip in right/)   Diagnosis and orders addressed:  1. Type 2 diabetes mellitus without complication, without long-term current use of insulin ([facility]) Stricter carb counting - Bayer DCA Hb A1c Waived  2. Essential hypertension, benign Low sodium diet - CBC with Differential/Platelet - CMP14+EGFR - metoprolol succinate (TOPROL-XL) 50 MG 24 hr tablet; Take 1 tablet (50 mg total) by mouth daily. Take with or immediately following a meal.  Dispense: 90 tablet; Refill:  1  3. Hyperlipidemia with target LDL less than 100 Low fat diet - Lipid panel - rosuvastatin (CRESTOR) 10 MG tablet; Take 1 tablet (10 mg total) by mouth daily.  Dispense: 90 tablet; Refill: 1  4. H/O  prostate cancer Labs pending - PSA, total and free  5. BMI 27.0-27.9,adult Discussed diet and exercise for person with BMI >25 Will recheck weight in 3-6 months   6. Skin lesion of back Cryotherapy of lesion-  Care discussed  7. Acute gastritis without hemorrhage, unspecified gastritis type Avoid spicy foods Do not eat 2 hours prior to bedtime - pantoprazole (PROTONIX) 40 MG tablet; Take 1 tablet (40 mg total) by mouth daily.  Dispense: 90 tablet; Refill: 1  8. Thumb tendonitis Ref to ortho  Labs pending Health Maintenance reviewed Diet and exercise encouraged  Follow up plan: 3 months   [name] Done, FNP

## 2021-07-29 LAB — CBC WITH DIFFERENTIAL/PLATELET
Basophils Absolute: 0.1 10*3/uL (ref 0.0–0.2)
Basos: 1 %
EOS (ABSOLUTE): 0.2 10*3/uL (ref 0.0–0.4)
Eos: 4 %
Hematocrit: 45.6 % (ref 37.5–51.0)
Hemoglobin: 15.1 g/dL (ref 13.0–17.7)
Immature Grans (Abs): 0 10*3/uL (ref 0.0–0.1)
Immature Granulocytes: 0 %
Lymphocytes Absolute: 2 10*3/uL (ref 0.7–3.1)
Lymphs: 36 %
MCH: 27.8 pg (ref 26.6–33.0)
MCHC: 33.1 g/dL (ref 31.5–35.7)
MCV: 84 fL (ref 79–97)
Monocytes Absolute: 0.4 10*3/uL (ref 0.1–0.9)
Monocytes: 6 %
Neutrophils Absolute: 2.9 10*3/uL (ref 1.4–7.0)
Neutrophils: 53 %
Platelets: 165 10*3/uL (ref 150–450)
RBC: 5.43 x10E6/uL (ref 4.14–5.80)
RDW: 12.7 % (ref 11.6–15.4)
WBC: 5.6 10*3/uL (ref 3.4–10.8)

## 2021-07-29 LAB — LIPID PANEL
Chol/HDL Ratio: 2.6 ratio (ref 0.0–5.0)
Cholesterol, Total: 105 mg/dL (ref 100–199)
HDL: 41 mg/dL (ref >39)
LDL Chol Calc (NIH): 45 mg/dL (ref 0–99)
Triglycerides: 104 mg/dL (ref 0–149)
VLDL Cholesterol Cal: 19 mg/dL (ref 5–40)

## 2021-07-29 LAB — CMP14+EGFR
ALT: 28 IU/L (ref 0–44)
AST: 19 IU/L (ref 0–40)
Albumin/Globulin Ratio: 2.2 (ref 1.2–2.2)
Albumin: 4.8 g/dL (ref 3.8–4.8)
Alkaline Phosphatase: 71 IU/L (ref 44–121)
BUN/Creatinine Ratio: 14 (ref 10–24)
BUN: 16 mg/dL (ref 8–27)
Bilirubin Total: 0.4 mg/dL (ref 0.0–1.2)
CO2: 27 mmol/L (ref 20–29)
Calcium: 9.8 mg/dL (ref 8.6–10.2)
Chloride: 97 mmol/L (ref 96–106)
Creatinine, Ser: 1.15 mg/dL (ref 0.76–1.27)
Globulin, Total: 2.2 g/dL (ref 1.5–4.5)
Glucose: 140 mg/dL — ABNORMAL HIGH (ref 70–99)
Potassium: 4.4 mmol/L (ref 3.5–5.2)
Sodium: 137 mmol/L (ref 134–144)
Total Protein: 7 g/dL (ref 6.0–8.5)
eGFR: 69 mL/min/{1.73_m2} (ref >59)

## 2021-07-30 LAB — PSA, TOTAL AND FREE
PSA, Free: <0.02 ng/mL
Prostate Specific Ag, Serum: <0.1 ng/mL (ref 0.0–4.0)

## 2021-07-30 LAB — SPECIMEN STATUS REPORT

## 2021-08-24 ENCOUNTER — Ambulatory Visit (INDEPENDENT_AMBULATORY_CARE_PROVIDER_SITE_OTHER): Payer: Medicare HMO

## 2021-08-24 ENCOUNTER — Ambulatory Visit: Payer: Medicare HMO | Admitting: Orthopedic Surgery

## 2021-08-24 ENCOUNTER — Encounter: Payer: Self-pay | Admitting: Orthopedic Surgery

## 2021-08-24 ENCOUNTER — Ambulatory Visit: Payer: Medicare HMO

## 2021-08-24 DIAGNOSIS — M1812 Unilateral primary osteoarthritis of first carpometacarpal joint, left hand: Secondary | ICD-10-CM | POA: Diagnosis not present

## 2021-08-24 DIAGNOSIS — M79644 Pain in right finger(s): Secondary | ICD-10-CM

## 2021-08-24 DIAGNOSIS — M1811 Unilateral primary osteoarthritis of first carpometacarpal joint, right hand: Secondary | ICD-10-CM | POA: Diagnosis not present

## 2021-08-24 DIAGNOSIS — M79645 Pain in left finger(s): Secondary | ICD-10-CM

## 2021-08-24 NOTE — Progress Notes (Signed)
New Patient Visit  Assessment: Arthur Torres is a 71 y.o. male with the following: Bilateral thumb CMC arthritis; right worse than left  Plan: Reviewed radiographs in clinic which demonstrates degenerative changes at the [facility] joint of both thumbs.  Currently, he rates his pain at a 2/10.  He occasionally takes medications.  We discussed appropriate medications.  He can consider Voltaren gel.  He can consider finding a brace for his thumbs, or a compression glove that he can wear at work.  He does not need to restrict his activities.  If he continues to have issues, we also discussed the possibility of proceeding with injections.  Follow-up in clinic as needed.   Follow-up: Return if symptoms worsen or fail to improve.  Subjective:  Chief Complaint  Patient presents with   Hand Pain    Bil thumb pain, x 6 months.  R > L.    History of Present Illness: Arthur Torres is a 71 y.o. male who has been referred to clinic today by Chevis Pretty, FNP for evaluation of bilateral thumb pain.  He has had pain in both thumbs for the past 6 months.  No specific injury.  His right is currently worse than his left.  He rates his pain as a 2/10 he will take ibuprofen occasionally.  He has never had an injection.  Does not use a brace.  No prior injuries to either thumb.  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   Review of Systems: No fevers or chills No numbness or tingling No chest pain No shortness of breath No bowel or bladder dysfunction No GI distress No headaches   Medical History:  Past Medical History:  Diagnosis Date   CAD (coronary artery disease)    1999 occluded LAD treated with PCI and stenting, 80% stenosis of the first diagonal feeling angioplasty, 60-70% second stenosis, 50% ostial left main stenosis, 25% right coronary artery stenosis, 50% ostial PDA stenosis.   Cancer [facility])    prostate (treated with radiation)   COVID-19    Diabetes  mellitus without complication ([place])    Hyperlipidemia    Hypertension    Joint pain     Past Surgical History:  Procedure Laterality Date   CARDIAC CATHETERIZATION     COLON SURGERY     hemrrhoids   CORONARY ANGIOPLASTY WITH STENT PLACEMENT      Family History  Problem Relation Age of Onset   Diabetes Mother    Stroke Mother    Cancer Father        skin, prostate, stomach   Healthy Daughter    Healthy Son    Asthma Son    Social History   Tobacco Use   Smoking status: Former    Types: Cigarettes    Quit date: 03/28/1998    Years since quitting: 23.4   Smokeless tobacco: Never  Vaping Use   Vaping Use: Never used  Substance Use Topics   Alcohol use: No   Drug use: No    Allergies  Allergen Reactions   Penicillins    Zocor [Simvastatin]     Current Meds  Medication Sig   aspirin 325 MG EC tablet Take 325 mg by mouth daily.   metFORMIN (GLUCOPHAGE) 1000 MG tablet Take 1 tablet (1,000 mg total) by mouth 2 (two) times daily with a meal.   metoprolol succinate (TOPROL-XL) 50 MG 24 hr tablet Take 1 tablet (50 mg total) by mouth daily. Take  with or immediately following a meal.   pantoprazole (PROTONIX) 40 MG tablet Take 1 tablet (40 mg total) by mouth daily.   rosuvastatin (CRESTOR) 10 MG tablet Take 1 tablet (10 mg total) by mouth daily.    Objective: There were no vitals taken for this visit.  Physical Exam:  General: Alert and oriented. and No acute distress. Gait: Normal gait.  Evaluation of bilateral hands demonstrates no deformity.  Mild tenderness to palpation with the thenar eminence.  Mild tenderness palpation at the [facility] joint.  Negative CMC grind test.  Fingers are warm and well-perfused.  He has good strength in his hands.  No numbness or tingling.  Negative Tinel's bilaterally.   IMAGING: I personally ordered and reviewed the following images  X-rays of the right thumb were obtained in clinic today.  No acute injuries are noted.  There are  degenerative changes, including cyst formation within the [facility] joint of the right thumb.  No dislocations.  Impression: Moderate to severe right CMC arthritis  X-rays of the left thumb were obtained in clinic today.  No acute injuries are noted.  There are degenerative changes within the [facility] joint of the left thumb.  No dislocations.  Impression: Moderate left CMC arthritis.   New Medications:  No orders of the defined types were placed in this encounter.     Mordecai Rasmussen, MD  08/25/2021 12:45 AM

## 2021-08-24 NOTE — Patient Instructions (Signed)
Consider medications such as ibuprofen  Voltaren gel is available over the counter  Can try a compression glove  Consider a brace  If pain worsens, can try a steroid injection

## 2021-10-26 ENCOUNTER — Encounter: Payer: Self-pay | Admitting: Nurse Practitioner

## 2021-10-26 ENCOUNTER — Ambulatory Visit (INDEPENDENT_AMBULATORY_CARE_PROVIDER_SITE_OTHER): Payer: Medicare HMO | Admitting: Nurse Practitioner

## 2021-10-26 VITALS — BP 120/70 | HR 62 | Temp 98.0°F | Resp 20 | Ht 70.0 in | Wt 192.0 lb

## 2021-10-26 DIAGNOSIS — Z6827 Body mass index (BMI) 27.0-27.9, adult: Secondary | ICD-10-CM | POA: Diagnosis not present

## 2021-10-26 DIAGNOSIS — E119 Type 2 diabetes mellitus without complications: Secondary | ICD-10-CM

## 2021-10-26 DIAGNOSIS — E785 Hyperlipidemia, unspecified: Secondary | ICD-10-CM | POA: Diagnosis not present

## 2021-10-26 DIAGNOSIS — I10 Essential (primary) hypertension: Secondary | ICD-10-CM

## 2021-10-26 DIAGNOSIS — Z8546 Personal history of malignant neoplasm of prostate: Secondary | ICD-10-CM

## 2021-10-26 LAB — BAYER DCA HB A1C WAIVED: HB A1C (BAYER DCA - WAIVED): 7.2 % — ABNORMAL HIGH (ref 4.8–5.6)

## 2021-10-26 NOTE — Progress Notes (Signed)
? ?Subjective:  ? ? Patient ID: SUNDANCE MOISE, male    DOB: 1950-07-12, 71 y.o.   MRN: [id number] ? ? ?Chief Complaint: Medical Management of Chronic Issues ?  ? ?HPI: ? ?WELCOME FULTS is a 71 y.o. who identifies as a male who was assigned male at birth.  ? ?Social history: ?Lives with: wife ?Work history: Dealer ? ? ?Comes in today for follow up of the following chronic medical issues: ? ?1. Type 2 diabetes mellitus without complication, without long-term current use of insulin ([place]) ?He does check his blood sugars. He says he cannot stand to have his finger pricked. ?Lab Results  ?Component Value Date  ? HGBA1C 7.7 (H) 07/28/2021  ? ? ? ?2. Essential hypertension, benign ?No c/o chest pain sob or headache. Does not check blood pressure at home. ?BP Readings from Last 3 Encounters:  ?10/26/21 120/70  ?07/28/21 (!) 145/80  ?04/27/21 116/65  ? ? ? ?3. Hyperlipidemia with target LDL less than 100 ?Does not watch diet and does no dedicated exercise. ?Lab Results  ?Component Value Date  ? CHOL 105 07/28/2021  ? HDL 41 07/28/2021  ? LDLCALC 45 07/28/2021  ? TRIG 104 07/28/2021  ? CHOLHDL 2.6 07/28/2021  ? ? ? ?4. H/O prostate cancer ?No problems voiding ? ?5. BMI 27.0-27.9,adult ?No recent weight changes ?Wt Readings from Last 3 Encounters:  ?10/26/21 192 lb (87.1 kg)  ?07/28/21 193 lb (87.5 kg)  ?04/27/21 190 lb (86.2 kg)  ? ?BMI Readings from Last 3 Encounters:  ?10/26/21 27.55 kg/m?  ?07/28/21 27.69 kg/m?  ?04/27/21 27.26 kg/m?  ? ? ? ? ?New complaints: ?None today ? ?Allergies  ?Allergen Reactions  ? Penicillins   ? Zocor [Simvastatin]   ? ?Outpatient Encounter Medications as of 10/26/2021  ?Medication Sig  ? aspirin 325 MG EC tablet Take 325 mg by mouth daily.  ? metFORMIN (GLUCOPHAGE) 1000 MG tablet Take 1 tablet (1,000 mg total) by mouth 2 (two) times daily with a meal.  ? metoprolol succinate (TOPROL-XL) 50 MG 24 hr tablet Take 1 tablet (50 mg total) by mouth daily. Take with or immediately following a meal.  ?  pantoprazole (PROTONIX) 40 MG tablet Take 1 tablet (40 mg total) by mouth daily.  ? rosuvastatin (CRESTOR) 10 MG tablet Take 1 tablet (10 mg total) by mouth daily.  ? ?No facility-administered encounter medications on file as of 10/26/2021.  ? ? ?Past Surgical History:  ?Procedure Laterality Date  ? CARDIAC CATHETERIZATION    ? COLON SURGERY    ? hemrrhoids  ? CORONARY ANGIOPLASTY WITH STENT PLACEMENT    ? ? ?Family History  ?Problem Relation Age of Onset  ? Diabetes Mother   ? Stroke Mother   ? Cancer Father   ?     skin, prostate, stomach  ? Healthy Daughter   ? Healthy Son   ? Asthma Son   ? ? ? ? ?Controlled substance contract: n/a ? ? ? ? ?Review of Systems  ?Constitutional:  Negative for diaphoresis.  ?Eyes:  Negative for pain.  ?Respiratory:  Negative for shortness of breath.   ?Cardiovascular:  Negative for chest pain, palpitations and leg swelling.  ?Gastrointestinal:  Negative for abdominal pain.  ?Endocrine: Negative for polydipsia.  ?Skin:  Negative for rash.  ?Neurological:  Negative for dizziness, weakness and headaches.  ?Hematological:  Does not bruise/bleed easily.  ?All other systems reviewed and are negative. ? ?   ?Objective:  ? Physical Exam ?Vitals and nursing note  reviewed.  ?Constitutional:   ?   Appearance: Normal appearance. He is well-developed.  ?HENT:  ?   Head: Normocephalic.  ?   Nose: Nose normal.  ?   Mouth/Throat:  ?   Mouth: Mucous membranes are moist.  ?   Pharynx: Oropharynx is clear.  ?Eyes:  ?   Pupils: Pupils are equal, round, and reactive to light.  ?Neck:  ?   Thyroid: No thyroid mass or thyromegaly.  ?   Vascular: No carotid bruit or JVD.  ?   Trachea: Phonation normal.  ?Cardiovascular:  ?   Rate and Rhythm: Normal rate and regular rhythm.  ?Pulmonary:  ?   Effort: Pulmonary effort is normal. No respiratory distress.  ?   Breath sounds: Normal breath sounds.  ?Abdominal:  ?   General: Bowel sounds are normal.  ?   Palpations: Abdomen is soft.  ?   Tenderness: There is no  abdominal tenderness.  ?Musculoskeletal:     ?   General: Normal range of motion.  ?   Cervical back: Normal range of motion and neck supple.  ?Lymphadenopathy:  ?   Cervical: No cervical adenopathy.  ?Skin: ?   General: Skin is warm and dry.  ?Neurological:  ?   Mental Status: He is alert and oriented to person, place, and time.  ?Psychiatric:     ?   Behavior: Behavior normal.     ?   Thought Content: Thought content normal.     ?   Judgment: Judgment normal.  ? ? ?HGBA1c 7.2% ? ?BP 120/70   Pulse 62   Temp 98 ?F (36.7 ?C) (Temporal)   Resp 20   Ht 5' 10"  (1.778 m)   Wt 192 lb (87.1 kg)   SpO2 99%   BMI 27.55 kg/m?  ? ?   ?Assessment & Plan:  ?ZAMIER EGGEBRECHT comes in today with chief complaint of Medical Management of Chronic Issues ? ? ?Diagnosis and orders addressed: ? ?1. Type 2 diabetes mellitus without complication, without long-term current use of insulin ([place]) ?Continue to watch carbs in diet ?- Bayer DCA Hb A1c Waived ? ?2. Essential hypertension, benign ?Low sodium diet ?- CBC with Differential/Platelet ?- CMP14+EGFR ? ?3. Hyperlipidemia with target LDL less than 100 ?Low fat diet ?- Lipid panel ? ?4. H/O prostate cancer ?Labs pending ? ?5. BMI 27.0-27.9,adult ?Discussed diet and exercise for person with BMI >25 ?Will recheck weight in 3-6 months ? ? ? ?Labs pending ?Health Maintenance reviewed ?Diet and exercise encouraged ? ?Follow up plan: ?3 months ? ? ?[name] Done, FNP ? ? ?

## 2021-10-26 NOTE — Patient Instructions (Signed)
Diabetes Mellitus and Foot Care Foot care is an important part of your health, especially when you have diabetes. Diabetes may cause you to have problems because of poor blood flow (circulation) to your feet and legs, which can cause your skin to: Become thinner and drier. Break more easily. Heal more slowly. Peel and crack. You may also have nerve damage (neuropathy) in your legs and feet, causing decreased feeling in them. This means that you may not notice minor injuries to your feet that could lead to more serious problems. Noticing and addressing any potential problems early is the best way to prevent future foot problems. How to care for your feet Foot hygiene  Wash your feet daily with warm water and mild soap. Do not use hot water. Then, pat your feet and the areas between your toes until they are completely dry. Do not soak your feet as this can dry your skin. Trim your toenails straight across. Do not dig under them or around the cuticle. File the edges of your nails with an emery board or nail file. Apply a moisturizing lotion or petroleum jelly to the skin on your feet and to dry, brittle toenails. Use lotion that does not contain alcohol and is unscented. Do not apply lotion between your toes. Shoes and socks Wear clean socks or stockings every day. Make sure they are not too tight. Do not wear knee-high stockings since they may decrease blood flow to your legs. Wear shoes that fit properly and have enough cushioning. Always look in your shoes before you put them on to be sure there are no objects inside. To break in new shoes, wear them for just a few hours a day. This prevents injuries on your feet. Wounds, scrapes, corns, and calluses  Check your feet daily for blisters, cuts, bruises, sores, and redness. If you cannot see the bottom of your feet, use a mirror or ask someone for help. Do not cut corns or calluses or try to remove them with medicine. If you find a minor scrape,  cut, or break in the skin on your feet, keep it and the skin around it clean and dry. You may clean these areas with mild soap and water. Do not clean the area with peroxide, alcohol, or iodine. If you have a wound, scrape, corn, or callus on your foot, look at it several times a day to make sure it is healing and not infected. Check for: Redness, swelling, or pain. Fluid or blood. Warmth. Pus or a bad smell. General tips Do not cross your legs. This may decrease blood flow to your feet. Do not use heating pads or hot water bottles on your feet. They may burn your skin. If you have lost feeling in your feet or legs, you may not know this is happening until it is too late. Protect your feet from hot and cold by wearing shoes, such as at the beach or on hot pavement. Schedule a complete foot exam at least once a year (annually) or more often if you have foot problems. Report any cuts, sores, or bruises to your health care provider immediately. Where to find more information American Diabetes Association: www.diabetes.org Association of Diabetes Care & Education Specialists: www.diabeteseducator.org Contact a health care provider if: You have a medical condition that increases your risk of infection and you have any cuts, sores, or bruises on your feet. You have an injury that is not healing. You have redness on your legs or feet. You   feel burning or tingling in your legs or feet. You have pain or cramps in your legs and feet. Your legs or feet are numb. Your feet always feel cold. You have pain around any toenails. Get help right away if: You have a wound, scrape, corn, or callus on your foot and: You have pain, swelling, or redness that gets worse. You have fluid or blood coming from the wound, scrape, corn, or callus. Your wound, scrape, corn, or callus feels warm to the touch. You have pus or a bad smell coming from the wound, scrape, corn, or callus. You have a fever. You have a red  line going up your leg. Summary Check your feet every day for blisters, cuts, bruises, sores, and redness. Apply a moisturizing lotion or petroleum jelly to the skin on your feet and to dry, brittle toenails. Wear shoes that fit properly and have enough cushioning. If you have foot problems, report any cuts, sores, or bruises to your health care provider immediately. Schedule a complete foot exam at least once a year (annually) or more often if you have foot problems. This information is not intended to replace advice given to you by your health care provider. Make sure you discuss any questions you have with your health care provider. Document Revised: 01/03/2020 Document Reviewed: 01/03/2020 Elsevier Patient Education  2023 [employer]  

## 2021-10-27 LAB — CBC WITH DIFFERENTIAL/PLATELET
Basophils Absolute: 0.1 10*3/uL (ref 0.0–0.2)
Basos: 1 %
EOS (ABSOLUTE): 0.2 10*3/uL (ref 0.0–0.4)
Eos: 2 %
Hematocrit: 44.5 % (ref 37.5–51.0)
Hemoglobin: 15.7 g/dL (ref 13.0–17.7)
Immature Grans (Abs): 0 10*3/uL (ref 0.0–0.1)
Immature Granulocytes: 0 %
Lymphocytes Absolute: 2.2 10*3/uL (ref 0.7–3.1)
Lymphs: 33 %
MCH: 30.3 pg (ref 26.6–33.0)
MCHC: 35.3 g/dL (ref 31.5–35.7)
MCV: 86 fL (ref 79–97)
Monocytes Absolute: 0.4 10*3/uL (ref 0.1–0.9)
Monocytes: 6 %
Neutrophils Absolute: 3.8 10*3/uL (ref 1.4–7.0)
Neutrophils: 58 %
Platelets: 151 10*3/uL (ref 150–450)
RBC: 5.19 x10E6/uL (ref 4.14–5.80)
RDW: 13 % (ref 11.6–15.4)
WBC: 6.5 10*3/uL (ref 3.4–10.8)

## 2021-10-27 LAB — CMP14+EGFR
ALT: 22 IU/L (ref 0–44)
AST: 16 IU/L (ref 0–40)
Albumin/Globulin Ratio: 1.9 (ref 1.2–2.2)
Albumin: 4.4 g/dL (ref 3.8–4.8)
Alkaline Phosphatase: 76 IU/L (ref 44–121)
BUN/Creatinine Ratio: 19 (ref 10–24)
BUN: 23 mg/dL (ref 8–27)
Bilirubin Total: 0.4 mg/dL (ref 0.0–1.2)
CO2: 23 mmol/L (ref 20–29)
Calcium: 9.6 mg/dL (ref 8.6–10.2)
Chloride: 100 mmol/L (ref 96–106)
Creatinine, Ser: 1.2 mg/dL (ref 0.76–1.27)
Globulin, Total: 2.3 g/dL (ref 1.5–4.5)
Glucose: 228 mg/dL — ABNORMAL HIGH (ref 70–99)
Potassium: 4.4 mmol/L (ref 3.5–5.2)
Sodium: 137 mmol/L (ref 134–144)
Total Protein: 6.7 g/dL (ref 6.0–8.5)
eGFR: 66 mL/min/{1.73_m2} (ref >59)

## 2021-10-27 LAB — LIPID PANEL
Chol/HDL Ratio: 3.3 ratio (ref 0.0–5.0)
Cholesterol, Total: 119 mg/dL (ref 100–199)
HDL: 36 mg/dL — ABNORMAL LOW (ref >39)
LDL Chol Calc (NIH): 53 mg/dL (ref 0–99)
Triglycerides: 179 mg/dL — ABNORMAL HIGH (ref 0–149)
VLDL Cholesterol Cal: 30 mg/dL (ref 5–40)

## 2021-11-29 DIAGNOSIS — Z87891 Personal history of nicotine dependence: Secondary | ICD-10-CM | POA: Diagnosis not present

## 2021-11-29 DIAGNOSIS — Z8546 Personal history of malignant neoplasm of prostate: Secondary | ICD-10-CM | POA: Diagnosis not present

## 2021-11-29 DIAGNOSIS — Z823 Family history of stroke: Secondary | ICD-10-CM | POA: Diagnosis not present

## 2021-11-29 DIAGNOSIS — Z809 Family history of malignant neoplasm, unspecified: Secondary | ICD-10-CM | POA: Diagnosis not present

## 2021-11-29 DIAGNOSIS — E785 Hyperlipidemia, unspecified: Secondary | ICD-10-CM | POA: Diagnosis not present

## 2021-11-29 DIAGNOSIS — I252 Old myocardial infarction: Secondary | ICD-10-CM | POA: Diagnosis not present

## 2021-11-29 DIAGNOSIS — E119 Type 2 diabetes mellitus without complications: Secondary | ICD-10-CM | POA: Diagnosis not present

## 2021-11-29 DIAGNOSIS — M1990 Unspecified osteoarthritis, unspecified site: Secondary | ICD-10-CM | POA: Diagnosis not present

## 2021-11-29 DIAGNOSIS — Z7984 Long term (current) use of oral hypoglycemic drugs: Secondary | ICD-10-CM | POA: Diagnosis not present

## 2021-11-29 DIAGNOSIS — K219 Gastro-esophageal reflux disease without esophagitis: Secondary | ICD-10-CM | POA: Diagnosis not present

## 2021-11-29 DIAGNOSIS — Z833 Family history of diabetes mellitus: Secondary | ICD-10-CM | POA: Diagnosis not present

## 2021-11-29 DIAGNOSIS — I10 Essential (primary) hypertension: Secondary | ICD-10-CM | POA: Diagnosis not present

## 2022-01-22 ENCOUNTER — Other Ambulatory Visit: Payer: Self-pay | Admitting: Nurse Practitioner

## 2022-01-22 ENCOUNTER — Ambulatory Visit (INDEPENDENT_AMBULATORY_CARE_PROVIDER_SITE_OTHER): Payer: Medicare HMO

## 2022-01-22 VITALS — Wt 192.0 lb

## 2022-01-22 DIAGNOSIS — Z0000 Encounter for general adult medical examination without abnormal findings: Secondary | ICD-10-CM | POA: Diagnosis not present

## 2022-01-22 NOTE — Progress Notes (Signed)
Subjective:   Arthur Torres is a 71 y.o. male who presents for Medicare Annual/Subsequent preventive examination.  Virtual Visit via Telephone Note  I connected with  Arthur Torres on 01/22/22 at  4:15 PM EDT by telephone and verified that I am speaking with the correct person using two identifiers.  Location: Patient: Home Provider: [facility] Persons participating in the virtual visit: patient/Nurse Health Advisor   I discussed the limitations, risks, security and privacy concerns of performing an evaluation and management service by telephone and the availability of in person appointments. The patient expressed understanding and agreed to proceed.  Interactive audio and video telecommunications were attempted between this nurse and patient, however failed, due to patient having technical difficulties OR patient did not have access to video capability.  We continued and completed visit with audio only.  Some vital signs may be absent or patient reported.   Arthur Clyatt E Zacary Bauer, LPN   Review of Systems     Cardiac Risk Factors include: advanced age (>25mn, >>60women);diabetes mellitus;dyslipidemia;hypertension     Objective:    Today's Vitals   01/22/22 1555  Weight: 192 lb (87.1 kg)   Body mass index is 27.55 kg/m.     01/22/2022    4:00 PM 01/21/2021   11:37 AM 09/12/2019    8:39 AM  Advanced Directives  Does Patient Have a Medical Advance Directive? Yes Yes No  Type of AParamedicof AM[place]berryLiving will Living will   Copy of HBad A[place] Chart? No - copy requested    Would patient like information on creating a medical advance directive?   No - Patient declined    Current Medications (verified) Outpatient Encounter Medications as of 01/22/2022  Medication Sig   aspirin 325 MG EC tablet Take 325 mg by mouth daily.   metFORMIN (GLUCOPHAGE) 1000 MG tablet TAKE 1 TABLET (1,000 MG TOTAL) BY MOUTH TWICE A DAY WITH FOOD   metoprolol  succinate (TOPROL-XL) 50 MG 24 hr tablet Take 1 tablet (50 mg total) by mouth daily. Take with or immediately following a meal.   pantoprazole (PROTONIX) 40 MG tablet Take 1 tablet (40 mg total) by mouth daily.   rosuvastatin (CRESTOR) 10 MG tablet Take 1 tablet (10 mg total) by mouth daily.   No facility-administered encounter medications on file as of 01/22/2022.    Allergies (verified) Penicillins and Zocor [simvastatin]   History: Past Medical History:  Diagnosis Date   CAD (coronary artery disease)    1999 occluded LAD treated with PCI and stenting, 80% stenosis of the first diagonal feeling angioplasty, 60-70% second stenosis, 50% ostial left main stenosis, 25% right coronary artery stenosis, 50% ostial PDA stenosis.   Cancer ([facility]    prostate (treated with radiation)   COVID-19    Diabetes mellitus without complication [place]    Hyperlipidemia    Hypertension    Joint pain    Past Surgical History:  Procedure Laterality Date   CARDIAC CATHETERIZATION     COLON SURGERY     hemrrhoids   CORONARY ANGIOPLASTY WITH STENT PLACEMENT     Family History  Problem Relation Age of Onset   Diabetes Mother    Stroke Mother    Cancer Father        skin, prostate, stomach   Healthy Daughter    Healthy Son    Asthma Son    Social History   Socioeconomic History   Marital status: Married    Spouse  name: Manuela Schwartz   Number of children: 2   Years of education: Not on file   Highest education level: High school graduate  Occupational History   Occupation: Autobody work    Fish farm manager: [employer]  Tobacco Use   Smoking status: Former    Types: Cigarettes    Quit date: 03/28/1998    Years since quitting: 23.8   Smokeless tobacco: Never  Vaping Use   Vaping Use: Never used  Substance and Sexual Activity   Alcohol use: No   Drug use: No   Sexual activity: Yes  Other Topics Concern   Not on file  Social History Narrative   Daughter in Designer, jewellery in [place] Strain: [place]  (01/22/2022)   Overall Financial Resource Strain (CARDIA)    Difficulty of Paying Living Expenses: Not hard at all  Food Insecurity: No Food Insecurity (01/22/2022)   Hunger Vital Sign    Worried About Running Out of Food in the Last Year: Never true    [place] in the Last Year: Never true  Transportation Needs: No Transportation Needs (01/22/2022)   PRAPARE - Hydrologist (Medical): No    Lack of Transportation (Non-Medical): No  Physical Activity: Sufficiently Active (01/22/2022)   Exercise Vital Sign    Days of Exercise per Week: 7 days    Minutes of Exercise per Session: 30 min  Stress: No Stress Concern Present (01/22/2022)   [place]    Feeling of Stress : Not at all  Social Connections: Moderately Integrated (01/22/2022)   Social Connection and Isolation Panel [NHANES]    Frequency of Communication with Friends and Family: More than three times a week    Frequency of Social Gatherings with Friends and Family: More than three times a week    Attends Religious Services: Never    Marine scientist or Organizations: Yes    Attends Music therapist: More than 4 times per year    Marital Status: Married    Tobacco Counseling Counseling given: Not Answered   Clinical Intake:  Pre-visit preparation completed: Yes  Pain : No/denies pain     BMI - recorded: 27.55 Nutritional Status: BMI 25 -29 Overweight Nutritional Risks: None Diabetes: Yes CBG done?: No Did pt. bring in CBG monitor from home?: No  How often do you need to have someone help you when you read instructions, pamphlets, or other written materials from your doctor or pharmacy?: 1 - Never  Diabetic? Nutrition Risk Assessment:  Has the patient had any N/V/D within the last 2 months?  No  Does the patient have any non-healing  wounds?  No  Has the patient had any unintentional weight loss or weight gain?  No   Diabetes:  Is the patient diabetic?  Yes  If diabetic, was a CBG obtained today?  No  Did the patient bring in their glucometer from home?  No  How often do you monitor your CBG's? never.   Financial Strains and Diabetes Management:  Are you having any financial strains with the device, your supplies or your medication? No .  Does the patient want to be seen by Chronic Care Management for management of their diabetes?  No  Would the patient like to be referred to a Nutritionist or for Diabetic Management?  No   Diabetic Exams:  Diabetic Eye Exam:  Completed 12/19/2020. Overdue for diabetic eye exam. Pt has been advised about the importance in completing this exam. He is going to make an appt soon.  Diabetic Foot Exam: Completed 10/26/2021. Pt has been advised about the importance in completing this exam.    Interpreter Needed?: No  Information entered by :: British Moyd, LPN   Activities of Daily Living    01/22/2022    4:02 PM  In your present state of health, do you have any difficulty performing the following activities:  Hearing? 0  Vision? 0  Difficulty concentrating or making decisions? 0  Walking or climbing stairs? 0  Dressing or bathing? 0  Doing errands, shopping? 0  Preparing Food and eating ? N  Using the Toilet? N  In the past six months, have you accidently leaked urine? N  Do you have problems with loss of bowel control? N  Managing your Medications? N  Managing your Finances? N  Housekeeping or managing your Housekeeping? N    Patient Care Team: Chevis Pretty, FNP as PCP - General (Nurse Practitioner)  Indicate any recent Medical Services you may have received from other than [facility] providers in the past year (date may be approximate).     Assessment:   This is a routine wellness examination for Victorino.  Hearing/Vision screen Hearing Screening - Comments::  Denies hearing difficulties   Vision Screening - Comments:: Wears rx glasses - up to date with routine eye exams with optometrist in [place] - will likely go to [place] next time  Dietary issues and exercise activities discussed: Current Exercise Habits: Home exercise routine, Type of exercise: walking;Other - see comments (yard work), Time (Minutes): 30, Frequency (Times/Week): 7, Weekly Exercise (Minutes/Week): 210, Intensity: Mild, Exercise limited by: None identified   Goals Addressed             This Visit's Progress    AWV   On track    01/22/2022 AWV Goal: Diabetes Management  Patient will maintain an A1C level below 8.0 Patient will not develop any diabetic foot complications Patient will not experience any hypoglycemic episodes over the next 3 months Patient will notify our office of any CBG readings outside of the provider recommended range by calling [phone] Patient will adhere to provider recommendations for diabetes management  Patient Self Management Activities take all medications as prescribed and report any negative side effects monitor and record blood sugar readings as directed adhere to a low carbohydrate diet that incorporates lean proteins, vegetables, whole grains, low glycemic fruits check feet daily noting any sores, cracks, injuries, or callous formations see PCP or podiatrist if he notices any changes in his legs, feet, or toenails Patient will visit PCP and have an A1C level checked every 3 to 6 months as directed  have a yearly eye exam to monitor for vascular changes associated with diabetes and will request that the report be sent to his pcp.  consult with his PCP regarding any changes in his health or new or worsening symptoms      Exercise 150 min/wk Moderate Activity   On track      Depression Screen    01/22/2022    3:58 PM 10/26/2021   11:42 AM 07/28/2021   10:47 AM 04/27/2021   12:08 PM 01/22/2021   11:54 AM 01/21/2021    11:39 AM 10/23/2020   11:41 AM  PHQ 2/9 Scores  PHQ - 2 Score 0 0 0 0 0 0 0  PHQ- 9  Score  0 0 0 0      Fall Risk    01/22/2022    3:57 PM 10/26/2021   11:42 AM 07/28/2021   10:47 AM 04/27/2021   12:08 PM 01/22/2021   11:54 AM  Fall Risk   Falls in the past year? 0 0 0 0 0  Number falls in past yr: 0      Injury with Fall? 0      Risk for fall due to : No Fall Risks      Follow up Falls prevention discussed        [place]:  Any stairs in or around the home? No  If so, are there any without handrails? No  Home free of loose throw rugs in walkways, pet beds, electrical cords, etc? Yes  Adequate lighting in your home to reduce risk of falls? Yes   ASSISTIVE DEVICES UTILIZED TO PREVENT FALLS:  Life alert? No  Use of a cane, walker or w/c? No  Grab bars in the bathroom? Yes  Shower chair or bench in shower? No  Elevated toilet seat or a handicapped toilet? No   TIMED UP AND GO:  Was the test performed? No . Telephonic visit  Cognitive Function:    09/11/2018    4:14 PM  MMSE - Mini Mental State Exam  Orientation to time 5  Orientation to Place 5  Registration 3  Attention/ Calculation 5  Recall 3  Language- name 2 objects 2  Language- repeat 1  Language- follow 3 step command 3  Language- read & follow direction 1  Write a sentence 1  Copy design 1  Total score 30        01/22/2022    4:03 PM 01/21/2021   11:39 AM 09/12/2019    8:40 AM  6CIT Screen  What Year? 0 points 0 points 0 points  What month? 0 points 0 points 0 points  What time? 0 points 0 points 0 points  Count back from 20 0 points 0 points 0 points  Months in reverse 0 points 0 points 0 points  Repeat phrase 0 points 0 points 0 points  Total Score 0 points 0 points 0 points    Immunizations Immunization History  Administered Date(s) Administered   Fluad Quad(high Dose 65+) 05/12/2021   Influenza,inj,Quad PF,6+ Mos 04/13/2013, 04/17/2015, 03/27/2018    Pneumococcal Conjugate-13 08/21/2019   Pneumococcal Polysaccharide-23 12/11/2013, 10/23/2020   Tdap 01/19/2021   Zoster Recombinat (Shingrix) 05/03/2017, 08/08/2017    TDAP status: Up to date  Flu Vaccine status: Up to date  Pneumococcal vaccine status: Up to date  Covid-19 vaccine status: Declined, Education has been provided regarding the importance of this vaccine but patient still declined. Advised may receive this vaccine at local pharmacy or Health Dept.or vaccine clinic. Aware to provide a copy of the vaccination record if obtained from local pharmacy or Health Dept. Verbalized acceptance and understanding.  Qualifies for Shingles Vaccine? Yes   Zostavax completed Yes   Shingrix Completed?: Yes  Screening Tests Health Maintenance  Topic Date Due   COVID-19 Vaccine (1) Never done   OPHTHALMOLOGY EXAM  12/19/2021   INFLUENZA VACCINE  01/26/2022   URINE MICROALBUMIN  04/27/2022   HEMOGLOBIN A1C  04/28/2022   Fecal DNA (Cologuard)  09/04/2022   FOOT EXAM  10/27/2022   TETANUS/TDAP  01/20/2031   Pneumonia Vaccine 32+ Years old  Completed   Hepatitis C Screening  Completed  Zoster Vaccines- Shingrix  Completed   HPV VACCINES  Aged Out   COLONOSCOPY (Pts 45-77yr Insurance coverage will need to be confirmed)  Discontinued    Health Maintenance  Health Maintenance Due  Topic Date Due   COVID-19 Vaccine (1) Never done   OPHTHALMOLOGY EXAM  12/19/2021    Colorectal cancer screening: Type of screening: Cologuard. Completed 09/04/2019. Repeat every 3 years  Lung Cancer Screening: (Low Dose CT Chest recommended if Age 69-80years, 30 pack-year currently smoking OR have quit w/in 15years.) does not qualify.   Additional Screening:  Hepatitis C Screening: does qualify; Completed 07/24/2015  Vision Screening: Recommended annual ophthalmology exams for early detection of glaucoma and other disorders of the eye. Is the patient up to date with their annual eye exam?  No  Who  is the provider or what is the name of the office in which the patient attends annual eye exams[place] If pt is not established with a provider, would they like to be referred to a provider to establish care? No .   Dental Screening: Recommended annual dental exams for proper oral hygiene  Community Resource Referral / Chronic Care Management: CRR required this visit?  No   CCM required this visit?  No      Plan:     I have personally reviewed and noted the following in the patient's chart:   Medical and social history Use of alcohol, tobacco or illicit drugs  Current medications and supplements including opioid prescriptions. Patient is not currently taking opioid prescriptions. Functional ability and status Nutritional status Physical activity Advanced directives List of other physicians Hospitalizations, surgeries, and ER visits in previous 12 months Vitals Screenings to include cognitive, depression, and falls Referrals and appointments  In addition, I have reviewed and discussed with patient certain preventive protocols, quality metrics, and best practice recommendations. A written personalized care plan for preventive services as well as general preventive health recommendations were provided to patient.     ASandrea Hammond LPN   [id number]  Nurse Notes: None

## 2022-01-22 NOTE — Patient Instructions (Signed)
Arthur Torres , Thank you for taking time to come for your Medicare Wellness Visit. I appreciate your ongoing commitment to your health goals. Please review the following plan we discussed and let me know if I can assist you in the future.   Screening recommendations/referrals: Colonoscopy: Cologuard done 09/04/2019 - repeat in 3 years Recommended yearly ophthalmology/optometry visit for glaucoma screening and checkup Recommended yearly dental visit for hygiene and checkup  Vaccinations: Influenza vaccine: Done 05/12/2021 - Repeat annually  Pneumococcal vaccine: Done 08/21/2019 & 10/23/2020  Tdap vaccine: Done 01/19/2021 - Repeat in 10 years  Shingles vaccine: Done 05/03/2017 & 08/08/2017   Covid-19: Declined  Advanced directives: Please bring a copy of your health care power of attorney and living will to the office to be added to your chart at your convenience.   Conditions/risks identified: Aim for 30 minutes of exercise or brisk walking, 6-8 glasses of water, and 5 servings of fruits and vegetables each day.   Next appointment: Follow up in one year for your annual wellness visit.   Preventive Care 69 Years and Older, Male  Preventive care refers to lifestyle choices and visits with your health care provider that can promote health and wellness. What does preventive care include? A yearly physical exam. This is also called an annual well check. Dental exams once or twice a year. Routine eye exams. Ask your health care provider how often you should have your eyes checked. Personal lifestyle choices, including: Daily care of your teeth and gums. Regular physical activity. Eating a healthy diet. Avoiding tobacco and drug use. Limiting alcohol use. Practicing safe sex. Taking low doses of aspirin every day. Taking vitamin and mineral supplements as recommended by your health care provider. What happens during an annual well check? The services and screenings done by your health care  provider during your annual well check will depend on your age, overall health, lifestyle risk factors, and family history of disease. Counseling  Your health care provider may ask you questions about your: Alcohol use. Tobacco use. Drug use. Emotional well-being. Home and relationship well-being. Sexual activity. Eating habits. History of falls. Memory and ability to understand (cognition). Work and work Statistician. Screening  You may have the following tests or measurements: Height, weight, and BMI. Blood pressure. Lipid and cholesterol levels. These may be checked every 5 years, or more frequently if you are over 25 years old. Skin check. Lung cancer screening. You may have this screening every year starting at age 69 if you have a 30-pack-year history of smoking and currently smoke or have quit within the past 15 years. Fecal occult blood test (FOBT) of the stool. You may have this test every year starting at age 69. Flexible sigmoidoscopy or colonoscopy. You may have a sigmoidoscopy every 5 years or a colonoscopy every 10 years starting at age 69. Prostate cancer screening. Recommendations will vary depending on your family history and other risks. Hepatitis C blood test. Hepatitis B blood test. Sexually transmitted disease (STD) testing. Diabetes screening. This is done by checking your blood sugar (glucose) after you have not eaten for a while (fasting). You may have this done every 1-3 years. Abdominal aortic aneurysm (AAA) screening. You may need this if you are a current or former smoker. Osteoporosis. You may be screened starting at age 54 if you are at high risk. Talk with your health care provider about your test results, treatment options, and if necessary, the need for more tests. Vaccines  Your health care  provider may recommend certain vaccines, such as: Influenza vaccine. This is recommended every year. Tetanus, diphtheria, and acellular pertussis (Tdap, Td)  vaccine. You may need a Td booster every 10 years. Zoster vaccine. You may need this after age 69. Pneumococcal 13-valent conjugate (PCV13) vaccine. One dose is recommended after age 69. Pneumococcal polysaccharide (PPSV23) vaccine. One dose is recommended after age 69. Talk to your health care provider about which screenings and vaccines you need and how often you need them. This information is not intended to replace advice given to you by your health care provider. Make sure you discuss any questions you have with your health care provider. Document Released: 07/11/2015 Document Revised: 03/03/2016 Document Reviewed: 04/15/2015 Elsevier Interactive Patient Education  2017 [place] Prevention in the Home Falls can cause injuries. They can happen to people of all ages. There are many things you can do to make your home safe and to help prevent falls. What can I do on the outside of my home? Regularly fix the edges of walkways and driveways and fix any cracks. Remove anything that might make you trip as you walk through a door, such as a raised step or threshold. Trim any bushes or trees on the path to your home. Use bright outdoor lighting. Clear any walking paths of anything that might make someone trip, such as rocks or tools. Regularly check to see if handrails are loose or broken. Make sure that both sides of any steps have handrails. Any raised decks and porches should have guardrails on the edges. Have any leaves, snow, or ice cleared regularly. Use sand or salt on walking paths during winter. Clean up any spills in your garage right away. This includes oil or grease spills. What can I do in the bathroom? Use night lights. Install grab bars by the toilet and in the tub and shower. Do not use towel bars as grab bars. Use non-skid mats or decals in the tub or shower. If you need to sit down in the shower, use a plastic, non-slip stool. Keep the floor dry. Clean up any  water that spills on the floor as soon as it happens. Remove soap buildup in the tub or shower regularly. Attach bath mats securely with double-sided non-slip rug tape. Do not have throw rugs and other things on the floor that can make you trip. What can I do in the bedroom? Use night lights. Make sure that you have a light by your bed that is easy to reach. Do not use any sheets or blankets that are too big for your bed. They should not hang down onto the floor. Have a firm chair that has side arms. You can use this for support while you get dressed. Do not have throw rugs and other things on the floor that can make you trip. What can I do in the kitchen? Clean up any spills right away. Avoid walking on wet floors. Keep items that you use a lot in easy-to-reach places. If you need to reach something above you, use a strong step stool that has a grab bar. Keep electrical cords out of the way. Do not use floor polish or wax that makes floors slippery. If you must use wax, use non-skid floor wax. Do not have throw rugs and other things on the floor that can make you trip. What can I do with my stairs? Do not leave any items on the stairs. Make sure that there are handrails on both  sides of the stairs and use them. Fix handrails that are broken or loose. Make sure that handrails are as long as the stairways. Check any carpeting to make sure that it is firmly attached to the stairs. Fix any carpet that is loose or worn. Avoid having throw rugs at the top or bottom of the stairs. If you do have throw rugs, attach them to the floor with carpet tape. Make sure that you have a light switch at the top of the stairs and the bottom of the stairs. If you do not have them, ask someone to add them for you. What else can I do to help prevent falls? Wear shoes that: Do not have high heels. Have rubber bottoms. Are comfortable and fit you well. Are closed at the toe. Do not wear sandals. If you use a  stepladder: Make sure that it is fully opened. Do not climb a closed stepladder. Make sure that both sides of the stepladder are locked into place. Ask someone to hold it for you, if possible. Clearly mark and make sure that you can see: Any grab bars or handrails. First and last steps. Where the edge of each step is. Use tools that help you move around (mobility aids) if they are needed. These include: Canes. Walkers. Scooters. Crutches. Turn on the lights when you go into a dark area. Replace any light bulbs as soon as they burn out. Set up your furniture so you have a clear path. Avoid moving your furniture around. If any of your floors are uneven, fix them. If there are any pets around you, be aware of where they are. Review your medicines with your doctor. Some medicines can make you feel dizzy. This can increase your chance of falling. Ask your doctor what other things that you can do to help prevent falls. This information is not intended to replace advice given to you by your health care provider. Make sure you discuss any questions you have with your health care provider. Document Released: 04/10/2009 Document Revised: 11/20/2015 Document Reviewed: 07/19/2014 Elsevier Interactive Patient Education  2017 [employer].

## 2022-01-26 ENCOUNTER — Ambulatory Visit (INDEPENDENT_AMBULATORY_CARE_PROVIDER_SITE_OTHER): Payer: Medicare HMO | Admitting: Nurse Practitioner

## 2022-01-26 ENCOUNTER — Encounter: Payer: Self-pay | Admitting: Nurse Practitioner

## 2022-01-26 VITALS — BP 126/78 | HR 98 | Temp 97.9°F | Resp 20 | Ht 70.0 in | Wt 187.0 lb

## 2022-01-26 DIAGNOSIS — Z6827 Body mass index (BMI) 27.0-27.9, adult: Secondary | ICD-10-CM | POA: Diagnosis not present

## 2022-01-26 DIAGNOSIS — E785 Hyperlipidemia, unspecified: Secondary | ICD-10-CM

## 2022-01-26 DIAGNOSIS — K2900 Acute gastritis without bleeding: Secondary | ICD-10-CM | POA: Diagnosis not present

## 2022-01-26 DIAGNOSIS — I10 Essential (primary) hypertension: Secondary | ICD-10-CM

## 2022-01-26 DIAGNOSIS — Z955 Presence of coronary angioplasty implant and graft: Secondary | ICD-10-CM

## 2022-01-26 DIAGNOSIS — Z8546 Personal history of malignant neoplasm of prostate: Secondary | ICD-10-CM

## 2022-01-26 DIAGNOSIS — E119 Type 2 diabetes mellitus without complications: Secondary | ICD-10-CM | POA: Diagnosis not present

## 2022-01-26 LAB — BAYER DCA HB A1C WAIVED: HB A1C (BAYER DCA - WAIVED): 8 % — ABNORMAL HIGH (ref 4.8–5.6)

## 2022-01-26 MED ORDER — JANUMET 50-500 MG PO TABS: 1.0000 | ORAL_TABLET | Freq: Two times a day (BID) | ORAL | 1 refills | Status: DC

## 2022-01-26 MED ORDER — PANTOPRAZOLE SODIUM 40 MG PO TBEC: 40.0000 mg | DELAYED_RELEASE_TABLET | Freq: Every day | ORAL | 1 refills | Status: DC

## 2022-01-26 MED ORDER — METOPROLOL SUCCINATE ER 50 MG PO TB24: 50.0000 mg | ORAL_TABLET | Freq: Every day | ORAL | 1 refills | Status: DC

## 2022-01-26 MED ORDER — ROSUVASTATIN CALCIUM 10 MG PO TABS: 10.0000 mg | ORAL_TABLET | Freq: Every day | ORAL | 1 refills | Status: DC

## 2022-01-26 NOTE — Patient Instructions (Signed)

## 2022-01-26 NOTE — Progress Notes (Signed)
Subjective:    Patient ID: Arthur Torres, male    DOB: 02/03/1951, 71 y.o.   MRN: [id number]   Chief Complaint: medical management of chronic issues     HPI:  Arthur Torres is a 71 y.o. who identifies as a male who was assigned male at birth.   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 Essential hypertension, benign No c/o chest pain, sob or headache. Does not check blood pressure at home. BP Readings from Last 3 Encounters:  10/26/21 120/70  07/28/21 (!) 145/80  04/27/21 116/65     2. Hyperlipidemia with target LDL less than 100 Does not really watch diet very closely unless his wife gets on him about it. Lab Results  Component Value Date   CHOL 119 10/26/2021   HDL 36 (L) 10/26/2021   LDLCALC 53 10/26/2021   TRIG 179 (H) 10/26/2021   CHOLHDL 3.3 10/26/2021   The ASCVD Risk score (Arnett DK, et al., 2019) failed to calculate for the following reasons:   The patient has a prior MI or stroke diagnosis   3. Type 2 diabetes mellitus without complication, without long-term current use of insulin ([place]) He doesn ot check his blod sugars at home. No symptoms of low blood sugars. Lab Results  Component Value Date   HGBA1C 7.2 (H) 10/26/2021     4. History of right coronary artery stent placement He has not follow  up with cardiology in several years. Says he is doing well.  5. H/O prostate cancer No issues voiding Lab Results  Component Value Date   PSA1 <0.1 07/28/2021   PSA1 0.1 08/21/2019   PSA1 <0.1 08/08/2017   PSA 0.2 09/03/2013      6. BMI 27.0-27.9,adult Weight is down 5lbs Wt Readings from Last 3 Encounters:  01/26/22 187 lb (84.8 kg)  01/22/22 192 lb (87.1 kg)  10/26/21 192 lb (87.1 kg)   BMI Readings from Last 3 Encounters:  01/26/22 26.83 kg/m  01/22/22 27.55 kg/m  10/26/21 27.55 kg/m     New complaints: Pressure pain in upper  abdomen and no regular bowel movements. Has diarrhea and occasional constipation.  Allergies  Allergen Reactions   Penicillins    Zocor [Simvastatin]    Outpatient Encounter Medications as of 01/26/2022  Medication Sig   aspirin 325 MG EC tablet Take 325 mg by mouth daily.   metFORMIN (GLUCOPHAGE) 1000 MG tablet TAKE 1 TABLET (1,000 MG TOTAL) BY MOUTH TWICE A DAY WITH FOOD   metoprolol succinate (TOPROL-XL) 50 MG 24 hr tablet Take 1 tablet (50 mg total) by mouth daily. Take with or immediately following a meal.   pantoprazole (PROTONIX) 40 MG tablet Take 1 tablet (40 mg total) by mouth daily.   rosuvastatin (CRESTOR) 10 MG tablet Take 1 tablet (10 mg total) by mouth daily.   No facility-administered encounter medications on file as of 01/26/2022.    Past Surgical History:  Procedure Laterality Date   CARDIAC CATHETERIZATION     COLON SURGERY     hemrrhoids   CORONARY ANGIOPLASTY WITH STENT PLACEMENT      Family History  Problem Relation Age of Onset   Diabetes Mother    Stroke Mother    Cancer Father        skin, prostate, stomach   Healthy Daughter    Healthy Son    Asthma Son  Controlled substance contract: n/a     Review of Systems  Constitutional:  Negative for diaphoresis.  Eyes:  Negative for pain.  Respiratory:  Negative for shortness of breath.   Cardiovascular:  Negative for chest pain, palpitations and leg swelling.  Gastrointestinal:  Negative for abdominal pain.  Endocrine: Negative for polydipsia.  Skin:  Negative for rash.  Neurological:  Negative for dizziness, weakness and headaches.  Hematological:  Does not bruise/bleed easily.  All other systems reviewed and are negative.      Objective:   Physical Exam Vitals and nursing note reviewed.  Constitutional:      Appearance: Normal appearance. He is well-developed.  HENT:     Head: Normocephalic.     Nose: Nose normal.     Mouth/Throat:     Mouth: Mucous membranes are moist.      Pharynx: Oropharynx is clear.  Eyes:     Pupils: Pupils are equal, round, and reactive to light.  Neck:     Thyroid: No thyroid mass or thyromegaly.     Vascular: No carotid bruit or JVD.     Trachea: Phonation normal.  Cardiovascular:     Rate and Rhythm: Normal rate and regular rhythm.  Pulmonary:     Effort: Pulmonary effort is normal. No respiratory distress.     Breath sounds: Normal breath sounds.  Abdominal:     General: Bowel sounds are normal.     Palpations: Abdomen is soft.     Tenderness: There is no abdominal tenderness.  Musculoskeletal:        General: Normal range of motion.     Cervical back: Normal range of motion and neck supple.  Lymphadenopathy:     Cervical: No cervical adenopathy.  Skin:    General: Skin is warm and dry.  Neurological:     Mental Status: He is alert and oriented to person, place, and time.  Psychiatric:        Behavior: Behavior normal.        Thought Content: Thought content normal.        Judgment: Judgment normal.    BP 126/78   Pulse 98   Temp 97.9 F (36.6 C) (Temporal)   Resp 20   Ht 5' 10"  (1.778 m)   Wt 187 lb (84.8 kg)   SpO2 98%   BMI 26.83 kg/m   Hgba1c 8.0%      Assessment & Plan:   [name] in today with chief complaint of Medical Management of Chronic Issues (Diarrhea at times)   1. Essential hypertension, benign Low sodium diet - CBC with Differential/Platelet - CMP14+EGFR - metoprolol succinate (TOPROL-XL) 50 MG 24 hr tablet; Take 1 tablet (50 mg total) by mouth daily. Take with or immediately following a meal.  Dispense: 90 tablet; Refill: 1  2. Hyperlipidemia with target LDL less than 100 Low fat diet - Lipid panel - rosuvastatin (CRESTOR) 10 MG tablet; Take 1 tablet (10 mg total) by mouth daily.  Dispense: 90 tablet; Refill: 1  3. Type 2 diabetes mellitus without complication, without long-term current use of insulin (HCC) Back on janumet 50 500 bid - Bayer DCA Hb A1c Waived -  sitaGLIPtin-metformin (JANUMET) 50-500 MG tablet; Take 1 tablet by mouth 2 (two) times daily with a meal.  Dispense: 90 tablet; Refill: 1  4. History of right coronary artery stent placement Will see cardiology as needed  5. H/O prostate cancer . 6. BMI 27.0-27.9,adult Discussed diet and exercise for person with  BMI >25 Will recheck weight in 3-6 months  7. Acute gastritis without hemorrhage, unspecified gastritis type Will do gallbladder U/s First 24 Hours-Clear liquids  popsicles  Jello  gatorade  Sprite Second 24 hours-Add Full liquids ( Liquids you cant see through) Third 24 hours- Bland diet ( foods that are baked or broiled)  *avoiding fried foods and highly spiced foods* During these 3 days  Avoid milk, cheese, ice cream or any other dairy products  Avoid caffeine- REMEMBER Mt. Dew and Mello Yellow contain lots of caffeine You should eat and drink in  Frequent small volumes If no improvement in symptoms or worsen in 2-3 days should RETRUN TO OFFICE or go to ER!    - pantoprazole (PROTONIX) 40 MG tablet; Take 1 tablet (40 mg total) by mouth daily.  Dispense: 90 tablet; Refill: 1    The above assessment and management plan was discussed with the patient. The patient verbalized understanding of and has agreed to the management plan. Patient is aware to call the clinic if symptoms persist or worsen. Patient is aware when to return to the clinic for a follow-up visit. Patient educated on when it is appropriate to go to the emergency department.   [name] Done, FNP

## 2022-01-27 LAB — CMP14+EGFR
ALT: 27 IU/L (ref 0–44)
AST: 18 IU/L (ref 0–40)
Albumin/Globulin Ratio: 1.8 (ref 1.2–2.2)
Albumin: 4.4 g/dL (ref 3.9–4.9)
Alkaline Phosphatase: 70 IU/L (ref 44–121)
BUN/Creatinine Ratio: 20 (ref 10–24)
BUN: 20 mg/dL (ref 8–27)
Bilirubin Total: 0.4 mg/dL (ref 0.0–1.2)
CO2: 23 mmol/L (ref 20–29)
Calcium: 9.4 mg/dL (ref 8.6–10.2)
Chloride: 102 mmol/L (ref 96–106)
Creatinine, Ser: 0.98 mg/dL (ref 0.76–1.27)
Globulin, Total: 2.5 g/dL (ref 1.5–4.5)
Glucose: 202 mg/dL — ABNORMAL HIGH (ref 70–99)
Potassium: 4.4 mmol/L (ref 3.5–5.2)
Sodium: 139 mmol/L (ref 134–144)
Total Protein: 6.9 g/dL (ref 6.0–8.5)
eGFR: 84 mL/min/{1.73_m2} (ref >59)

## 2022-01-27 LAB — LIPID PANEL
Chol/HDL Ratio: 2.9 ratio (ref 0.0–5.0)
Cholesterol, Total: 109 mg/dL (ref 100–199)
HDL: 38 mg/dL — ABNORMAL LOW (ref >39)
LDL Chol Calc (NIH): 47 mg/dL (ref 0–99)
Triglycerides: 140 mg/dL (ref 0–149)
VLDL Cholesterol Cal: 24 mg/dL (ref 5–40)

## 2022-01-27 LAB — CBC WITH DIFFERENTIAL/PLATELET
Basophils Absolute: 0.1 10*3/uL (ref 0.0–0.2)
Basos: 1 %
EOS (ABSOLUTE): 0.2 10*3/uL (ref 0.0–0.4)
Eos: 3 %
Hematocrit: 42.9 % (ref 37.5–51.0)
Hemoglobin: 14.4 g/dL (ref 13.0–17.7)
Immature Grans (Abs): 0 10*3/uL (ref 0.0–0.1)
Immature Granulocytes: 0 %
Lymphocytes Absolute: 2 10*3/uL (ref 0.7–3.1)
Lymphs: 33 %
MCH: 29.1 pg (ref 26.6–33.0)
MCHC: 33.6 g/dL (ref 31.5–35.7)
MCV: 87 fL (ref 79–97)
Monocytes Absolute: 0.5 10*3/uL (ref 0.1–0.9)
Monocytes: 7 %
Neutrophils Absolute: 3.4 10*3/uL (ref 1.4–7.0)
Neutrophils: 56 %
Platelets: 162 10*3/uL (ref 150–450)
RBC: 4.95 x10E6/uL (ref 4.14–5.80)
RDW: 13.1 % (ref 11.6–15.4)
WBC: 6.2 10*3/uL (ref 3.4–10.8)

## 2022-02-05 ENCOUNTER — Other Ambulatory Visit (HOSPITAL_COMMUNITY): Payer: Medicare HMO

## 2022-05-02 ENCOUNTER — Other Ambulatory Visit: Payer: Self-pay | Admitting: Nurse Practitioner

## 2022-05-02 DIAGNOSIS — E119 Type 2 diabetes mellitus without complications: Secondary | ICD-10-CM

## 2022-05-08 ENCOUNTER — Other Ambulatory Visit: Payer: Self-pay | Admitting: Nurse Practitioner

## 2022-05-08 DIAGNOSIS — E119 Type 2 diabetes mellitus without complications: Secondary | ICD-10-CM

## 2022-05-27 ENCOUNTER — Encounter: Payer: Self-pay | Admitting: Nurse Practitioner

## 2022-05-27 ENCOUNTER — Ambulatory Visit (INDEPENDENT_AMBULATORY_CARE_PROVIDER_SITE_OTHER): Payer: Medicare HMO | Admitting: Nurse Practitioner

## 2022-05-27 VITALS — BP 120/65 | HR 63 | Temp 98.4°F | Resp 20 | Ht 70.0 in | Wt 188.0 lb

## 2022-05-27 DIAGNOSIS — I10 Essential (primary) hypertension: Secondary | ICD-10-CM

## 2022-05-27 DIAGNOSIS — E119 Type 2 diabetes mellitus without complications: Secondary | ICD-10-CM

## 2022-05-27 DIAGNOSIS — E785 Hyperlipidemia, unspecified: Secondary | ICD-10-CM | POA: Diagnosis not present

## 2022-05-27 DIAGNOSIS — Z6827 Body mass index (BMI) 27.0-27.9, adult: Secondary | ICD-10-CM

## 2022-05-27 DIAGNOSIS — K2900 Acute gastritis without bleeding: Secondary | ICD-10-CM | POA: Diagnosis not present

## 2022-05-27 DIAGNOSIS — Z23 Encounter for immunization: Secondary | ICD-10-CM | POA: Diagnosis not present

## 2022-05-27 DIAGNOSIS — Z8546 Personal history of malignant neoplasm of prostate: Secondary | ICD-10-CM | POA: Diagnosis not present

## 2022-05-27 DIAGNOSIS — Z955 Presence of coronary angioplasty implant and graft: Secondary | ICD-10-CM

## 2022-05-27 LAB — BAYER DCA HB A1C WAIVED: HB A1C (BAYER DCA - WAIVED): 7.6 % — ABNORMAL HIGH (ref 4.8–5.6)

## 2022-05-27 MED ORDER — METOPROLOL SUCCINATE ER 50 MG PO TB24: 50.0000 mg | ORAL_TABLET | Freq: Every day | ORAL | 1 refills | Status: DC

## 2022-05-27 MED ORDER — JANUMET 50-500 MG PO TABS: 1.0000 | ORAL_TABLET | Freq: Two times a day (BID) | ORAL | 1 refills | Status: DC

## 2022-05-27 MED ORDER — ROSUVASTATIN CALCIUM 10 MG PO TABS: 10.0000 mg | ORAL_TABLET | Freq: Every day | ORAL | 1 refills | Status: DC

## 2022-05-27 MED ORDER — PANTOPRAZOLE SODIUM 40 MG PO TBEC: 40.0000 mg | DELAYED_RELEASE_TABLET | Freq: Every day | ORAL | 1 refills | Status: DC

## 2022-05-27 NOTE — Patient Instructions (Signed)

## 2022-05-27 NOTE — Progress Notes (Signed)
Subjective:    Patient ID: Arthur Torres, male    DOB: 1951-02-07, 71 y.o.   MRN: [id number]   Chief Complaint: medical management of chronic issues     HPI:  Arthur Torres is a 71 y.o. who identifies as a male who was assigned male at birth.   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 Essential hypertension, benign No c/o chest pain, sob or headache. Does not check blood pressure at home. BP Readings from Last 3 Encounters:  01/26/22 126/78  10/26/21 120/70  07/28/21 (!) 145/80     2. History of right coronary artery stent placement Last saw cardiology several years ago.  3. Hyperlipidemia with target LDL less than 100 Does not really watch his diet and does no dedicated exercise. Lab Results  Component Value Date   CHOL 109 01/26/2022   HDL 38 (L) 01/26/2022   LDLCALC 47 01/26/2022   TRIG 140 01/26/2022   CHOLHDL 2.9 01/26/2022    4. Type 2 diabetes mellitus without complication, without long-term current use of insulin (Arthur Torres) Patient does  not check his blood sugars. Lab Results  Component Value Date   HGBA1C 8.0 (H) 01/26/2022    5. H/O prostate cancer No voiding issues Lab Results  Component Value Date   PSA1 <0.1 07/28/2021   PSA1 0.1 08/21/2019   PSA1 <0.1 08/08/2017   PSA 0.2 09/03/2013      6. BMI 27.0-27.9,adult No recent weight changes Wt Readings from Last 3 Encounters:  05/27/22 188 lb (85.3 kg)  01/26/22 187 lb (84.8 kg)  01/22/22 192 lb (87.1 kg)   BMI Readings from Last 3 Encounters:  05/27/22 26.98 kg/m  01/26/22 26.83 kg/m  01/22/22 27.55 kg/m     New complaints: None today  Allergies  Allergen Reactions   Penicillins    Zocor [Simvastatin]    Outpatient Encounter Medications as of 05/27/2022  Medication Sig   aspirin 325 MG EC tablet Take 325 mg by mouth daily.   metFORMIN (GLUCOPHAGE) 1000 MG tablet TAKE 1  TABLET (1,000 MG TOTAL) BY MOUTH TWICE A DAY WITH FOOD   metoprolol succinate (TOPROL-XL) 50 MG 24 hr tablet Take 1 tablet (50 mg total) by mouth daily. Take with or immediately following a meal.   pantoprazole (PROTONIX) 40 MG tablet Take 1 tablet (40 mg total) by mouth daily.   rosuvastatin (CRESTOR) 10 MG tablet Take 1 tablet (10 mg total) by mouth daily.   sitaGLIPtin-metformin (JANUMET) 50-500 MG tablet Take 1 tablet by mouth 2 (two) times daily with a meal.   No facility-administered encounter medications on file as of 05/27/2022.    Past Surgical History:  Procedure Laterality Date   CARDIAC CATHETERIZATION     COLON SURGERY     hemrrhoids   CORONARY ANGIOPLASTY WITH STENT PLACEMENT      Family History  Problem Relation Age of Onset   Diabetes Mother    Stroke Mother    Cancer Father        skin, prostate, stomach   Healthy Daughter    Healthy Son    Asthma Son       Controlled substance contract: n/a     Review of Systems  Constitutional:  Negative for diaphoresis.  Eyes:  Negative for pain.  Respiratory:  Negative for shortness of breath.   Cardiovascular:  Negative for chest pain, palpitations and  leg swelling.  Gastrointestinal:  Negative for abdominal pain.  Endocrine: Negative for polydipsia.  Skin:  Negative for rash.  Neurological:  Negative for dizziness, weakness and headaches.  Hematological:  Does not bruise/bleed easily.  All other systems reviewed and are negative.      Objective:   Physical Exam Vitals and nursing note reviewed.  Constitutional:      Appearance: Normal appearance. He is well-developed.  Neck:     Thyroid: No thyroid mass or thyromegaly.     Vascular: No carotid bruit or JVD.     Trachea: Phonation normal.  Cardiovascular:     Rate and Rhythm: Normal rate and regular rhythm.  Pulmonary:     Effort: Pulmonary effort is normal. No respiratory distress.     Breath sounds: Normal breath sounds.  Abdominal:     General:  Bowel sounds are normal.     Palpations: Abdomen is soft.     Tenderness: There is no abdominal tenderness.  Musculoskeletal:        General: Normal range of motion.     Cervical back: Normal range of motion and neck supple.  Lymphadenopathy:     Cervical: No cervical adenopathy.  Skin:    General: Skin is warm and dry.  Neurological:     Mental Status: He is alert and oriented to person, place, and time.  Psychiatric:        Behavior: Behavior normal.        Thought Content: Thought content normal.        Judgment: Judgment normal.    BP 120/65   Pulse 63   Temp 98.4 F (36.9 C) (Temporal)   Resp 20   Ht _0  (1.778 m)   Wt 188 lb (85.3 kg)   SpO2 97%   BMI 26.98 kg/m   Hgba1c 7.6%       Assessment & Plan:   Arthur Torres comes in today with chief complaint of Medical Management of Chronic Issues   Diagnosis and orders addressed:  1. Essential hypertension, benign Low sodium diet - CBC with Differential/Platelet - CMP14+EGFR - metoprolol succinate (TOPROL-XL) 50 MG 24 hr tablet; Take 1 tablet (50 mg total) by mouth daily. Take with or immediately following a meal.  Dispense: 90 tablet; Refill: 1  2. History of right coronary artery stent placement The last time he saw cardiology- they told him he did not need to be seen again unless he was having issues  3. Hyperlipidemia with target LDL less than 100 Low fat diet - Lipid panel - rosuvastatin (CRESTOR) 10 MG tablet; Take 1 tablet (10 mg total) by mouth daily.  Dispense: 90 tablet; Refill: 1  4. Type 2 diabetes mellitus without complication, without long-term current use of insulin (HCC) Continue to watch carbs in diet - Bayer DCA Hb A1c Waived - Microalbumin / creatinine urine ratio - sitaGLIPtin-metformin (JANUMET) 50-500 MG tablet; Take 1 tablet by mouth 2 (two) times daily with a meal.  Dispense: 90 tablet; Refill: 1  5. H/O prostate cancer Psa not need to be checked today  6. BMI  27.0-27.9,adult Discussed diet and exercise for person with BMI >25 Will recheck weight in 3-6 months   7. Acute gastritis without hemorrhage, unspecified gastritis type Avoid spicy foods Do not eat 2 hours prior to bedtime  - pantoprazole (PROTONIX) 40 MG tablet; Take 1 tablet (40 mg total) by mouth daily.  Dispense: 90 tablet; Refill: 1   Labs pending Health Maintenance reviewed Diet  and exercise encouraged  Follow up plan: 3 months   [name] Done, FNP

## 2022-05-28 LAB — LIPID PANEL
Chol/HDL Ratio: 3 ratio (ref 0.0–5.0)
Cholesterol, Total: 127 mg/dL (ref 100–199)
HDL: 43 mg/dL (ref >39)
LDL Chol Calc (NIH): 53 mg/dL (ref 0–99)
Triglycerides: 186 mg/dL — ABNORMAL HIGH (ref 0–149)
VLDL Cholesterol Cal: 31 mg/dL (ref 5–40)

## 2022-05-28 LAB — CBC WITH DIFFERENTIAL/PLATELET
Basophils Absolute: 0.1 10*3/uL (ref 0.0–0.2)
Basos: 1 %
EOS (ABSOLUTE): 0.3 10*3/uL (ref 0.0–0.4)
Eos: 4 %
Hematocrit: 44.6 % (ref 37.5–51.0)
Hemoglobin: 14.9 g/dL (ref 13.0–17.7)
Immature Grans (Abs): 0.1 10*3/uL (ref 0.0–0.1)
Immature Granulocytes: 1 %
Lymphocytes Absolute: 2.4 10*3/uL (ref 0.7–3.1)
Lymphs: 37 %
MCH: 28.6 pg (ref 26.6–33.0)
MCHC: 33.4 g/dL (ref 31.5–35.7)
MCV: 86 fL (ref 79–97)
Monocytes Absolute: 0.5 10*3/uL (ref 0.1–0.9)
Monocytes: 7 %
Neutrophils Absolute: 3.2 10*3/uL (ref 1.4–7.0)
Neutrophils: 50 %
Platelets: 152 10*3/uL (ref 150–450)
RBC: 5.21 x10E6/uL (ref 4.14–5.80)
RDW: 12.9 % (ref 11.6–15.4)
WBC: 6.5 10*3/uL (ref 3.4–10.8)

## 2022-05-28 LAB — CMP14+EGFR
ALT: 17 IU/L (ref 0–44)
AST: 16 IU/L (ref 0–40)
Albumin/Globulin Ratio: 1.9 (ref 1.2–2.2)
Albumin: 4.5 g/dL (ref 3.9–4.9)
Alkaline Phosphatase: 66 IU/L (ref 44–121)
BUN/Creatinine Ratio: 17 (ref 10–24)
BUN: 18 mg/dL (ref 8–27)
Bilirubin Total: 0.3 mg/dL (ref 0.0–1.2)
CO2: 25 mmol/L (ref 20–29)
Calcium: 9.7 mg/dL (ref 8.6–10.2)
Chloride: 101 mmol/L (ref 96–106)
Creatinine, Ser: 1.03 mg/dL (ref 0.76–1.27)
Globulin, Total: 2.4 g/dL (ref 1.5–4.5)
Glucose: 187 mg/dL — ABNORMAL HIGH (ref 70–99)
Potassium: 4.4 mmol/L (ref 3.5–5.2)
Sodium: 139 mmol/L (ref 134–144)
Total Protein: 6.9 g/dL (ref 6.0–8.5)
eGFR: 79 mL/min/{1.73_m2} (ref >59)

## 2022-05-29 LAB — MICROALBUMIN / CREATININE URINE RATIO
Creatinine, Urine: 57.8 mg/dL
Microalb/Creat Ratio: <5 mg/g creat (ref 0–29)
Microalbumin, Urine: <3 ug/mL

## 2022-08-26 ENCOUNTER — Other Ambulatory Visit: Payer: Self-pay | Admitting: Nurse Practitioner

## 2022-08-26 ENCOUNTER — Encounter: Payer: Self-pay | Admitting: Radiology

## 2022-08-26 DIAGNOSIS — E119 Type 2 diabetes mellitus without complications: Secondary | ICD-10-CM

## 2022-08-27 ENCOUNTER — Ambulatory Visit (INDEPENDENT_AMBULATORY_CARE_PROVIDER_SITE_OTHER): Payer: Medicare HMO | Admitting: Nurse Practitioner

## 2022-08-27 ENCOUNTER — Encounter: Payer: Self-pay | Admitting: Nurse Practitioner

## 2022-08-27 VITALS — BP 142/81 | HR 72 | Temp 97.1°F | Resp 20 | Ht 70.0 in | Wt 191.0 lb

## 2022-08-27 DIAGNOSIS — E119 Type 2 diabetes mellitus without complications: Secondary | ICD-10-CM

## 2022-08-27 DIAGNOSIS — I2583 Coronary atherosclerosis due to lipid rich plaque: Secondary | ICD-10-CM

## 2022-08-27 DIAGNOSIS — Z6827 Body mass index (BMI) 27.0-27.9, adult: Secondary | ICD-10-CM | POA: Diagnosis not present

## 2022-08-27 DIAGNOSIS — I10 Essential (primary) hypertension: Secondary | ICD-10-CM

## 2022-08-27 DIAGNOSIS — Z8546 Personal history of malignant neoplasm of prostate: Secondary | ICD-10-CM | POA: Diagnosis not present

## 2022-08-27 DIAGNOSIS — E785 Hyperlipidemia, unspecified: Secondary | ICD-10-CM | POA: Diagnosis not present

## 2022-08-27 DIAGNOSIS — Z0001 Encounter for general adult medical examination with abnormal findings: Secondary | ICD-10-CM

## 2022-08-27 DIAGNOSIS — I2510 Atherosclerotic heart disease of native coronary artery without angina pectoris: Secondary | ICD-10-CM | POA: Diagnosis not present

## 2022-08-27 DIAGNOSIS — Z0000 Encounter for general adult medical examination without abnormal findings: Secondary | ICD-10-CM

## 2022-08-27 LAB — BAYER DCA HB A1C WAIVED: HB A1C (BAYER DCA - WAIVED): 8.5 % — ABNORMAL HIGH (ref 4.8–5.6)

## 2022-08-27 MED ORDER — JANUMET 50-500 MG PO TABS: 1.0000 | ORAL_TABLET | Freq: Two times a day (BID) | ORAL | 0 refills | Status: DC

## 2022-08-27 MED ORDER — GLIPIZIDE 5 MG PO TABS: 5.0000 mg | ORAL_TABLET | Freq: Two times a day (BID) | ORAL | 3 refills | Status: DC

## 2022-08-27 NOTE — Patient Instructions (Signed)

## 2022-08-27 NOTE — Progress Notes (Signed)
Subjective:    Patient ID: Arthur Torres, male    DOB: 09/20/1950, 72 y.o.   MRN: [id number]   Chief Complaint: annual physical   HPI:  Arthur Torres is a 72 y.o. who identifies as a male who was assigned male at birth.   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 Annual physical exam   2. Essential hypertension, benign No c/o chest pain, sob or headache. Does not check blood pressure athome. BP Readings from Last 3 Encounters:  05/27/22 120/65  01/26/22 126/78  10/26/21 120/70     3. Coronary artery disease due to lipid rich plaque Has not followed  up with cardiology in several years. Says he feels good. No fatigue, no heart palpitations.  4. Hyperlipidemia with target LDL less than 100 Does not really watch diet. Does no dedicated exercise. Lab Results  Component Value Date   CHOL 127 05/27/2022   HDL 43 05/27/2022   LDLCALC 53 05/27/2022   TRIG 186 (H) 05/27/2022   CHOLHDL 3.0 05/27/2022     5. Type 2 diabetes mellitus without complication, without long-term current use of insulin ([place]) He does not check his blood sugars at home.not very compliant with diet. Lab Results  Component Value Date   HGBA1C 7.6 (H) 05/27/2022     6. H/O prostate cancer No voiding issues  7. BMI 27.0-27.9,adult No recent weight changes Wt Readings from Last 3 Encounters:  08/27/22 191 lb (86.6 kg)  05/27/22 188 lb (85.3 kg)  01/26/22 187 lb (84.8 kg)   BMI Readings from Last 3 Encounters:  08/27/22 27.41 kg/m  05/27/22 26.98 kg/m  01/26/22 26.83 kg/m      New complaints: None today  Allergies  Allergen Reactions   Penicillins    Zocor [Simvastatin]    Outpatient Encounter Medications as of 08/27/2022  Medication Sig   aspirin 325 MG EC tablet Take 325 mg by mouth daily.   metoprolol succinate (TOPROL-XL) 50 MG 24 hr tablet Take 1 tablet (50 mg total)  by mouth daily. Take with or immediately following a meal.   pantoprazole (PROTONIX) 40 MG tablet Take 1 tablet (40 mg total) by mouth daily.   rosuvastatin (CRESTOR) 10 MG tablet Take 1 tablet (10 mg total) by mouth daily.   sitaGLIPtin-metformin (JANUMET) 50-500 MG tablet Take 1 tablet by mouth 2 (two) times daily with a meal.   No facility-administered encounter medications on file as of 08/27/2022.    Past Surgical History:  Procedure Laterality Date   CARDIAC CATHETERIZATION     COLON SURGERY     hemrrhoids   CORONARY ANGIOPLASTY WITH STENT PLACEMENT      Family History  Problem Relation Age of Onset   Diabetes Mother    Stroke Mother    Cancer Father        skin, prostate, stomach   Healthy Daughter    Healthy Son    Asthma Son       Controlled substance contract: n/a     Review of Systems  Constitutional:  Negative for diaphoresis.  Eyes:  Negative for pain.  Respiratory:  Negative for shortness of breath.   Cardiovascular:  Negative for chest pain, palpitations and leg swelling.  Gastrointestinal:  Negative for abdominal pain.  Endocrine: Negative for polydipsia.  Skin:  Negative for rash.  Neurological:  Negative for dizziness, weakness and headaches.  Hematological:  Does not bruise/bleed easily.  All other systems reviewed and are negative.      Objective:   Physical Exam Vitals and nursing note reviewed.  Constitutional:      Appearance: Normal appearance. He is well-developed.  HENT:     Head: Normocephalic.     Nose: Nose normal.     Mouth/Throat:     Mouth: Mucous membranes are moist.     Pharynx: Oropharynx is clear.  Eyes:     Pupils: Pupils are equal, round, and reactive to light.  Neck:     Thyroid: No thyroid mass or thyromegaly.     Vascular: No carotid bruit or JVD.     Trachea: Phonation normal.  Cardiovascular:     Rate and Rhythm: Normal rate and regular rhythm.  Pulmonary:     Effort: Pulmonary effort is normal. No  respiratory distress.     Breath sounds: Normal breath sounds.  Abdominal:     General: Bowel sounds are normal.     Palpations: Abdomen is soft.     Tenderness: There is no abdominal tenderness.  Musculoskeletal:        General: Normal range of motion.     Cervical back: Normal range of motion and neck supple.  Lymphadenopathy:     Cervical: No cervical adenopathy.  Skin:    General: Skin is warm and dry.  Neurological:     Mental Status: He is alert and oriented to person, place, and time.  Psychiatric:        Behavior: Behavior normal.        Thought Content: Thought content normal.        Judgment: Judgment normal.    BP (!) 142/81   Pulse 72   Temp (!) 97.1 F (36.2 C) (Temporal)   Resp 20   Ht '5\' 10"'$  (1.778 m)   Wt 191 lb (86.6 kg)   SpO2 97%   BMI 27.41 kg/m   HGA1C 8.5%       Assessment & Plan:  Arthur Torres comes in today with chief complaint of Annual Exam   Diagnosis and orders addressed:  1. Annual physical exam   2. Essential hypertension, benign Low sodium diet - CBC with Differential/Platelet - CMP14+EGFR  3. Coronary artery disease due to lipid rich plaque  4. Hyperlipidemia with target LDL less than 100 Low fat diet - Lipid panel  5. Type 2 diabetes mellitus without complication, without long-term current use of insulin (HCC) Strict low carb diet - Bayer DCA Hb A1c Waived - Ambulatory referral to Podiatry - glipiZIDE (GLUCOTROL) 5 MG tablet; Take 1 tablet (5 mg total) by mouth 2 (two) times daily before a meal.  Dispense: 60 tablet; Refill: 3 - sitaGLIPtin-metformin (JANUMET) 50-500 MG tablet; Take 1 tablet by mouth 2 (two) times daily with a meal.  Dispense: 180 tablet; Refill: 0  6. H/O prostate cancer Report any voiding issues - PSA, total and free  7. BMI 27.0-27.9,adult Discussed diet and exercise for person with BMI >25 Will recheck weight in 3-6 months    Labs pending Health Maintenance reviewed Diet and exercise  encouraged  Follow up plan: 1 month   [place], FNP

## 2022-08-28 LAB — CBC WITH DIFFERENTIAL/PLATELET
Basophils Absolute: 0.1 10*3/uL (ref 0.0–0.2)
Basos: 1 %
EOS (ABSOLUTE): 0.2 10*3/uL (ref 0.0–0.4)
Eos: 3 %
Hematocrit: 45.8 % (ref 37.5–51.0)
Hemoglobin: 15.3 g/dL (ref 13.0–17.7)
Immature Grans (Abs): 0 10*3/uL (ref 0.0–0.1)
Immature Granulocytes: 0 %
Lymphocytes Absolute: 1.7 10*3/uL (ref 0.7–3.1)
Lymphs: 28 %
MCH: 28.8 pg (ref 26.6–33.0)
MCHC: 33.4 g/dL (ref 31.5–35.7)
MCV: 86 fL (ref 79–97)
Monocytes Absolute: 0.4 10*3/uL (ref 0.1–0.9)
Monocytes: 6 %
Neutrophils Absolute: 3.9 10*3/uL (ref 1.4–7.0)
Neutrophils: 62 %
Platelets: 150 10*3/uL (ref 150–450)
RBC: 5.32 x10E6/uL (ref 4.14–5.80)
RDW: 12.6 % (ref 11.6–15.4)
WBC: 6.3 10*3/uL (ref 3.4–10.8)

## 2022-08-28 LAB — PSA, TOTAL AND FREE
PSA, Free: <0.02 ng/mL
Prostate Specific Ag, Serum: <0.1 ng/mL (ref 0.0–4.0)

## 2022-08-28 LAB — CMP14+EGFR
ALT: 22 IU/L (ref 0–44)
AST: 16 IU/L (ref 0–40)
Albumin/Globulin Ratio: 2 (ref 1.2–2.2)
Albumin: 4.5 g/dL (ref 3.9–4.9)
Alkaline Phosphatase: 68 IU/L (ref 44–121)
BUN/Creatinine Ratio: 21 (ref 10–24)
BUN: 25 mg/dL (ref 8–27)
Bilirubin Total: 0.5 mg/dL (ref 0.0–1.2)
CO2: 22 mmol/L (ref 20–29)
Calcium: 9.6 mg/dL (ref 8.6–10.2)
Chloride: 99 mmol/L (ref 96–106)
Creatinine, Ser: 1.19 mg/dL (ref 0.76–1.27)
Globulin, Total: 2.3 g/dL (ref 1.5–4.5)
Glucose: 213 mg/dL — ABNORMAL HIGH (ref 70–99)
Potassium: 4.3 mmol/L (ref 3.5–5.2)
Sodium: 137 mmol/L (ref 134–144)
Total Protein: 6.8 g/dL (ref 6.0–8.5)
eGFR: 66 mL/min/{1.73_m2} (ref >59)

## 2022-08-28 LAB — LIPID PANEL
Chol/HDL Ratio: 2.9 ratio (ref 0.0–5.0)
Cholesterol, Total: 114 mg/dL (ref 100–199)
HDL: 39 mg/dL — ABNORMAL LOW (ref >39)
LDL Chol Calc (NIH): 48 mg/dL (ref 0–99)
Triglycerides: 163 mg/dL — ABNORMAL HIGH (ref 0–149)
VLDL Cholesterol Cal: 27 mg/dL (ref 5–40)

## 2022-09-09 DIAGNOSIS — L603 Nail dystrophy: Secondary | ICD-10-CM | POA: Diagnosis not present

## 2022-09-09 DIAGNOSIS — E1142 Type 2 diabetes mellitus with diabetic polyneuropathy: Secondary | ICD-10-CM | POA: Diagnosis not present

## 2022-09-09 DIAGNOSIS — L84 Corns and callosities: Secondary | ICD-10-CM | POA: Diagnosis not present

## 2022-09-09 DIAGNOSIS — M79676 Pain in unspecified toe(s): Secondary | ICD-10-CM | POA: Diagnosis not present

## 2022-09-20 LAB — HM DIABETES EYE EXAM

## 2022-09-24 NOTE — Progress Notes (Signed)
[facility] - Clinic Note  10/04/2022     CHIEF COMPLAINT Patient presents for Retina Evaluation   HISTORY OF PRESENT ILLNESS: Arthur Torres is a 72 y.o. male who presents to the clinic today for:   HPI     Retina Evaluation   In right eye.  This started 6 months ago.  Duration of 6 months.  I, the attending physician,  performed the HPI with the patient and updated documentation appropriately.        Comments   Patient here for Retina Evaluation. Referred by Dr Conley Rolls. Patient states noticed several months ago OD was like looking through a big stain. Dr Conley Rolls saw blood like a vessel had burst. Sees a big circle of blurry. No eye pain. AIC went from 8.5 to 6.7 in 30 days and lost 10 pounds.       Last edited by Rennis Chris, MD on 10/04/2022  5:03 PM.    Pt is here on the referral of Dr. Despina Arias for DM exam, pt states he went to see her because it had been about 5 years since he had an eye exam, pt states he has been diabetic for 7-8 years, he is on Jardiance and glipizide, his A1c was 6.7 on 04.01.24, prior to that it was 8.5, he was able to get it down in a month  Referring physician: Michaelle Copas, MD [address] [place],  [place] [address]  HISTORICAL INFORMATION:   Selected notes from the MEDICAL RECORD NUMBER Referred by Dr. Despina Arias for DM exam LEE:  Ocular Hx- PMH-    CURRENT MEDICATIONS: No current outpatient medications on file. (Ophthalmic Drugs)   No current facility-administered medications for this visit. (Ophthalmic Drugs)   Current Outpatient Medications (Other)  Medication Sig   glipiZIDE (GLUCOTROL) 5 MG tablet Take 1 tablet (5 mg total) by mouth 2 (two) times daily before a meal.   metoprolol succinate (TOPROL-XL) 50 MG 24 hr tablet Take 1 tablet (50 mg total) by mouth daily. Take with or immediately following a meal.   pantoprazole (PROTONIX) 40 MG tablet Take 1 tablet (40 mg total) by mouth daily.   rosuvastatin (CRESTOR) 10 MG tablet  Take 1 tablet (10 mg total) by mouth daily.   sitaGLIPtin-metformin (JANUMET) 50-500 MG tablet Take 1 tablet by mouth 2 (two) times daily with a meal.   aspirin 325 MG EC tablet Take 325 mg by mouth daily. (Patient not taking: Reported on 10/04/2022)   No current facility-administered medications for this visit. (Other)   REVIEW OF SYSTEMS: ROS   Positive for: Endocrine, Eyes Last edited by Laddie Aquas, COA on 10/04/2022  1:51 PM.     ALLERGIES Allergies  Allergen Reactions   Penicillins    Zocor [Simvastatin]    PAST MEDICAL HISTORY Past Medical History:  Diagnosis Date   CAD (coronary artery disease)    1999 occluded LAD treated with PCI and stenting, 80% stenosis of the first diagonal feeling angioplasty, 60-70% second stenosis, 50% ostial left main stenosis, 25% right coronary artery stenosis, 50% ostial PDA stenosis.   Cancer    prostate (treated with radiation)   COVID-19    Diabetes mellitus without complication    Hyperlipidemia    Hypertension    Joint pain    Past Surgical History:  Procedure Laterality Date   CARDIAC CATHETERIZATION     COLON SURGERY     hemrrhoids   CORONARY ANGIOPLASTY WITH STENT  PLACEMENT     FAMILY HISTORY Family History  Problem Relation Age of Onset   Diabetes Mother    Stroke Mother    Cancer Father        skin, prostate, stomach   Healthy Daughter    Healthy Son    Asthma Son    SOCIAL HISTORY Social History   Tobacco Use   Smoking status: Former    Types: Cigarettes    Quit date: 03/28/1998    Years since quitting: 24.5   Smokeless tobacco: Never  Vaping Use   Vaping Use: Never used  Substance Use Topics   Alcohol use: No   Drug use: No       OPHTHALMIC EXAM:  Base Eye Exam     Visual Acuity (Snellen - Linear)       Right Left   Dist cc 20/80 -2 20/20 -2   Dist ph cc 20/70 +1     Correction: Glasses         Tonometry (Tonopen, 1:45 PM)       Right Left   Pressure 16 16         Pupils        Dark Light Shape React APD   Right 3 2 Round Brisk None   Left 3 2 Round Brisk None         Visual Fields (Counting fingers)       Left Right    Full Full         Extraocular Movement       Right Left    Full, Ortho Full, Ortho         Neuro/Psych     Oriented x3: Yes   Mood/Affect: Normal         Dilation     Both eyes: 1.0% Mydriacyl, 2.5% Phenylephrine @ 1:45 PM           Slit Lamp and Fundus Exam     Slit Lamp Exam       Right Left   Lids/Lashes Dermatochalasis - upper lid, Meibomian gland dysfunction Dermatochalasis - upper lid, Meibomian gland dysfunction   Conjunctiva/Sclera White and quiet nasal and temporal pinguecula   Cornea arcus Trace tear film debris   Anterior Chamber deep and clear deep and clear   Iris Round and dilated, No NVI Round and dilated, No NVI   Lens 2+ Nuclear sclerosis with brunescence, 2+ Cortical cataract 2+ Nuclear sclerosis with brunescence, 2+ Cortical cataract   Anterior Vitreous mild syneresis mild syneresis         Fundus Exam       Right Left   Disc Pink and Sharp, +PPP Pink and Sharp, mild PPP   C/D Ratio 0.2 0.2   Macula Blunted foveal reflex, central edema, scattered MA/DBH greatest centrally Flat, Blunted foveal reflex, scattered MA   Vessels attenuated, Tortuous attenuated, mild tortuosity   Periphery Attached, 360 MA / DBH Attached, rare MA           Refraction     Wearing Rx       Sphere Cylinder Axis Add   Right -1.50 +0.75 114 +2.50   Left -1.50 +0.25 163 +2.50         Manifest Refraction       Sphere Cylinder Axis Dist VA   Right -1.00 +0.50 115 20/70-2   Left -0.75 +0.50 045 20/20-1            IMAGING AND PROCEDURES  Imaging and  Procedures for 10/04/2022  OCT, Retina - OU - Both Eyes       Right Eye Quality was good. Central Foveal Thickness: 422. Progression has no prior data. Findings include no SRF, abnormal foveal contour, intraretinal hyper-reflective material,  intraretinal fluid, vitreomacular adhesion (Central IRF/IRHM).   Left Eye Quality was good. Central Foveal Thickness: 302. Progression has no prior data. Findings include normal foveal contour, no IRF, no SRF (Trace cystic changes temporal macula).   Notes *Images captured and stored on drive  Diagnosis / Impression:  OD: Central IRF/IRHM -- +DME, w/ possible CME component OS: NFP; no IRF/SRF; tr cystic changes temporal macula  Clinical management:  See below  Abbreviations: NFP - Normal foveal profile. CME - cystoid macular edema. PED - pigment epithelial detachment. IRF - intraretinal fluid. SRF - subretinal fluid. EZ - ellipsoid zone. ERM - epiretinal membrane. ORA - outer retinal atrophy. ORT - outer retinal tubulation. SRHM - subretinal hyper-reflective material. IRHM - intraretinal hyper-reflective material      Fluorescein Angiography Optos (Transit OD)       Right Eye Progression has no prior data. Early phase findings include delayed filling, microaneurysm, vascular perfusion defect (Delayed venous return). Mid/Late phase findings include leakage, microaneurysm, vascular perfusion defect (Large area of vascular non-perfusion temporal periphery with +perivascular leakage, scattered leaking MA greatest centrally).   Left Eye Progression has no prior data. Early phase findings include microaneurysm. Mid/Late phase findings include leakage, microaneurysm (Scattered MA with late leakage).   Notes **Images stored on drive**  Impression: OD: scattered leaking MA greatest centrally; no NV; Large area of vascular non-perfusion temporal periphery with +perivascular leakage OS: Scattered MA with mild, late leakage      Intravitreal Injection, Pharmacologic Agent - OD - Right Eye       Time Out 10/04/2022. 2:58 PM. Confirmed correct patient, procedure, site, and patient consented.   Anesthesia Topical anesthesia was used. Anesthetic medications included Lidocaine 4%,  Proparacaine 0.5%.   Procedure Preparation included 5% betadine to ocular surface. A supplied needle was used.   Injection: 1.25 mg Bevacizumab 1.25mg /0.68ml   Route: Intravitreal, Site: Right Eye   NDC: [id number], Lot: [id number], Expiration date: 11/14/2022   Post-op Post injection exam found visual acuity of at least counting fingers. The patient tolerated the procedure well. There were no complications. The patient received written and verbal post procedure care education. Post injection medications were not given.            ASSESSMENT/PLAN:    ICD-10-CM   1. Moderate nonproliferative diabetic retinopathy of both eyes with macular edema associated with type 2 diabetes mellitus  E11.3313 OCT, Retina - OU - Both Eyes    Intravitreal Injection, Pharmacologic Agent - OD - Right Eye    Bevacizumab (AVASTIN) SOLN 1.25 mg    2. Essential hypertension  I10 Fluorescein Angiography Optos (Transit OD)    3. Hypertensive retinopathy of both eyes  H35.033 Fluorescein Angiography Optos (Transit OD)    4. Combined forms of age-related cataract of both eyes  H25.813       Moderate Non-proliferative diabetic retinopathy, both eyes (OD>OS)  - A1c 6.7 on 4.1.24 - The incidence, risk factors for progression, natural history and treatment options for diabetic retinopathy were discussed with patient.   - The need for close monitoring of blood glucose, blood pressure, and serum lipids, avoiding cigarette or any type of tobacco, and the need for long term follow up was also discussed with patient. - exam shows scattered MA  OU (OD > OS) - FA (04.08.24) shows OD: scattered leaking MA greatest centrally; no NV; Large area of vascular non-perfusion temporal periphery with +perivascular leakage; ZO:[id number] MA with late leakage, no NV - OCT shows diabetic macular edema OD, tr cystic changes OS (there may be an RVO component to edema OD)  - The natural history, pathology, and characteristics of  diabetic macular edema discussed with patient.  A generalized discussion of the major clinical trials concerning treatment of diabetic macular edema (ETDRS, DCT, SCORE, RISE / RIDE, and ongoing DRCR net studies) was completed.  This discussion included mention of the various approaches to treating diabetic macular edema (observation, laser photocoagulation, anti-VEGF injections with lucentis / Avastin / Eylea, steroid injections with Kenalog / Ozurdex, and intraocular surgery with vitrectomy).  The goal hemoglobin A1C of 6-7 was discussed, as well as importance of smoking cessation and hypertension control.  Need for ongoing treatment and monitoring were specifically discussed with reference to chronic nature of diabetic macular edema. - recommend IVA OD #1 today, 04.08.24 for DME - pt wishes to proceed - RBA of procedure discussed, questions answered - IVA informed consent obtained and signed, 04.08.24 (OD) - see procedure note - f/u in 4 wks -- DFE/OCT, possible injection  2,3. Hypertensive retinopathy OU - discussed importance of tight BP control - possible RVO/CME component to macular edema OD - monitor  4. Mixed Cataract OU - The symptoms of cataract, surgical options, and treatments and risks were discussed with patient. - discussed diagnosis and progression - monitor  Ophthalmic Meds Ordered this visit:  Meds ordered this encounter  Medications   Bevacizumab (AVASTIN) SOLN 1.25 mg     Return in about 4 weeks (around 11/01/2022) for NPDR OU, DFE, OCT, Possible Injxn.  There are no Patient Instructions on file for this visit.  Explained the diagnoses, plan, and follow up with the patient and they expressed understanding.  Patient expressed understanding of the importance of proper follow up care.   This document serves as a record of services personally performed by Karie Chimera, MD, PhD. It was created on their behalf by Glee Arvin. Manson Passey, OA an ophthalmic technician. The creation  of this record is the provider's dictation and/or activities during the visit.    Electronically signed by: Glee Arvin. Manson Passey, [place] 04.08.2024 5:06 PM  Karie Chimera, M.D., Ph.D. Diseases & Surgery of the Retina and Vitreous [facility]  I have reviewed the above documentation for accuracy and completeness, and I agree with the above. Karie Chimera, M.D., Ph.D. 10/04/22 5:11 PM   Abbreviations: M myopia (nearsighted); A astigmatism; H hyperopia (farsighted); P presbyopia; Mrx spectacle prescription;  CTL contact lenses; OD right eye; OS left eye; OU both eyes  XT exotropia; ET esotropia; PEK punctate epithelial keratitis; PEE punctate epithelial erosions; DES dry eye syndrome; MGD meibomian gland dysfunction; ATs artificial tears; PFAT's preservative free artificial tears; NSC nuclear sclerotic cataract; PSC posterior subcapsular cataract; ERM epi-retinal membrane; PVD posterior vitreous detachment; RD retinal detachment; DM diabetes mellitus; DR diabetic retinopathy; NPDR non-proliferative diabetic retinopathy; PDR proliferative diabetic retinopathy; CSME clinically significant macular edema; DME diabetic macular edema; dbh dot blot hemorrhages; CWS cotton wool spot; POAG primary open angle glaucoma; C/D cup-to-disc ratio; HVF humphrey visual field; GVF goldmann visual field; OCT optical coherence tomography; IOP intraocular pressure; BRVO Branch retinal vein occlusion; CRVO central retinal vein occlusion; CRAO central retinal artery occlusion; BRAO branch retinal artery occlusion; RT retinal tear; SB scleral buckle;  PPV pars plana vitrectomy; VH Vitreous hemorrhage; PRP panretinal laser photocoagulation; IVK intravitreal kenalog; VMT vitreomacular traction; MH Macular hole;  NVD neovascularization of the disc; NVE neovascularization elsewhere; AREDS age related eye disease study; ARMD age related macular degeneration; POAG primary open angle glaucoma; EBMD epithelial/anterior  basement membrane dystrophy; ACIOL anterior chamber intraocular lens; IOL intraocular lens; PCIOL posterior chamber intraocular lens; Phaco/IOL phacoemulsification with intraocular lens placement; PRK photorefractive keratectomy; LASIK laser assisted in situ keratomileusis; HTN hypertension; DM diabetes mellitus; COPD chronic obstructive pulmonary disease

## 2022-09-27 ENCOUNTER — Encounter: Payer: Self-pay | Admitting: Nurse Practitioner

## 2022-09-27 ENCOUNTER — Ambulatory Visit (INDEPENDENT_AMBULATORY_CARE_PROVIDER_SITE_OTHER): Payer: Medicare HMO | Admitting: Nurse Practitioner

## 2022-09-27 VITALS — BP 130/69 | HR 61 | Temp 97.4°F | Resp 20 | Ht 70.0 in | Wt 181.0 lb

## 2022-09-27 DIAGNOSIS — E119 Type 2 diabetes mellitus without complications: Secondary | ICD-10-CM | POA: Diagnosis not present

## 2022-09-27 LAB — BAYER DCA HB A1C WAIVED: HB A1C (BAYER DCA - WAIVED): 6.7 % — ABNORMAL HIGH (ref 4.8–5.6)

## 2022-09-27 NOTE — Patient Instructions (Signed)

## 2022-09-27 NOTE — Progress Notes (Signed)
   Subjective:    Patient ID: Arthur Torres, male    DOB: [birth year], 72 y.o.   MRN: [id number]   Chief Complaint: diabetes  Patient has not been doing well with diet control. His wife is gone during the week , taking care of grand child, so he is on his own for diet control. He does no dedicated exercise. No medication changes were made at last visit. He wanted to try diet control. He has really been wathc ing his diet but he has not been checking his blood sugars. Lab Results  Component Value Date   HGBA1C 8.5 (H) 08/27/2022     HPI  Patient Active Problem List   Diagnosis Date Noted   Allergic rhinitis due to pollen 10/22/2015   BMI 27.0-27.9,adult 04/17/2015   H/O prostate cancer 10/04/2014   History of right coronary artery stent placement 10/06/2012   Hyperlipidemia with target LDL less than 100 10/03/2012   Essential hypertension, benign 10/03/2012   Diabetes 10/03/2012       Review of Systems  Constitutional:  Negative for diaphoresis.  Eyes:  Negative for pain.  Respiratory:  Negative for shortness of breath.   Cardiovascular:  Negative for chest pain, palpitations and leg swelling.  Gastrointestinal:  Negative for abdominal pain.  Endocrine: Negative for polydipsia.  Skin:  Negative for rash.  Neurological:  Negative for dizziness, weakness and headaches.  Hematological:  Does not bruise/bleed easily.  All other systems reviewed and are negative.      Objective:   Physical Exam Vitals reviewed.  Constitutional:      Appearance: Normal appearance.  Cardiovascular:     Rate and Rhythm: Normal rate and regular rhythm.     Heart sounds: Normal heart sounds.  Pulmonary:     Effort: Pulmonary effort is normal.     Breath sounds: Normal breath sounds.  Skin:    General: Skin is warm.  Neurological:     General: No focal deficit present.     Mental Status: He is alert and oriented to person, place, and time.  Psychiatric:        Mood and Affect: Mood  normal.        Behavior: Behavior normal.    BP 130/69   Pulse 61   Temp (!) 97.4 F (36.3 C) (Temporal)   Resp 20   Ht 5\' 10"  (1.778 m)   Wt 181 lb (82.1 kg)   SpO2 99%   BMI 25.97 kg/m   Hgba1c 6.7%       Assessment & Plan:   [name] in today with chief complaint of Diabetes   1. Type 2 diabetes mellitus without complication, without long-term current use of insulin Continue to watch carbs in diet Follow up I 3 months - Bayer DCA Hb A1c Waived    The above assessment and management plan was discussed with the patient. The patient verbalized understanding of and has agreed to the management plan. Patient is aware to call the clinic if symptoms persist or worsen. Patient is aware when to return to the clinic for a follow-up visit. Patient educated on when it is appropriate to go to the emergency department.   [name] Done, FNP

## 2022-10-04 ENCOUNTER — Encounter (INDEPENDENT_AMBULATORY_CARE_PROVIDER_SITE_OTHER): Payer: Self-pay | Admitting: Ophthalmology

## 2022-10-04 ENCOUNTER — Ambulatory Visit (INDEPENDENT_AMBULATORY_CARE_PROVIDER_SITE_OTHER): Payer: Medicare HMO | Admitting: Ophthalmology

## 2022-10-04 DIAGNOSIS — I10 Essential (primary) hypertension: Secondary | ICD-10-CM

## 2022-10-04 DIAGNOSIS — H25813 Combined forms of age-related cataract, bilateral: Secondary | ICD-10-CM

## 2022-10-04 DIAGNOSIS — E113313 Type 2 diabetes mellitus with moderate nonproliferative diabetic retinopathy with macular edema, bilateral: Secondary | ICD-10-CM

## 2022-10-04 DIAGNOSIS — H35033 Hypertensive retinopathy, bilateral: Secondary | ICD-10-CM

## 2022-10-04 DIAGNOSIS — H3581 Retinal edema: Secondary | ICD-10-CM

## 2022-10-04 MED ORDER — BEVACIZUMAB CHEMO INJECTION 1.25MG/0.05ML SYRINGE FOR KALEIDOSCOPE
1.2500 mg | INTRAVITREAL | Status: AC | PRN
Start: 2022-10-04 — End: 2022-10-04
  Administered 2022-10-04: 1.25 mg via INTRAVITREAL

## 2022-10-26 NOTE — Progress Notes (Signed)
[facility] - Clinic Note  11/01/2022     CHIEF COMPLAINT Patient presents for Retina Follow Up   HISTORY OF PRESENT ILLNESS: Arthur Torres is a 72 y.o. male who presents to the clinic today for:   HPI     Retina Follow Up   Patient presents with  Diabetic Retinopathy.  In both eyes.  This started years ago.  Duration of 4 weeks.  I, the attending physician,  performed the HPI with the patient and updated documentation appropriately.        Comments   Patient feels that the vision is about the same. He feels that the blob is gone. He does not checking his blood sugar.       Last edited by Rennis Chris, MD on 11/04/2022 11:32 PM.    Pt states he did not have any problems after his first injection, he states he sees better without his glasses than with them, the "spot" in his vision is pretty much gone now  Referring physician: Bennie Pierini, FNP [address] [place],  [place] [address]  HISTORICAL INFORMATION:   Selected notes from the MEDICAL RECORD NUMBER Referred by Dr. Despina Arias for DM exam LEE:  Ocular Hx- PMH-    CURRENT MEDICATIONS: No current outpatient medications on file. (Ophthalmic Drugs)   No current facility-administered medications for this visit. (Ophthalmic Drugs)   Current Outpatient Medications (Other)  Medication Sig   aspirin 325 MG EC tablet Take 325 mg by mouth daily.   glipiZIDE (GLUCOTROL) 5 MG tablet Take 1 tablet (5 mg total) by mouth 2 (two) times daily before a meal.   metoprolol succinate (TOPROL-XL) 50 MG 24 hr tablet Take 1 tablet (50 mg total) by mouth daily. Take with or immediately following a meal.   pantoprazole (PROTONIX) 40 MG tablet Take 1 tablet (40 mg total) by mouth daily.   rosuvastatin (CRESTOR) 10 MG tablet Take 1 tablet (10 mg total) by mouth daily.   sitaGLIPtin-metformin (JANUMET) 50-500 MG tablet Take 1 tablet by mouth 2 (two) times daily with a meal.   No current facility-administered  medications for this visit. (Other)   REVIEW OF SYSTEMS: ROS   Positive for: Endocrine, Eyes Last edited by Julieanne Cotton, COT on 11/01/2022 12:53 PM.      ALLERGIES Allergies  Allergen Reactions   Penicillins    Zocor [Simvastatin]    PAST MEDICAL HISTORY Past Medical History:  Diagnosis Date   CAD (coronary artery disease)    1999 occluded LAD treated with PCI and stenting, 80% stenosis of the first diagonal feeling angioplasty, 60-70% second stenosis, 50% ostial left main stenosis, 25% right coronary artery stenosis, 50% ostial PDA stenosis.   Cancer [facility])    prostate (treated with radiation)   COVID-19    Diabetes mellitus without complication (HCC)    Hyperlipidemia    Hypertension    Joint pain    Past Surgical History:  Procedure Laterality Date   CARDIAC CATHETERIZATION     COLON SURGERY     hemrrhoids   CORONARY ANGIOPLASTY WITH STENT PLACEMENT     FAMILY HISTORY Family History  Problem Relation Age of Onset   Diabetes Mother    Stroke Mother    Cancer Father        skin, prostate, stomach   Healthy Daughter    Healthy Son    Asthma Son    SOCIAL HISTORY Social History   Tobacco Use   Smoking  status: Former    Types: Cigarettes    Quit date: 03/28/1998    Years since quitting: 24.6   Smokeless tobacco: Never  Vaping Use   Vaping Use: Never used  Substance Use Topics   Alcohol use: No   Drug use: No       OPHTHALMIC EXAM:  Base Eye Exam     Visual Acuity (Snellen - Linear)       Right Left   Dist [place] 20/50 +1 20/25 -2   Dist ph [place] NI NI         Tonometry (Tonopen, 12:59 PM)       Right Left   Pressure 17 15         Pupils       Dark Light Shape React APD   Right 3 2 Round Brisk None   Left 3 2 Round Brisk None         Visual Fields       Left Right    Full Full         Extraocular Movement       Right Left    Full, Ortho Full, Ortho         Neuro/Psych     Oriented x3: Yes   Mood/Affect: Normal          Dilation     Both eyes: 1.0% Mydriacyl @ 12:53 PM           Slit Lamp and Fundus Exam     Slit Lamp Exam       Right Left   Lids/Lashes Dermatochalasis - upper lid, Meibomian gland dysfunction Dermatochalasis - upper lid, Meibomian gland dysfunction   Conjunctiva/Sclera White and quiet nasal and temporal pinguecula   Cornea arcus Trace tear film debris   Anterior Chamber deep and clear deep and clear   Iris Round and dilated, No NVI Round and dilated, No NVI   Lens 2+ Nuclear sclerosis with brunescence, 2+ Cortical cataract 2+ Nuclear sclerosis with brunescence, 2+ Cortical cataract   Anterior Vitreous mild syneresis mild syneresis         Fundus Exam       Right Left   Disc Pink and Sharp, +PPP Pink and Sharp, mild PPP   C/D Ratio 0.2 0.2   Macula Blunted foveal reflex, central edema, scattered MA/DBH greatest centrally -- all improved Flat, Blunted foveal reflex, scattered MA   Vessels attenuated, Tortuous attenuated, mild tortuosity   Periphery Attached, 360 MA / DBH -- improved Attached, rare MA           Refraction     Wearing Rx       Sphere Cylinder Axis Add   Right -1.50 +0.75 114 +2.50   Left -1.50 +0.25 163 +2.50            IMAGING AND PROCEDURES  Imaging and Procedures for 11/01/2022  OCT, Retina - OU - Both Eyes       Right Eye Quality was good. Central Foveal Thickness: 404. Progression has improved. Findings include no SRF, abnormal foveal contour, intraretinal hyper-reflective material, intraretinal fluid, vitreomacular adhesion (Mild interval improvement in central IRF/IRHM).   Left Eye Quality was good. Central Foveal Thickness: 301. Progression has been stable. Findings include normal foveal contour, no IRF, no SRF.   Notes *Images captured and stored on drive  Diagnosis / Impression:  OD: +DME/CME - Mild interval improvement in central IRF/IRHM OS: NFP; no IRF/SRF  Clinical management:  See below  Abbreviations:  NFP - Normal foveal profile. CME - cystoid macular edema. PED - pigment epithelial detachment. IRF - intraretinal fluid. SRF - subretinal fluid. EZ - ellipsoid zone. ERM - epiretinal membrane. ORA - outer retinal atrophy. ORT - outer retinal tubulation. SRHM - subretinal hyper-reflective material. IRHM - intraretinal hyper-reflective material      Intravitreal Injection, Pharmacologic Agent - OD - Right Eye       Time Out 11/01/2022. 1:19 PM. Confirmed correct patient, procedure, site, and patient consented.   Anesthesia Topical anesthesia was used. Anesthetic medications included Lidocaine 4%, Proparacaine 0.5%.   Procedure Preparation included 5% betadine to ocular surface. A supplied needle was used.   Injection: 1.25 mg Bevacizumab 1.25mg /0.38ml   Route: Intravitreal, Site: Right Eye   NDC: P3213405, Lot: [id number] , Expiration date: 11/26/2022   Post-op Post injection exam found visual acuity of at least counting fingers. The patient tolerated the procedure well. There were no complications. The patient received written and verbal post procedure care education. Post injection medications were not given.             ASSESSMENT/PLAN:    ICD-10-CM   1. Moderate nonproliferative diabetic retinopathy of both eyes with macular edema associated with type 2 diabetes mellitus (HCC)  E11.3313 OCT, Retina - OU - Both Eyes    Intravitreal Injection, Pharmacologic Agent - OD - Right Eye    Bevacizumab (AVASTIN) SOLN 1.25 mg    2. Long term (current) use of oral hypoglycemic drugs  Z79.84     3. Essential hypertension  I10     4. Hypertensive retinopathy of both eyes  H35.033     5. Combined forms of age-related cataract of both eyes  H25.813      1,2. Moderate Non-proliferative diabetic retinopathy, both eyes (OD>OS)  - A1c 6.7 on 4.1.24 - exam shows scattered MA OU (OD > OS) - s/p IVA OD #1 (04.08.24) - FA (04.08.24) shows OD: scattered leaking MA greatest centrally; no  NV; Large area of vascular non-perfusion temporal periphery with +perivascular leakage; DU:KGURKYHCW MA with late leakage, no NV  - OCT OD: +DME/CME - Mild interval improvement in central IRF/IRHM; OS: NFP; no IRF/SRF - recommend IVA OD #2 today, 05.06.24 for DME - pt wishes to proceed - RBA of procedure discussed, questions answered - IVA informed consent obtained and signed, 04.08.24 (OD) - see procedure note - f/u in 4 wks -- DFE/OCT, possible injection  3,4. Hypertensive retinopathy OU - discussed importance of tight BP control - possible RVO/CME component to macular edema OD - monitor  5. Mixed Cataract OU - The symptoms of cataract, surgical options, and treatments and risks were discussed with patient. - discussed diagnosis and progression - monitor  Ophthalmic Meds Ordered this visit:  Meds ordered this encounter  Medications   Bevacizumab (AVASTIN) SOLN 1.25 mg     Return in about 4 weeks (around 11/29/2022) for f/u NPDR OU, DFE, OCT.  There are no Patient Instructions on file for this visit.  Explained the diagnoses, plan, and follow up with the patient and they expressed understanding.  Patient expressed understanding of the importance of proper follow up care.   This document serves as a record of services personally performed by Karie Chimera, MD, PhD. It was created on their behalf by De Blanch, an ophthalmic technician. The creation of this record is the provider's dictation and/or activities during the visit.    Electronically signed by: De Blanch, OA, 11/04/22  11:33 PM  This  document serves as a record of services personally performed by Karie Chimera, MD, PhD. It was created on their behalf by Glee Arvin. Manson Passey, OA an ophthalmic technician. The creation of this record is the provider's dictation and/or activities during the visit.    Electronically signed by: Glee Arvin. Manson Passey, [place] 05.06.2024 11:33 PM  Karie Chimera, M.D., Ph.D. Diseases &  Surgery of the Retina and Vitreous [facility]  I have reviewed the above documentation for accuracy and completeness, and I agree with the above. Karie Chimera, M.D., Ph.D. 11/04/22 11:35 PM   Abbreviations: M myopia (nearsighted); A astigmatism; H hyperopia (farsighted); P presbyopia; Mrx spectacle prescription;  CTL contact lenses; OD right eye; OS left eye; OU both eyes  XT exotropia; ET esotropia; PEK punctate epithelial keratitis; PEE punctate epithelial erosions; DES dry eye syndrome; MGD meibomian gland dysfunction; ATs artificial tears; PFAT's preservative free artificial tears; NSC nuclear sclerotic cataract; PSC posterior subcapsular cataract; ERM epi-retinal membrane; PVD posterior vitreous detachment; RD retinal detachment; DM diabetes mellitus; DR diabetic retinopathy; NPDR non-proliferative diabetic retinopathy; PDR proliferative diabetic retinopathy; CSME clinically significant macular edema; DME diabetic macular edema; dbh dot blot hemorrhages; CWS cotton wool spot; POAG primary open angle glaucoma; C/D cup-to-disc ratio; HVF humphrey visual field; GVF goldmann visual field; OCT optical coherence tomography; IOP intraocular pressure; BRVO Branch retinal vein occlusion; CRVO central retinal vein occlusion; CRAO central retinal artery occlusion; BRAO branch retinal artery occlusion; RT retinal tear; SB scleral buckle; PPV pars plana vitrectomy; VH Vitreous hemorrhage; PRP panretinal laser photocoagulation; IVK intravitreal kenalog; VMT vitreomacular traction; MH Macular hole;  NVD neovascularization of the disc; NVE neovascularization elsewhere; AREDS age related eye disease study; ARMD age related macular degeneration; POAG primary open angle glaucoma; EBMD epithelial/anterior basement membrane dystrophy; ACIOL anterior chamber intraocular lens; IOL intraocular lens; PCIOL posterior chamber intraocular lens; Phaco/IOL phacoemulsification with intraocular lens  placement; PRK photorefractive keratectomy; LASIK laser assisted in situ keratomileusis; HTN hypertension; DM diabetes mellitus; COPD chronic obstructive pulmonary disease

## 2022-11-01 ENCOUNTER — Encounter (INDEPENDENT_AMBULATORY_CARE_PROVIDER_SITE_OTHER): Payer: Self-pay | Admitting: Ophthalmology

## 2022-11-01 ENCOUNTER — Ambulatory Visit (INDEPENDENT_AMBULATORY_CARE_PROVIDER_SITE_OTHER): Payer: Medicare HMO | Admitting: Ophthalmology

## 2022-11-01 DIAGNOSIS — H35033 Hypertensive retinopathy, bilateral: Secondary | ICD-10-CM | POA: Diagnosis not present

## 2022-11-01 DIAGNOSIS — E113313 Type 2 diabetes mellitus with moderate nonproliferative diabetic retinopathy with macular edema, bilateral: Secondary | ICD-10-CM | POA: Diagnosis not present

## 2022-11-01 DIAGNOSIS — I10 Essential (primary) hypertension: Secondary | ICD-10-CM | POA: Diagnosis not present

## 2022-11-01 DIAGNOSIS — Z7984 Long term (current) use of oral hypoglycemic drugs: Secondary | ICD-10-CM

## 2022-11-01 DIAGNOSIS — H25813 Combined forms of age-related cataract, bilateral: Secondary | ICD-10-CM

## 2022-11-01 MED ORDER — BEVACIZUMAB CHEMO INJECTION 1.25MG/0.05ML SYRINGE FOR KALEIDOSCOPE
1.2500 mg | INTRAVITREAL | Status: AC | PRN
Start: 2022-11-01 — End: 2022-11-01
  Administered 2022-11-01: 1.25 mg via INTRAVITREAL

## 2022-11-18 NOTE — Progress Notes (Signed)
[facility] - Clinic Note  11/29/2022     CHIEF COMPLAINT Patient presents for Retina Follow Up   HISTORY OF PRESENT ILLNESS: Arthur Torres is a 72 y.o. male who presents to the clinic today for:   HPI     Retina Follow Up   Patient presents with  Diabetic Retinopathy.  In both eyes.  This started 4 weeks ago.  Duration of 4 weeks.  Since onset it is stable.  I, the attending physician,  performed the HPI with the patient and updated documentation appropriately.        Comments   4 week retina follow up NPDR and IVA OD pt is reporting some wavy lines in the right eye he denies any flashes of light       Last edited by Rennis Chris, MD on 11/29/2022  5:31 PM.    Pt states he does not see the "stain" in his vision anymore  Referring physician: Bennie Pierini, FNP [address] [place],  [place] [address]  HISTORICAL INFORMATION:   Selected notes from the MEDICAL RECORD NUMBER Referred by Dr. Despina Arias for DM exam LEE:  Ocular Hx- PMH-    CURRENT MEDICATIONS: No current outpatient medications on file. (Ophthalmic Drugs)   No current facility-administered medications for this visit. (Ophthalmic Drugs)   Current Outpatient Medications (Other)  Medication Sig   aspirin 325 MG EC tablet Take 325 mg by mouth daily.   glipiZIDE (GLUCOTROL) 5 MG tablet TAKE 1 TABLET (5 MG TOTAL) BY MOUTH TWICE A DAY BEFORE MEALS   metoprolol succinate (TOPROL-XL) 50 MG 24 hr tablet Take 1 tablet (50 mg total) by mouth daily. Take with or immediately following a meal.   pantoprazole (PROTONIX) 40 MG tablet Take 1 tablet (40 mg total) by mouth daily.   rosuvastatin (CRESTOR) 10 MG tablet Take 1 tablet (10 mg total) by mouth daily.   sitaGLIPtin-metformin (JANUMET) 50-500 MG tablet Take 1 tablet by mouth 2 (two) times daily with a meal.   No current facility-administered medications for this visit. (Other)   REVIEW OF SYSTEMS: ROS   Positive for: Endocrine,  Eyes Last edited by Etheleen Mayhew, COT on 11/29/2022  1:33 PM.     ALLERGIES Allergies  Allergen Reactions   Penicillins    Zocor [Simvastatin]    PAST MEDICAL HISTORY Past Medical History:  Diagnosis Date   CAD (coronary artery disease)    1999 occluded LAD treated with PCI and stenting, 80% stenosis of the first diagonal feeling angioplasty, 60-70% second stenosis, 50% ostial left main stenosis, 25% right coronary artery stenosis, 50% ostial PDA stenosis.   Cancer (HCC)    prostate (treated with radiation)   COVID-19    Diabetes mellitus without complication (HCC)    Hyperlipidemia    Hypertension    Joint pain    Past Surgical History:  Procedure Laterality Date   CARDIAC CATHETERIZATION     COLON SURGERY     hemrrhoids   CORONARY ANGIOPLASTY WITH STENT PLACEMENT     FAMILY HISTORY Family History  Problem Relation Age of Onset   Diabetes Mother    Stroke Mother    Cancer Father        skin, prostate, stomach   Healthy Daughter    Healthy Son    Asthma Son    SOCIAL HISTORY Social History   Tobacco Use   Smoking status: Former    Types: Cigarettes    Quit date: 03/28/1998  Years since quitting: 24.6   Smokeless tobacco: Never  Vaping Use   Vaping Use: Never used  Substance Use Topics   Alcohol use: No   Drug use: No       OPHTHALMIC EXAM:  Base Eye Exam     Visual Acuity (Snellen - Linear)       Right Left   Dist [place] 20/60 -1 20/30 -1   Dist ph cc NI NI    Correction: Glasses         Tonometry (Tonopen, 1:36 PM)       Right Left   Pressure 15 15         Pupils       Pupils Dark Light Shape React APD   Right PERRL 3 2 Round Brisk None   Left PERRL 3 2 Round Brisk None         Visual Fields       Left Right    Full Full         Extraocular Movement       Right Left    Full, Ortho Full, Ortho         Neuro/Psych     Oriented x3: Yes   Mood/Affect: Normal         Dilation     Both eyes: 2.5%  Phenylephrine @ 1:36 PM           Slit Lamp and Fundus Exam     Slit Lamp Exam       Right Left   Lids/Lashes Dermatochalasis - upper lid, Meibomian gland dysfunction Dermatochalasis - upper lid, Meibomian gland dysfunction   Conjunctiva/Sclera White and quiet nasal and temporal pinguecula   Cornea arcus Trace tear film debris   Anterior Chamber deep and clear deep and clear   Iris Round and dilated, No NVI Round and dilated, No NVI   Lens 2+ Nuclear sclerosis with brunescence, 2+ Cortical cataract 2+ Nuclear sclerosis with brunescence, 2+ Cortical cataract   Anterior Vitreous mild syneresis mild syneresis         Fundus Exam       Right Left   Disc Pink and Sharp, +PPP Pink and Sharp, mild PPP   C/D Ratio 0.2 0.2   Macula Blunted foveal reflex, central edema, scattered MA/DBH greatest centrally -- all improved Flat, Blunted foveal reflex, scattered MA   Vessels attenuated, Tortuous attenuated, mild tortuosity   Periphery Attached, 360 MA / DBH -- improved Attached, rare MA           Refraction     Wearing Rx       Sphere Cylinder Axis Add   Right -1.50 +0.75 114 +2.50   Left -1.50 +0.25 163 +2.50            IMAGING AND PROCEDURES  Imaging and Procedures for 11/29/2022  OCT, Retina - OU - Both Eyes       Right Eye Quality was good. Central Foveal Thickness: 384. Progression has improved. Findings include no SRF, abnormal foveal contour, intraretinal hyper-reflective material, intraretinal fluid, vitreomacular adhesion (Mild interval improvement in central IRF/IRHM).   Left Eye Quality was good. Central Foveal Thickness: 305. Progression has been stable. Findings include normal foveal contour, no IRF, no SRF.   Notes *Images captured and stored on drive  Diagnosis / Impression:  OD: +DME/CME - Mild interval improvement in central IRF/IRHM OS: NFP; no IRF/SRF  Clinical management:  See below  Abbreviations: NFP - Normal foveal profile. CME -  cystoid macular edema. PED - pigment epithelial detachment. IRF - intraretinal fluid. SRF - subretinal fluid. EZ - ellipsoid zone. ERM - epiretinal membrane. ORA - outer retinal atrophy. ORT - outer retinal tubulation. SRHM - subretinal hyper-reflective material. IRHM - intraretinal hyper-reflective material      Intravitreal Injection, Pharmacologic Agent - OD - Right Eye       Time Out 11/29/2022. 2:04 PM. Confirmed correct patient, procedure, site, and patient consented.   Anesthesia Topical anesthesia was used. Anesthetic medications included Lidocaine 4%, Proparacaine 0.5%.   Procedure Preparation included 5% betadine to ocular surface, eyelid speculum. A (32g) needle was used.   Injection: 1.25 mg Bevacizumab 1.25mg /0.81ml   Route: Intravitreal, Site: Right Eye   NDC: P3213405, Lot: [id number], Expiration date: 02/26/2023   Post-op Post injection exam found visual acuity of at least counting fingers. The patient tolerated the procedure well. There were no complications. The patient received written and verbal post procedure care education. Post injection medications were not given.            ASSESSMENT/PLAN:    ICD-10-CM   1. Moderate nonproliferative diabetic retinopathy of both eyes with macular edema associated with type 2 diabetes mellitus (HCC)  E11.3313 OCT, Retina - OU - Both Eyes    Intravitreal Injection, Pharmacologic Agent - OD - Right Eye    Bevacizumab (AVASTIN) SOLN 1.25 mg    2. Long term (current) use of oral hypoglycemic drugs  Z79.84     3. Essential hypertension  I10     4. Hypertensive retinopathy of both eyes  H35.033     5. Combined forms of age-related cataract of both eyes  H25.813      1,2. Moderate Non-proliferative diabetic retinopathy, both eyes (OD>OS)  - A1c 6.7 on 4.1.24 - exam shows scattered MA OU (OD > OS) - s/p IVA OD #1 (04.08.24), #2 (05.06.24) - FA (04.08.24) shows OD: scattered leaking MA greatest centrally; no NV; Large  area of vascular non-perfusion temporal periphery with +perivascular leakage; AV:WUJWJXBJY MA with late leakage, no NV  - OCT OD: +DME/CME - Mild interval improvement in central IRF/IRHM; OS: NFP; no IRF/SRF - recommend IVA OD #3 today, 06.03.24 for DME - pt wishes to proceed - RBA of procedure discussed, questions answered - IVA informed consent obtained and signed, 04.08.24 (OD) - see procedure note - f/u in 4 wks -- DFE/OCT, possible injection  3,4. Hypertensive retinopathy OU - discussed importance of tight BP control - possible RVO/CME component to macular edema OD - monitor  5. Mixed Cataract OU - The symptoms of cataract, surgical options, and treatments and risks were discussed with patient. - discussed diagnosis and progression - monitor  Ophthalmic Meds Ordered this visit:  Meds ordered this encounter  Medications   Bevacizumab (AVASTIN) SOLN 1.25 mg     Return in about 4 weeks (around 12/27/2022) for f/u NPDR OU, DFE, OCT.  There are no Patient Instructions on file for this visit.  Explained the diagnoses, plan, and follow up with the patient and they expressed understanding.  Patient expressed understanding of the importance of proper follow up care.   This document serves as a record of services personally performed by Karie Chimera, MD, PhD. It was created on their behalf by De Blanch, an ophthalmic technician. The creation of this record is the provider's dictation and/or activities during the visit.    Electronically signed by: De Blanch, OA, 11/29/22  5:33 PM  This document serves as a record of  services personally performed by Karie Chimera, MD, PhD. It was created on their behalf by Glee Arvin. Manson Passey, OA an ophthalmic technician. The creation of this record is the provider's dictation and/or activities during the visit.    Electronically signed by: Glee Arvin. Manson Passey, OA @TODAY @ 5:33 PM  Karie Chimera, M.D., Ph.D. Diseases & Surgery of the  Retina and Vitreous [facility]  I have reviewed the above documentation for accuracy and completeness, and I agree with the above. Karie Chimera, M.D., Ph.D. 11/29/22 5:40 PM  Abbreviations: M myopia (nearsighted); A astigmatism; H hyperopia (farsighted); P presbyopia; Mrx spectacle prescription;  CTL contact lenses; OD right eye; OS left eye; OU both eyes  XT exotropia; ET esotropia; PEK punctate epithelial keratitis; PEE punctate epithelial erosions; DES dry eye syndrome; MGD meibomian gland dysfunction; ATs artificial tears; PFAT's preservative free artificial tears; NSC nuclear sclerotic cataract; PSC posterior subcapsular cataract; ERM epi-retinal membrane; PVD posterior vitreous detachment; RD retinal detachment; DM diabetes mellitus; DR diabetic retinopathy; NPDR non-proliferative diabetic retinopathy; PDR proliferative diabetic retinopathy; CSME clinically significant macular edema; DME diabetic macular edema; dbh dot blot hemorrhages; CWS cotton wool spot; POAG primary open angle glaucoma; C/D cup-to-disc ratio; HVF humphrey visual field; GVF goldmann visual field; OCT optical coherence tomography; IOP intraocular pressure; BRVO Branch retinal vein occlusion; CRVO central retinal vein occlusion; CRAO central retinal artery occlusion; BRAO branch retinal artery occlusion; RT retinal tear; SB scleral buckle; PPV pars plana vitrectomy; VH Vitreous hemorrhage; PRP panretinal laser photocoagulation; IVK intravitreal kenalog; VMT vitreomacular traction; MH Macular hole;  NVD neovascularization of the disc; NVE neovascularization elsewhere; AREDS age related eye disease study; ARMD age related macular degeneration; POAG primary open angle glaucoma; EBMD epithelial/anterior basement membrane dystrophy; ACIOL anterior chamber intraocular lens; IOL intraocular lens; PCIOL posterior chamber intraocular lens; Phaco/IOL phacoemulsification with intraocular lens placement; PRK  photorefractive keratectomy; LASIK laser assisted in situ keratomileusis; HTN hypertension; DM diabetes mellitus; COPD chronic obstructive pulmonary disease

## 2022-11-19 ENCOUNTER — Other Ambulatory Visit: Payer: Self-pay | Admitting: Nurse Practitioner

## 2022-11-19 DIAGNOSIS — E119 Type 2 diabetes mellitus without complications: Secondary | ICD-10-CM

## 2022-11-23 DIAGNOSIS — E1142 Type 2 diabetes mellitus with diabetic polyneuropathy: Secondary | ICD-10-CM | POA: Diagnosis not present

## 2022-11-23 DIAGNOSIS — B351 Tinea unguium: Secondary | ICD-10-CM | POA: Diagnosis not present

## 2022-11-23 DIAGNOSIS — L603 Nail dystrophy: Secondary | ICD-10-CM | POA: Diagnosis not present

## 2022-11-23 DIAGNOSIS — M79676 Pain in unspecified toe(s): Secondary | ICD-10-CM | POA: Diagnosis not present

## 2022-11-23 DIAGNOSIS — L84 Corns and callosities: Secondary | ICD-10-CM | POA: Diagnosis not present

## 2022-11-29 ENCOUNTER — Encounter (INDEPENDENT_AMBULATORY_CARE_PROVIDER_SITE_OTHER): Payer: Self-pay | Admitting: Ophthalmology

## 2022-11-29 ENCOUNTER — Ambulatory Visit (INDEPENDENT_AMBULATORY_CARE_PROVIDER_SITE_OTHER): Payer: Medicare HMO | Admitting: Ophthalmology

## 2022-11-29 DIAGNOSIS — I10 Essential (primary) hypertension: Secondary | ICD-10-CM

## 2022-11-29 DIAGNOSIS — E113313 Type 2 diabetes mellitus with moderate nonproliferative diabetic retinopathy with macular edema, bilateral: Secondary | ICD-10-CM

## 2022-11-29 DIAGNOSIS — H25813 Combined forms of age-related cataract, bilateral: Secondary | ICD-10-CM

## 2022-11-29 DIAGNOSIS — H35033 Hypertensive retinopathy, bilateral: Secondary | ICD-10-CM

## 2022-11-29 DIAGNOSIS — Z7984 Long term (current) use of oral hypoglycemic drugs: Secondary | ICD-10-CM | POA: Diagnosis not present

## 2022-11-29 MED ORDER — BEVACIZUMAB CHEMO INJECTION 1.25MG/0.05ML SYRINGE FOR KALEIDOSCOPE
1.2500 mg | INTRAVITREAL | Status: AC | PRN
Start: 2022-11-29 — End: 2022-11-29
  Administered 2022-11-29: 1.25 mg via INTRAVITREAL

## 2022-12-13 ENCOUNTER — Ambulatory Visit (INDEPENDENT_AMBULATORY_CARE_PROVIDER_SITE_OTHER): Payer: Medicare HMO | Admitting: Nurse Practitioner

## 2022-12-13 ENCOUNTER — Encounter: Payer: Self-pay | Admitting: Nurse Practitioner

## 2022-12-13 VITALS — BP 109/65 | HR 72 | Temp 98.0°F | Resp 20 | Ht 70.0 in | Wt 178.0 lb

## 2022-12-13 DIAGNOSIS — E119 Type 2 diabetes mellitus without complications: Secondary | ICD-10-CM | POA: Diagnosis not present

## 2022-12-13 DIAGNOSIS — Z7984 Long term (current) use of oral hypoglycemic drugs: Secondary | ICD-10-CM

## 2022-12-13 DIAGNOSIS — W57XXXA Bitten or stung by nonvenomous insect and other nonvenomous arthropods, initial encounter: Secondary | ICD-10-CM | POA: Diagnosis not present

## 2022-12-13 DIAGNOSIS — S30860A Insect bite (nonvenomous) of lower back and pelvis, initial encounter: Secondary | ICD-10-CM | POA: Diagnosis not present

## 2022-12-13 MED ORDER — DOXYCYCLINE HYCLATE 100 MG PO TABS
100.0000 mg | ORAL_TABLET | Freq: Two times a day (BID) | ORAL | 0 refills | Status: DC
Start: 2022-12-13 — End: 2022-12-31

## 2022-12-13 NOTE — Patient Instructions (Signed)
Tick Bite Information, Adult  Ticks are insects that draw blood for food. They climb onto people and animals that brush against the leaves and grasses that they live in. They then bite and attach to the skin. Most ticks are harmless, but some ticks may carry germs that can cause disease. These germs are spread to a person through a bite. To lower your risk of getting a disease from a tick bite, make sure you: Take steps to prevent tick bites. Check for ticks after being outdoors where ticks live. Watch for symptoms of disease if a tick attached to you or if you think a tick bit you. How can I prevent tick bites? Take these steps to help prevent tick bites when you go outdoors in an area where ticks live: Before you go outdoors: Wear long sleeves and long pants to protect your skin from ticks. Wear light-colored clothing so you can see ticks easier. Tuck your pant legs into your socks. Apply insect repellent that has DEET (20% or higher), picaridin, or IR3535 in it to the following areas: Any bare skin. Avoid areas around the eyes and mouth. Edges of clothing, like the top of your boots, the bottom of your pant legs, and your sleeve cuffs. Consider applying an insect repellant that contains permethrin. Follow the instructions on the label. Do not apply permethrin directly to the skin. Instead, apply to the following areas: Clothing and shoes. Outdoor gear and tents. When you are outdoors: Avoid walking through areas with long grass. If you are walking on a trail, stay in the middle of the trail so your skin, hair, and clothing do not touch the bushes. Check for ticks on your clothing, hair, and skin often while you are outdoors. Check again before you go inside. When you go indoors: Check your clothing for ticks. Tumble dry clothes in a dryer on high heat for at least 10 minutes. If clothes are damp, additional time may be needed. If clothes require washing, use hot water. Check your gear and  pets. Shower soon after being outdoors. Check your body for ticks. Do a full body check using a mirror. Be sure to check your scalp, neck, armpits, waist, groin, and joint areas. These are the spots where ticks attach themselves most often. What is the best way to remove a tick?  Remove the tick as soon as possible. Removing it can prevent germs from passing to your body. Do not remove the tick with your bare fingers. Do not try to remove a tick with heat, alcohol, petroleum jelly, or fingernail polish. These things can cause the tick to salivate and regurgitate into your bloodstream, increasing your risk of getting a disease. To remove a tick that is crawling on your skin: Go outside and brush the tick off. Use tape or a lint roller. To remove a tick that is attached to your skin: Wash your hands. If you have gloves, put them on. Use a fine-tipped tweezer, curved forceps, or a tick-removal tool to gently grasp the tick as close to your skin and the tick's head as possible. Gently pull with a steady, upward, and even pressure until the tick lets go. While removing the tick: Take care to keep the tick's head attached to its body. Do not twist or jerk the tick. This can make the tick's head or mouth parts break off and stay in your skin. If this happens, try to remove the mouth parts with tweezers. If you cannot remove them, leave   the area alone and let the skin heal. Do not squeeze or crush the tick's body. This could force disease-carrying fluids from the tick into your body. What should I do after removing a tick? Clean the bite area and your hands with soap and water, rubbing alcohol, or an iodine scrub. If an antiseptic cream or ointment is available, put a small amount on the bite area. Wash and disinfect any tools that you used to remove the tick. How should I dispose of a tick? To dispose of a live tick, use one of these methods: Place it in rubbing alcohol. Place it in a sealed bag  or container, and throw it away. Wrap it tightly in tape, and throw it away. Flush it down the toilet. Where to find more information Centers for Disease Control and Prevention: cdc.gov/ticks U.[place]. Environmental Protection Agency: epa.gov/insect-repellents Contact a health care provider if: You have symptoms of a disease after a tick bite. Symptoms of a tick-borne disease can occur from moments after the tick bites to 30 days after a tick is removed. Symptoms include: Fever or chills. A red rash that makes a circle (bull's-eye rash) in the bite area. Redness and swelling in the bite area. Headache or stiff neck. Muscle, joint, or bone pain. Abnormal tiredness. Numbness in your legs or trouble walking or moving your legs. Tender or swollen lymph glands. Abdominal pain, vomiting, diarrhea, or weight loss. Get help right away if: You are not able to remove a tick. You have muscle weakness or paralysis. Your symptoms get worse or you experience new symptoms. You find an engorged tick on your skin and you are in an area where there is a higher risk of disease from ticks. Summary Ticks may carry germs that can spread to a person through a bite. These germs can cause disease. Wear protective clothing and use insect repellent to prevent tick bites. Follow the instructions on the label. If you find a tick on your body, remove it as soon as possible. If the tick is attached, do not try to remove it with heat, alcohol, petroleum jelly, or fingernail polish. If you have symptoms of a disease after being bitten by a tick, contact a health care provider. This information is not intended to replace advice given to you by your health care provider. Make sure you discuss any questions you have with your health care provider. Document Revised: 09/14/2021 Document Reviewed: 09/14/2021 Elsevier Patient Education  2024 Elsevier Inc.  

## 2022-12-13 NOTE — Progress Notes (Signed)
Subjective:    Patient ID: Arthur Torres, male    DOB: 1950/07/15, 72 y.o.   MRN: [id number]   Chief Complaint: Tick on back and Blood sugar has been dropping   HPI   Patient come sin with 2 complaints: - tick bite- found on his back this morning and he could not reach it to get it off. Has probably been on there since Saturday  - added glipizide to diabetic meds several months ago. Has had blood sugar dropping into 50's  Patient Active Problem List   Diagnosis Date Noted   Allergic rhinitis due to pollen 10/22/2015   BMI 27.0-27.9,adult 04/17/2015   H/O prostate cancer 10/04/2014   History of right coronary artery stent placement 10/06/2012   Hyperlipidemia with target LDL less than 100 10/03/2012   Essential hypertension, benign 10/03/2012   Diabetes (HCC) 10/03/2012       Review of Systems  Constitutional:  Negative for diaphoresis.  Eyes:  Negative for pain.  Respiratory:  Negative for shortness of breath.   Cardiovascular:  Negative for chest pain, palpitations and leg swelling.  Gastrointestinal:  Negative for abdominal pain.  Endocrine: Negative for polydipsia.  Skin:  Negative for rash.  Neurological:  Negative for dizziness, weakness and headaches.  Hematological:  Does not bruise/bleed easily.  All other systems reviewed and are negative.      Objective:   Physical Exam Vitals and nursing note reviewed.  Constitutional:      Appearance: Normal appearance. He is well-developed.  Neck:     Thyroid: No thyroid mass or thyromegaly.     Vascular: No carotid bruit or JVD.     Trachea: Phonation normal.  Cardiovascular:     Rate and Rhythm: Normal rate and regular rhythm.  Pulmonary:     Effort: Pulmonary effort is normal. No respiratory distress.     Breath sounds: Normal breath sounds.  Abdominal:     General: Bowel sounds are normal.     Palpations: Abdomen is soft.     Tenderness: There is no abdominal tenderness.  Musculoskeletal:         General: Normal range of motion.     Cervical back: Normal range of motion and neck supple.  Lymphadenopathy:     Cervical: No cervical adenopathy.  Skin:    General: Skin is warm and dry.     Comments: Tick removed from mid lower back- no erythema to area  Neurological:     Mental Status: He is alert and oriented to person, place, and time.  Psychiatric:        Behavior: Behavior normal.        Thought Content: Thought content normal.        Judgment: Judgment normal.    BP 109/65   Pulse 72   Temp 98 F (36.7 C) (Temporal)   Resp 20   Ht 5\' 10"  (1.778 m)   Wt 178 lb (80.7 kg)   SpO2 97%   BMI 25.54 kg/m         Assessment & Plan:   Arthur Torres in today with chief complaint of Tick on back and Blood sugar has been dropping   1. Tick bite of lower back, initial encounter Doxycycline bid Watch for signs of infection  2. Type 2 diabetes mellitus without complication, without long-term current use of insulin (HCC) Change glipizide to 1/2 tablet BID and let me know if blood sugars still dropping.    The above assessment and  management plan was discussed with the patient. The patient verbalized understanding of and has agreed to the management plan. Patient is aware to call the clinic if symptoms persist or worsen. Patient is aware when to return to the clinic for a follow-up visit. Patient educated on when it is appropriate to go to the emergency department.   Arthur Torres Done, FNP

## 2022-12-17 ENCOUNTER — Other Ambulatory Visit: Payer: Self-pay | Admitting: Nurse Practitioner

## 2022-12-17 DIAGNOSIS — E785 Hyperlipidemia, unspecified: Secondary | ICD-10-CM

## 2022-12-21 NOTE — Progress Notes (Signed)
[facility] - Clinic Note  12/27/2022     CHIEF COMPLAINT Patient presents for Retina Follow Up   HISTORY OF PRESENT ILLNESS: Arthur Torres is a 72 y.o. male who presents to the clinic today for:   HPI     Retina Follow Up   Patient presents with  Diabetic Retinopathy.  In both eyes.  This started 4 weeks ago.  I, the attending physician,  performed the HPI with the patient and updated documentation appropriately.        Comments   Patient here for 4 weeks retina follow up for NPDR OU. Patient states vision no better. Started getting distorted OD. Only improvement is don't see blotchy stain anymore. Not using any drops.      Last edited by Rennis Chris, MD on 12/27/2022  2:55 PM.     Pts wife states pt has been complaining about his vision getting worse, pt states "things have begun to distort"  Referring physician: Bennie Pierini, FNP [address] [place],  [place] [address]  HISTORICAL INFORMATION:   Selected notes from the MEDICAL RECORD NUMBER Referred by Dr. Despina Arias for DM exam LEE:  Ocular Hx- PMH-    CURRENT MEDICATIONS: No current outpatient medications on file. (Ophthalmic Drugs)   No current facility-administered medications for this visit. (Ophthalmic Drugs)   Current Outpatient Medications (Other)  Medication Sig   aspirin 325 MG EC tablet Take 325 mg by mouth daily.   doxycycline (VIBRA-TABS) 100 MG tablet Take 1 tablet (100 mg total) by mouth 2 (two) times daily. 1 po bid   glipiZIDE (GLUCOTROL) 5 MG tablet TAKE 1 TABLET (5 MG TOTAL) BY MOUTH TWICE A DAY BEFORE MEALS   metoprolol succinate (TOPROL-XL) 50 MG 24 hr tablet Take 1 tablet (50 mg total) by mouth daily. Take with or immediately following a meal.   pantoprazole (PROTONIX) 40 MG tablet Take 1 tablet (40 mg total) by mouth daily.   rosuvastatin (CRESTOR) 10 MG tablet TAKE 1 TABLET BY MOUTH EVERY DAY   sitaGLIPtin-metformin (JANUMET) 50-500 MG tablet Take 1 tablet  by mouth 2 (two) times daily with a meal.   No current facility-administered medications for this visit. (Other)   REVIEW OF SYSTEMS: ROS   Positive for: Endocrine, Eyes Last edited by Laddie Aquas, COA on 12/27/2022  1:11 PM.      ALLERGIES Allergies  Allergen Reactions   Penicillins    Zocor [Simvastatin]    PAST MEDICAL HISTORY Past Medical History:  Diagnosis Date   CAD (coronary artery disease)    1999 occluded LAD treated with PCI and stenting, 80% stenosis of the first diagonal feeling angioplasty, 60-70% second stenosis, 50% ostial left main stenosis, 25% right coronary artery stenosis, 50% ostial PDA stenosis.   Cancer [facility])    prostate (treated with radiation)   COVID-19    Diabetes mellitus without complication (HCC)    Hyperlipidemia    Hypertension    Joint pain    Past Surgical History:  Procedure Laterality Date   CARDIAC CATHETERIZATION     COLON SURGERY     hemrrhoids   CORONARY ANGIOPLASTY WITH STENT PLACEMENT     FAMILY HISTORY Family History  Problem Relation Age of Onset   Diabetes Mother    Stroke Mother    Cancer Father        skin, prostate, stomach   Healthy Daughter    Healthy Son    Asthma Son  SOCIAL HISTORY Social History   Tobacco Use   Smoking status: Former    Types: Cigarettes    Quit date: 03/28/1998    Years since quitting: 24.7   Smokeless tobacco: Never  Vaping Use   Vaping Use: Never used  Substance Use Topics   Alcohol use: No   Drug use: No       OPHTHALMIC EXAM:  Base Eye Exam     Visual Acuity (Snellen - Linear)       Right Left   Dist cc 20/50 -1 20/20 -1   Dist ph cc NI     Correction: Glasses         Tonometry (Tonopen, 1:08 PM)       Right Left   Pressure 14 13         Pupils       Dark Light Shape React APD   Right 3 2 Round Brisk None   Left 3 2 Round Brisk None         Visual Fields (Counting fingers)       Left Right    Full Full         Extraocular Movement        Right Left    Full, Ortho Full, Ortho         Neuro/Psych     Oriented x3: Yes   Mood/Affect: Normal         Dilation     Both eyes: 1.0% Mydriacyl, 2.5% Phenylephrine @ 1:08 PM           Slit Lamp and Fundus Exam     Slit Lamp Exam       Right Left   Lids/Lashes Dermatochalasis - upper lid, Meibomian gland dysfunction Dermatochalasis - upper lid, Meibomian gland dysfunction   Conjunctiva/Sclera White and quiet nasal and temporal pinguecula   Cornea arcus Trace tear film debris   Anterior Chamber deep and clear deep and clear   Iris Round and dilated, No NVI Round and dilated, No NVI   Lens 2+ Nuclear sclerosis with brunescence, 2+ Cortical cataract 2+ Nuclear sclerosis with brunescence, 2+ Cortical cataract   Anterior Vitreous mild syneresis mild syneresis         Fundus Exam       Right Left   Disc Pink and Sharp, +PPP Pink and Sharp, mild PPP   C/D Ratio 0.2 0.2   Macula Blunted foveal reflex, central edema, scattered MA/DBH greatest centrally -- slightly improved Flat, Blunted foveal reflex, scattered MA   Vessels attenuated, Tortuous attenuated, Tortuous   Periphery Attached, 360 MA / DBH -- improved Attached, rare MA           Refraction     Wearing Rx       Sphere Cylinder Axis Add   Right -1.50 +0.75 114 +2.50   Left -1.50 +0.25 163 +2.50            IMAGING AND PROCEDURES  Imaging and Procedures for 12/27/2022  OCT, Retina - OU - Both Eyes       Right Eye Quality was good. Central Foveal Thickness: 380. Progression has improved. Findings include no SRF, abnormal foveal contour, intraretinal hyper-reflective material, intraretinal fluid, vitreomacular adhesion (Persistent central IRF/IRHM -- slightly improved).   Left Eye Quality was good. Central Foveal Thickness: 303. Progression has been stable. Findings include normal foveal contour, no IRF, no SRF.   Notes *Images captured and stored on drive  Diagnosis / Impression:  OD: +DME/CME - Persistent central IRF/IRHM -- slightly improved OS: NFP; no IRF/SRF  Clinical management:  See below  Abbreviations: NFP - Normal foveal profile. CME - cystoid macular edema. PED - pigment epithelial detachment. IRF - intraretinal fluid. SRF - subretinal fluid. EZ - ellipsoid zone. ERM - epiretinal membrane. ORA - outer retinal atrophy. ORT - outer retinal tubulation. SRHM - subretinal hyper-reflective material. IRHM - intraretinal hyper-reflective material      Intravitreal Injection, Pharmacologic Agent - OD - Right Eye       Time Out 12/27/2022. 1:46 PM. Confirmed correct patient, procedure, site, and patient consented.   Anesthesia Topical anesthesia was used. Anesthetic medications included Lidocaine 4%, Proparacaine 0.5%.   Procedure Preparation included 5% betadine to ocular surface, eyelid speculum. A (32g) needle was used.   Injection: 1.25 mg Bevacizumab 1.25mg /0.47ml   Route: Intravitreal, Site: Right Eye   NDC: [id number], Lot: [id number], Expiration date: 03/25/2023   Post-op Post injection exam found visual acuity of at least counting fingers. The patient tolerated the procedure well. There were no complications. The patient received written and verbal post procedure care education. Post injection medications were not given.            ASSESSMENT/PLAN:    ICD-10-CM   1. Moderate nonproliferative diabetic retinopathy of both eyes with macular edema associated with type 2 diabetes mellitus (HCC)  E11.3313 OCT, Retina - OU - Both Eyes    Intravitreal Injection, Pharmacologic Agent - OD - Right Eye    Bevacizumab (AVASTIN) SOLN 1.25 mg    2. Long term (current) use of oral hypoglycemic drugs  Z79.84     3. Essential hypertension  I10     4. Hypertensive retinopathy of both eyes  H35.033     5. Combined forms of age-related cataract of both eyes  H25.813      1,2. Moderate Non-proliferative diabetic retinopathy, both eyes (OD>OS)  - A1c 6.7  on 4.1.24 - exam shows scattered MA OU (OD > OS) - s/p IVA OD #1 (04.08.24), #2 (05.06.24), #3 (06.03.24) - FA (04.08.24) shows OD: scattered leaking MA greatest centrally; no NV; Large area of vascular non-perfusion temporal periphery with +perivascular leakage; AV:[id number] MA with late leakage, no NV  - OCT OD: +DME/CME - Mild interval improvement in central IRF/IRHM; OS: NFP; no IRF/SRF - recommend IVA OD #4 today, 07.01.24 for DME - pt wishes to proceed - RBA of procedure discussed, questions answered - IVA informed consent obtained and signed, 04.08.24 (OD) - see procedure note - f/u in 4 wks -- DFE/OCT, possible injection  3,4. Hypertensive retinopathy OU - discussed importance of tight BP control - possible RVO/CME component to macular edema OD - monitor  5. Mixed Cataract OU - The symptoms of cataract, surgical options, and treatments and risks were discussed with patient. - discussed diagnosis and progression - monitor  Ophthalmic Meds Ordered this visit:  Meds ordered this encounter  Medications   Bevacizumab (AVASTIN) SOLN 1.25 mg     Return in about 4 weeks (around 01/24/2023) for f/u NPDR OU, DFE, OCT.  There are no Patient Instructions on file for this visit.  Explained the diagnoses, plan, and follow up with the patient and they expressed understanding.  Patient expressed understanding of the importance of proper follow up care.   This document serves as a record of services personally performed by Karie Chimera, MD, PhD. It was created on their behalf by Glee Arvin. Manson Passey, OA an ophthalmic technician. The creation of  this record is the provider's dictation and/or activities during the visit.    Electronically signed by: Glee Arvin. Manson Passey, [place] 06.25.2024 2:56 PM   Karie Chimera, M.D., Ph.D. Diseases & Surgery of the Retina and Vitreous [facility]  I have reviewed the above documentation for accuracy and completeness, and I agree with  the above. Karie Chimera, M.D., Ph.D. 12/27/22 2:59 PM   Abbreviations: M myopia (nearsighted); A astigmatism; H hyperopia (farsighted); P presbyopia; Mrx spectacle prescription;  CTL contact lenses; OD right eye; OS left eye; OU both eyes  XT exotropia; ET esotropia; PEK punctate epithelial keratitis; PEE punctate epithelial erosions; DES dry eye syndrome; MGD meibomian gland dysfunction; ATs artificial tears; PFAT's preservative free artificial tears; NSC nuclear sclerotic cataract; PSC posterior subcapsular cataract; ERM epi-retinal membrane; PVD posterior vitreous detachment; RD retinal detachment; DM diabetes mellitus; DR diabetic retinopathy; NPDR non-proliferative diabetic retinopathy; PDR proliferative diabetic retinopathy; CSME clinically significant macular edema; DME diabetic macular edema; dbh dot blot hemorrhages; CWS cotton wool spot; POAG primary open angle glaucoma; C/D cup-to-disc ratio; HVF humphrey visual field; GVF goldmann visual field; OCT optical coherence tomography; IOP intraocular pressure; BRVO Branch retinal vein occlusion; CRVO central retinal vein occlusion; CRAO central retinal artery occlusion; BRAO branch retinal artery occlusion; RT retinal tear; SB scleral buckle; PPV pars plana vitrectomy; VH Vitreous hemorrhage; PRP panretinal laser photocoagulation; IVK intravitreal kenalog; VMT vitreomacular traction; MH Macular hole;  NVD neovascularization of the disc; NVE neovascularization elsewhere; AREDS age related eye disease study; ARMD age related macular degeneration; POAG primary open angle glaucoma; EBMD epithelial/anterior basement membrane dystrophy; ACIOL anterior chamber intraocular lens; IOL intraocular lens; PCIOL posterior chamber intraocular lens; Phaco/IOL phacoemulsification with intraocular lens placement; PRK photorefractive keratectomy; LASIK laser assisted in situ keratomileusis; HTN hypertension; DM diabetes mellitus; COPD chronic obstructive pulmonary  disease

## 2022-12-27 ENCOUNTER — Encounter (INDEPENDENT_AMBULATORY_CARE_PROVIDER_SITE_OTHER): Payer: Self-pay | Admitting: Ophthalmology

## 2022-12-27 ENCOUNTER — Ambulatory Visit (INDEPENDENT_AMBULATORY_CARE_PROVIDER_SITE_OTHER): Payer: Medicare HMO | Admitting: Ophthalmology

## 2022-12-27 DIAGNOSIS — I10 Essential (primary) hypertension: Secondary | ICD-10-CM | POA: Diagnosis not present

## 2022-12-27 DIAGNOSIS — Z7984 Long term (current) use of oral hypoglycemic drugs: Secondary | ICD-10-CM

## 2022-12-27 DIAGNOSIS — E113313 Type 2 diabetes mellitus with moderate nonproliferative diabetic retinopathy with macular edema, bilateral: Secondary | ICD-10-CM

## 2022-12-27 DIAGNOSIS — H35033 Hypertensive retinopathy, bilateral: Secondary | ICD-10-CM | POA: Diagnosis not present

## 2022-12-27 DIAGNOSIS — H25813 Combined forms of age-related cataract, bilateral: Secondary | ICD-10-CM

## 2022-12-27 MED ORDER — BEVACIZUMAB CHEMO INJECTION 1.25MG/0.05ML SYRINGE FOR KALEIDOSCOPE
1.2500 mg | INTRAVITREAL | Status: AC | PRN
Start: 2022-12-27 — End: 2022-12-27
  Administered 2022-12-27: 1.25 mg via INTRAVITREAL

## 2022-12-31 ENCOUNTER — Ambulatory Visit (INDEPENDENT_AMBULATORY_CARE_PROVIDER_SITE_OTHER): Payer: Medicare HMO | Admitting: Nurse Practitioner

## 2022-12-31 ENCOUNTER — Encounter: Payer: Self-pay | Admitting: Nurse Practitioner

## 2022-12-31 VITALS — BP 114/63 | HR 68 | Temp 97.8°F | Resp 20 | Ht 70.0 in | Wt 179.0 lb

## 2022-12-31 DIAGNOSIS — Z7984 Long term (current) use of oral hypoglycemic drugs: Secondary | ICD-10-CM

## 2022-12-31 DIAGNOSIS — I10 Essential (primary) hypertension: Secondary | ICD-10-CM | POA: Diagnosis not present

## 2022-12-31 DIAGNOSIS — Z8546 Personal history of malignant neoplasm of prostate: Secondary | ICD-10-CM

## 2022-12-31 DIAGNOSIS — E1169 Type 2 diabetes mellitus with other specified complication: Secondary | ICD-10-CM | POA: Diagnosis not present

## 2022-12-31 DIAGNOSIS — E119 Type 2 diabetes mellitus without complications: Secondary | ICD-10-CM

## 2022-12-31 DIAGNOSIS — Z1211 Encounter for screening for malignant neoplasm of colon: Secondary | ICD-10-CM

## 2022-12-31 DIAGNOSIS — Z6827 Body mass index (BMI) 27.0-27.9, adult: Secondary | ICD-10-CM

## 2022-12-31 DIAGNOSIS — K219 Gastro-esophageal reflux disease without esophagitis: Secondary | ICD-10-CM | POA: Diagnosis not present

## 2022-12-31 DIAGNOSIS — E785 Hyperlipidemia, unspecified: Secondary | ICD-10-CM | POA: Diagnosis not present

## 2022-12-31 LAB — CMP14+EGFR
ALT: 20 IU/L (ref 0–44)
AST: 18 IU/L (ref 0–40)
Albumin: 4.3 g/dL (ref 3.9–4.9)
Alkaline Phosphatase: 74 IU/L (ref 44–121)
BUN/Creatinine Ratio: 29 — ABNORMAL HIGH (ref 10–24)
BUN: 30 mg/dL — ABNORMAL HIGH (ref 8–27)
Bilirubin Total: 0.5 mg/dL (ref 0.0–1.2)
CO2: 23 mmol/L (ref 20–29)
Calcium: 9.4 mg/dL (ref 8.6–10.2)
Chloride: 101 mmol/L (ref 96–106)
Creatinine, Ser: 1.02 mg/dL (ref 0.76–1.27)
Globulin, Total: 2.5 g/dL (ref 1.5–4.5)
Glucose: 195 mg/dL — ABNORMAL HIGH (ref 70–99)
Potassium: 4.5 mmol/L (ref 3.5–5.2)
Sodium: 138 mmol/L (ref 134–144)
Total Protein: 6.8 g/dL (ref 6.0–8.5)
eGFR: 80 mL/min/1.73

## 2022-12-31 LAB — LIPID PANEL
Chol/HDL Ratio: 2.9 ratio (ref 0.0–5.0)
Cholesterol, Total: 129 mg/dL (ref 100–199)
HDL: 44 mg/dL
LDL Chol Calc (NIH): 67 mg/dL (ref 0–99)
Triglycerides: 97 mg/dL (ref 0–149)
VLDL Cholesterol Cal: 18 mg/dL (ref 5–40)

## 2022-12-31 LAB — CBC WITH DIFFERENTIAL/PLATELET
Basophils Absolute: 0.1 x10E3/uL (ref 0.0–0.2)
Basos: 1 %
EOS (ABSOLUTE): 0.2 x10E3/uL (ref 0.0–0.4)
Eos: 3 %
Hematocrit: 43.6 % (ref 37.5–51.0)
Hemoglobin: 14 g/dL (ref 13.0–17.7)
Immature Grans (Abs): 0 x10E3/uL (ref 0.0–0.1)
Immature Granulocytes: 0 %
Lymphocytes Absolute: 1.5 x10E3/uL (ref 0.7–3.1)
Lymphs: 30 %
MCH: 28.1 pg (ref 26.6–33.0)
MCHC: 32.1 g/dL (ref 31.5–35.7)
MCV: 87 fL (ref 79–97)
Monocytes Absolute: 0.3 x10E3/uL (ref 0.1–0.9)
Monocytes: 6 %
Neutrophils Absolute: 3.1 x10E3/uL (ref 1.4–7.0)
Neutrophils: 60 %
Platelets: 144 x10E3/uL — ABNORMAL LOW (ref 150–450)
RBC: 4.99 x10E6/uL (ref 4.14–5.80)
RDW: 13.2 % (ref 11.6–15.4)
WBC: 5.2 x10E3/uL (ref 3.4–10.8)

## 2022-12-31 LAB — BAYER DCA HB A1C WAIVED: HB A1C (BAYER DCA - WAIVED): 5.9 % — ABNORMAL HIGH (ref 4.8–5.6)

## 2022-12-31 MED ORDER — GLIPIZIDE 5 MG PO TABS: ORAL_TABLET | ORAL | 1 refills | Status: DC

## 2022-12-31 MED ORDER — METOPROLOL SUCCINATE ER 50 MG PO TB24
50.0000 mg | ORAL_TABLET | Freq: Every day | ORAL | 1 refills | Status: DC
Start: 2022-12-31 — End: 2023-07-12

## 2022-12-31 MED ORDER — JANUMET 50-500 MG PO TABS
1.0000 | ORAL_TABLET | Freq: Two times a day (BID) | ORAL | 1 refills | Status: DC
Start: 2022-12-31 — End: 2023-07-12

## 2022-12-31 MED ORDER — PANTOPRAZOLE SODIUM 40 MG PO TBEC
40.0000 mg | DELAYED_RELEASE_TABLET | Freq: Every day | ORAL | 1 refills | Status: DC
Start: 2022-12-31 — End: 2023-07-12

## 2022-12-31 NOTE — Progress Notes (Signed)
Subjective:    Patient ID: Arthur Torres, male    DOB: January 12, 1951, 72 y.o.   MRN: [id number]   Chief Complaint: medical management of chronic issues     HPI:  Arthur Torres is a 72 y.o. who identifies as a male who was assigned male at birth.   Social history: Lives with: wife Work history: Curator   Comes in today for follow up of the following chronic medical issues:  1. Essential hypertension, benign No c/o chest pain, sob or head ahce. Does not check blood pressure at home. BP Readings from Last 3 Encounters:  12/13/22 109/65  09/27/22 130/69  08/27/22 (!) 142/81     2. Hyperlipidemia associated with type 2 diabetes mellitus (HCC) Does not watch diet very closely and does no exercise Lab Results  Component Value Date   CHOL 114 08/27/2022   HDL 39 (L) 08/27/2022   LDLCALC 48 08/27/2022   TRIG 163 (H) 08/27/2022   CHOLHDL 2.9 08/27/2022     3. Diabetes mellitus treated with oral medication (HCC) Fasting blood sugars are running around 100-130 when he checks them. He does not check them very often. Lab Results  Component Value Date   HGBA1C 6.7 (H) 09/27/2022     4. H/O prostate cancer No voiding issues Lab Results  Component Value Date   PSA1 <0.1 08/27/2022   PSA1 <0.1 07/28/2021   PSA1 0.1 08/21/2019   PSA 0.2 09/03/2013       5. BMI 27.0-27.9,adult No recent weight changes Wt Readings from Last 3 Encounters:  12/31/22 179 lb (81.2 kg)  12/13/22 178 lb (80.7 kg)  09/27/22 181 lb (82.1 kg)   BMI Readings from Last 3 Encounters:  12/31/22 25.68 kg/m  12/13/22 25.54 kg/m  09/27/22 25.97 kg/m     New complaints: None today  Allergies  Allergen Reactions   Penicillins    Zocor [Simvastatin]    Outpatient Encounter Medications as of 12/31/2022  Medication Sig   aspirin 325 MG EC tablet Take 325 mg by mouth daily.   doxycycline (VIBRA-TABS) 100 MG tablet Take 1 tablet (100 mg total) by mouth 2 (two) times daily. 1 po bid    glipiZIDE (GLUCOTROL) 5 MG tablet TAKE 1 TABLET (5 MG TOTAL) BY MOUTH TWICE A DAY BEFORE MEALS   metoprolol succinate (TOPROL-XL) 50 MG 24 hr tablet Take 1 tablet (50 mg total) by mouth daily. Take with or immediately following a meal.   pantoprazole (PROTONIX) 40 MG tablet Take 1 tablet (40 mg total) by mouth daily.   rosuvastatin (CRESTOR) 10 MG tablet TAKE 1 TABLET BY MOUTH EVERY DAY   sitaGLIPtin-metformin (JANUMET) 50-500 MG tablet Take 1 tablet by mouth 2 (two) times daily with a meal.   No facility-administered encounter medications on file as of 12/31/2022.    Past Surgical History:  Procedure Laterality Date   CARDIAC CATHETERIZATION     COLON SURGERY     hemrrhoids   CORONARY ANGIOPLASTY WITH STENT PLACEMENT      Family History  Problem Relation Age of Onset   Diabetes Mother    Stroke Mother    Cancer Father        skin, prostate, stomach   Healthy Daughter    Healthy Son    Asthma Son       Controlled substance contract: n/a     Review of Systems  Constitutional:  Negative for diaphoresis.  Eyes:  Negative for pain.  Respiratory:  Negative for shortness  of breath.   Cardiovascular:  Negative for chest pain, palpitations and leg swelling.  Gastrointestinal:  Negative for abdominal pain.  Endocrine: Negative for polydipsia.  Skin:  Negative for rash.  Neurological:  Negative for dizziness, weakness and headaches.  Hematological:  Does not bruise/bleed easily.  All other systems reviewed and are negative.      Objective:   Physical Exam Vitals and nursing note reviewed.  Constitutional:      Appearance: Normal appearance. He is well-developed.  HENT:     Head: Normocephalic.     Nose: Nose normal.     Mouth/Throat:     Mouth: Mucous membranes are moist.     Pharynx: Oropharynx is clear.  Eyes:     Pupils: Pupils are equal, round, and reactive to light.  Neck:     Thyroid: No thyroid mass or thyromegaly.     Vascular: No carotid bruit or JVD.      Trachea: Phonation normal.  Cardiovascular:     Rate and Rhythm: Normal rate and regular rhythm.  Pulmonary:     Effort: Pulmonary effort is normal. No respiratory distress.     Breath sounds: Normal breath sounds.  Abdominal:     General: Bowel sounds are normal.     Palpations: Abdomen is soft.     Tenderness: There is no abdominal tenderness.  Musculoskeletal:        General: Normal range of motion.     Cervical back: Normal range of motion and neck supple.  Lymphadenopathy:     Cervical: No cervical adenopathy.  Skin:    General: Skin is warm and dry.  Neurological:     Mental Status: He is alert and oriented to person, place, and time.  Psychiatric:        Behavior: Behavior normal.        Thought Content: Thought content normal.        Judgment: Judgment normal.    BP 114/63   Pulse 68   Temp 97.8 F (36.6 C) (Temporal)   Resp 20   Ht 5\' 10"  (1.778 m)   Wt 179 lb (81.2 kg)   SpO2 98%   BMI 25.68 kg/m   Hgba1c        Assessment & Plan:  KASEM HAFELE comes in today with chief complaint of Medical Management of Chronic Issues   Diagnosis and orders addressed:  1. Essential hypertension, benign Low sodium diet - CBC with Differential/Platelet - CMP14+EGFR - metoprolol succinate (TOPROL-XL) 50 MG 24 hr tablet; Take 1 tablet (50 mg total) by mouth daily. Take with or immediately following a meal.  Dispense: 90 tablet; Refill: 1  2. Hyperlipidemia associated with type 2 diabetes mellitus (HCC) Low fat diet - Lipid panel  3. Diabetes mellitus treated with oral medication (HCC) Continue to wtahc carbs in diet - Bayer DCA Hb A1c Waived - glipiZIDE (GLUCOTROL) 5 MG tablet; TAKE 1 TABLET (5 MG TOTAL) BY MOUTH TWICE A DAY BEFORE MEALS  Dispense: 180 tablet; Refill: 1 - sitaGLIPtin-metformin (JANUMET) 50-500 MG tablet; Take 1 tablet by mouth 2 (two) times daily with a meal.  Dispense: 180 tablet; Refill: 1  4. H/O prostate cancer Report any voiding  issues  5. BMI 27.0-27.9,adult Discussed diet and exercise for person with BMI >25 Will recheck weight in 3-6 months   6. Screening for colon cancer - Cologuard  7. Gastroesophageal reflux disease without esophagitis Avoid spicy foods Do not eat 2 hours prior to bedtime  -  pantoprazole (PROTONIX) 40 MG tablet; Take 1 tablet (40 mg total) by mouth daily.  Dispense: 90 tablet; Refill: 1   Labs pending Health Maintenance reviewed Diet and exercise encouraged  Follow up plan: 3 months   Mary-Margaret Daphine Deutscher, FNP

## 2022-12-31 NOTE — Patient Instructions (Signed)

## 2023-01-11 DIAGNOSIS — Z1211 Encounter for screening for malignant neoplasm of colon: Secondary | ICD-10-CM | POA: Diagnosis not present

## 2023-01-11 LAB — COLOGUARD: Cologuard: NEGATIVE

## 2023-01-17 LAB — COLOGUARD: COLOGUARD: NEGATIVE

## 2023-01-18 NOTE — Progress Notes (Signed)
[facility] - Clinic Note  01/24/2023     CHIEF COMPLAINT Patient presents for Retina Follow Up   HISTORY OF PRESENT ILLNESS: Arthur Torres is a 72 y.o. male who presents to the clinic today for:   HPI     Retina Follow Up   Patient presents with  Diabetic Retinopathy.  In both eyes.  Severity is moderate.  Duration of 4 weeks.  Since onset it is stable.  I, the attending physician,  performed the HPI with the patient and updated documentation appropriately.        Comments   Patient states vision the same OU. Last A1c was 5.9, checked a month ago.      Last edited by Rennis Chris, MD on 01/24/2023  5:10 PM.     Patient feels the vision is the same. His A1C has lowered.  Referring physician: Michaelle Copas, MD [phone] [facility] [address],  [place] [address]  HISTORICAL INFORMATION:   Selected notes from the MEDICAL RECORD NUMBER Referred by Dr. Despina Arias for DM exam LEE:  Ocular Hx- PMH-    CURRENT MEDICATIONS: No current outpatient medications on file. (Ophthalmic Drugs)   No current facility-administered medications for this visit. (Ophthalmic Drugs)   Current Outpatient Medications (Other)  Medication Sig   aspirin 325 MG EC tablet Take 325 mg by mouth daily.   glipiZIDE (GLUCOTROL) 5 MG tablet TAKE 1 TABLET (5 MG TOTAL) BY MOUTH TWICE A DAY BEFORE MEALS   metoprolol succinate (TOPROL-XL) 50 MG 24 hr tablet Take 1 tablet (50 mg total) by mouth daily. Take with or immediately following a meal.   pantoprazole (PROTONIX) 40 MG tablet Take 1 tablet (40 mg total) by mouth daily.   rosuvastatin (CRESTOR) 10 MG tablet TAKE 1 TABLET BY MOUTH EVERY DAY   sitaGLIPtin-metformin (JANUMET) 50-500 MG tablet Take 1 tablet by mouth 2 (two) times daily with a meal.   No current facility-administered medications for this visit. (Other)   REVIEW OF SYSTEMS: ROS   Positive for: Endocrine, Eyes Negative for: Constitutional, Gastrointestinal, Neurological, Skin,  Genitourinary, Musculoskeletal, HENT, Cardiovascular, Respiratory, Psychiatric, Allergic/Imm, Heme/Lymph Last edited by Doreene Nest, COT on 01/24/2023  1:36 PM.     ALLERGIES Allergies  Allergen Reactions   Penicillins    Zocor [Simvastatin]    PAST MEDICAL HISTORY Past Medical History:  Diagnosis Date   CAD (coronary artery disease)    1999 occluded LAD treated with PCI and stenting, 80% stenosis of the first diagonal feeling angioplasty, 60-70% second stenosis, 50% ostial left main stenosis, 25% right coronary artery stenosis, 50% ostial PDA stenosis.   Cancer [facility])    prostate (treated with radiation)   COVID-19    Diabetes mellitus without complication (HCC)    Hyperlipidemia    Hypertension    Joint pain    Past Surgical History:  Procedure Laterality Date   CARDIAC CATHETERIZATION     COLON SURGERY     hemrrhoids   CORONARY ANGIOPLASTY WITH STENT PLACEMENT     FAMILY HISTORY Family History  Problem Relation Age of Onset   Diabetes Mother    Stroke Mother    Cancer Father        skin, prostate, stomach   Healthy Daughter    Healthy Son    Asthma Son    SOCIAL HISTORY Social History   Tobacco Use   Smoking status: Former    Current packs/day: 0.00    Types: Cigarettes  Quit date: 03/28/1998    Years since quitting: 24.8   Smokeless tobacco: Never  Vaping Use   Vaping status: Never Used  Substance Use Topics   Alcohol use: No   Drug use: No       OPHTHALMIC EXAM:  Base Eye Exam     Visual Acuity (Snellen - Linear)       Right Left   Dist cc 20/60 -1 20/20 -1   Dist ph cc 20/50 -2     Correction: Glasses         Tonometry (Tonopen, 1:47 PM)       Right Left   Pressure 17 16         Pupils       Dark Light Shape React APD   Right 3 2 Round Brisk None   Left 3 2 Round Brisk None         Visual Fields (Counting fingers)       Left Right    Full Full         Extraocular Movement       Right Left    Full, Ortho  Full, Ortho         Neuro/Psych     Oriented x3: Yes   Mood/Affect: Normal         Dilation     Both eyes: 1.0% Mydriacyl, 2.5% Phenylephrine @ 1:48 PM           Slit Lamp and Fundus Exam     Slit Lamp Exam       Right Left   Lids/Lashes Dermatochalasis - upper lid, Meibomian gland dysfunction Dermatochalasis - upper lid, Meibomian gland dysfunction   Conjunctiva/Sclera White and quiet nasal and temporal pinguecula   Cornea arcus Trace tear film debris   Anterior Chamber deep and clear deep and clear   Iris Round and dilated, No NVI Round and dilated, No NVI   Lens 2+ Nuclear sclerosis with brunescence, 2+ Cortical cataract 2+ Nuclear sclerosis with brunescence, 2+ Cortical cataract   Anterior Vitreous mild syneresis mild syneresis         Fundus Exam       Right Left   Disc Pink and Sharp, +PPP Pink and Sharp, mild PPP   C/D Ratio 0.2 0.2   Macula Blunted foveal reflex, central edema, scattered MA/DBH greatest centrally -- slightly improved Flat, Blunted foveal reflex, scattered MA   Vessels attenuated, Tortuous attenuated, Tortuous   Periphery Attached, 360 MA / DBH -- improved Attached, rare MA           Refraction     Wearing Rx       Sphere Cylinder Axis Add   Right -1.50 +0.75 114 +2.50   Left -1.50 +0.25 163 +2.50            IMAGING AND PROCEDURES  Imaging and Procedures for 01/24/2023  OCT, Retina - OU - Both Eyes       Right Eye Quality was good. Central Foveal Thickness: 404. Progression has worsened. Findings include no SRF, abnormal foveal contour, intraretinal hyper-reflective material, intraretinal fluid, vitreomacular adhesion (Persistent central IRF/IRHM -- slightly increased).   Left Eye Quality was good. Central Foveal Thickness: 308. Progression has been stable. Findings include normal foveal contour, no IRF, no SRF.   Notes *Images captured and stored on drive  Diagnosis / Impression:  OD: +DME/CME - Persistent  central IRF/IRHM -- slightly increased OS: NFP; no IRF/SRF  Clinical management:  See below  Abbreviations: NFP - Normal foveal profile. CME - cystoid macular edema. PED - pigment epithelial detachment. IRF - intraretinal fluid. SRF - subretinal fluid. EZ - ellipsoid zone. ERM - epiretinal membrane. ORA - outer retinal atrophy. ORT - outer retinal tubulation. SRHM - subretinal hyper-reflective material. IRHM - intraretinal hyper-reflective material      Intravitreal Injection, Pharmacologic Agent - OD - Right Eye       Time Out 01/24/2023. 1:59 PM. Confirmed correct patient, procedure, site, and patient consented.   Anesthesia Topical anesthesia was used. Anesthetic medications included Lidocaine 2%, Proparacaine 0.5%.   Procedure Preparation included 5% betadine to ocular surface, eyelid speculum. A supplied (32g) needle was used.   Injection: 1.25 mg Bevacizumab 1.25mg /0.57ml   Route: Intravitreal, Site: Right Eye   NDC: [id number], Lot: [id number], Expiration date: 02/06/2023   Post-op Post injection exam found visual acuity of at least counting fingers. The patient tolerated the procedure well. There were no complications. The patient received written and verbal post procedure care education. Post injection medications were not given.            ASSESSMENT/PLAN:    ICD-10-CM   1. Moderate nonproliferative diabetic retinopathy of both eyes with macular edema associated with type 2 diabetes mellitus (HCC)  E11.3313 OCT, Retina - OU - Both Eyes    Intravitreal Injection, Pharmacologic Agent - OD - Right Eye    Bevacizumab (AVASTIN) SOLN 1.25 mg    2. Long term (current) use of oral hypoglycemic drugs  Z79.84     3. Essential hypertension  I10     4. Hypertensive retinopathy of both eyes  H35.033     5. Combined forms of age-related cataract of both eyes  H25.813      1,2. Moderate Non-proliferative diabetic retinopathy, both eyes (OD>OS)  - A1c 5.9 on 12/31/22, 6.7 on  09/27/22 - exam shows scattered MA OU (OD > OS) - s/p IVA OD #1 (04.08.24), #2 (05.06.24), #3 (06.03.24), #4 (07.01.24) - FA (04.08.24) shows OD: scattered leaking MA greatest centrally; no NV; Large area of vascular non-perfusion temporal periphery with +perivascular leakage; OS: Scattered MA with late leakage, no NV - OCT OD: +DME/CME - Persistent central IRF/IRHM -- slightly increased; OS: NFP; no IRF/SRF - **discussed decreased efficacy / resistance to Avastin and potential benefit of switching medication**   - will get [employer] verification for next visit - recommend IVA OD #5 today, 07.29.24 for DME - pt wishes to proceed - RBA of procedure discussed, questions answered - IVA informed consent obtained and signed, 04.08.24 (OD) - see procedure note - f/u in 4 wks -- DFE/OCT, possible injection  3,4. Hypertensive retinopathy OU - discussed importance of tight BP control - possible RVO/CME component to macular edema OD - monitor  5. Mixed Cataract OU - The symptoms of cataract, surgical options, and treatments and risks were discussed with patient. - discussed diagnosis and progression - monitor  Ophthalmic Meds Ordered this visit:  Meds ordered this encounter  Medications   Bevacizumab (AVASTIN) SOLN 1.25 mg     Return in about 4 weeks (around 02/21/2023) for f/u NPDR OU, DFE, OCT, Possible, IVA, OD vs IVE OD .  There are no Patient Instructions on file for this visit.  Explained the diagnoses, plan, and follow up with the patient and they expressed understanding.  Patient expressed understanding of the importance of proper follow up care.   This document serves as a record of services personally performed by Karie Chimera, MD,  PhD. It was created on their behalf by De Blanch, an ophthalmic technician. The creation of this record is the provider's dictation and/or activities during the visit.    Electronically signed by: De Blanch, OA, 01/24/23  5:11  PM  This document serves as a record of services personally performed by Karie Chimera, MD, PhD. It was created on their behalf by Gerilyn Nestle, COT an ophthalmic technician. The creation of this record is the provider's dictation and/or activities during the visit.    Electronically signed by:  Charlette Caffey, COT  01/24/23 5:11 PM  Karie Chimera, M.D., Ph.D. Diseases & Surgery of the Retina and Vitreous [facility]  I have reviewed the above documentation for accuracy and completeness, and I agree with the above. Karie Chimera, M.D., Ph.D. 01/24/23 5:13 PM   Abbreviations: M myopia (nearsighted); A astigmatism; H hyperopia (farsighted); P presbyopia; Mrx spectacle prescription;  CTL contact lenses; OD right eye; OS left eye; OU both eyes  XT exotropia; ET esotropia; PEK punctate epithelial keratitis; PEE punctate epithelial erosions; DES dry eye syndrome; MGD meibomian gland dysfunction; ATs artificial tears; PFAT's preservative free artificial tears; NSC nuclear sclerotic cataract; PSC posterior subcapsular cataract; ERM epi-retinal membrane; PVD posterior vitreous detachment; RD retinal detachment; DM diabetes mellitus; DR diabetic retinopathy; NPDR non-proliferative diabetic retinopathy; PDR proliferative diabetic retinopathy; CSME clinically significant macular edema; DME diabetic macular edema; dbh dot blot hemorrhages; CWS cotton wool spot; POAG primary open angle glaucoma; C/D cup-to-disc ratio; HVF humphrey visual field; GVF goldmann visual field; OCT optical coherence tomography; IOP intraocular pressure; BRVO Branch retinal vein occlusion; CRVO central retinal vein occlusion; CRAO central retinal artery occlusion; BRAO branch retinal artery occlusion; RT retinal tear; SB scleral buckle; PPV pars plana vitrectomy; VH Vitreous hemorrhage; PRP panretinal laser photocoagulation; IVK intravitreal kenalog; VMT vitreomacular traction; MH Macular hole;  NVD  neovascularization of the disc; NVE neovascularization elsewhere; AREDS age related eye disease study; ARMD age related macular degeneration; POAG primary open angle glaucoma; EBMD epithelial/anterior basement membrane dystrophy; ACIOL anterior chamber intraocular lens; IOL intraocular lens; PCIOL posterior chamber intraocular lens; Phaco/IOL phacoemulsification with intraocular lens placement; PRK photorefractive keratectomy; LASIK laser assisted in situ keratomileusis; HTN hypertension; DM diabetes mellitus; COPD chronic obstructive pulmonary disease

## 2023-01-24 ENCOUNTER — Ambulatory Visit (INDEPENDENT_AMBULATORY_CARE_PROVIDER_SITE_OTHER): Payer: Medicare HMO | Admitting: Ophthalmology

## 2023-01-24 ENCOUNTER — Encounter (INDEPENDENT_AMBULATORY_CARE_PROVIDER_SITE_OTHER): Payer: Self-pay | Admitting: Ophthalmology

## 2023-01-24 DIAGNOSIS — I10 Essential (primary) hypertension: Secondary | ICD-10-CM

## 2023-01-24 DIAGNOSIS — Z7984 Long term (current) use of oral hypoglycemic drugs: Secondary | ICD-10-CM

## 2023-01-24 DIAGNOSIS — H25813 Combined forms of age-related cataract, bilateral: Secondary | ICD-10-CM | POA: Diagnosis not present

## 2023-01-24 DIAGNOSIS — E113313 Type 2 diabetes mellitus with moderate nonproliferative diabetic retinopathy with macular edema, bilateral: Secondary | ICD-10-CM | POA: Diagnosis not present

## 2023-01-24 DIAGNOSIS — H35033 Hypertensive retinopathy, bilateral: Secondary | ICD-10-CM

## 2023-01-24 MED ORDER — BEVACIZUMAB CHEMO INJECTION 1.25MG/0.05ML SYRINGE FOR KALEIDOSCOPE
1.2500 mg | INTRAVITREAL | Status: AC | PRN
Start: 2023-01-24 — End: 2023-01-24
  Administered 2023-01-24: 1.25 mg via INTRAVITREAL

## 2023-01-25 ENCOUNTER — Telehealth: Payer: Self-pay | Admitting: Nurse Practitioner

## 2023-01-25 NOTE — Telephone Encounter (Signed)
Returned patient's call and reviewed lab results.  

## 2023-01-26 ENCOUNTER — Ambulatory Visit (INDEPENDENT_AMBULATORY_CARE_PROVIDER_SITE_OTHER): Payer: Medicare HMO

## 2023-01-26 VITALS — Ht 70.0 in | Wt 179.0 lb

## 2023-01-26 DIAGNOSIS — Z0000 Encounter for general adult medical examination without abnormal findings: Secondary | ICD-10-CM

## 2023-01-26 NOTE — Patient Instructions (Signed)
Arthur Torres , Thank you for taking time to come for your Medicare Wellness Visit. I appreciate your ongoing commitment to your health goals. Please review the following plan we discussed and let me know if I can assist you in the future.   Referrals/Orders/Follow-Ups/Clinician Recommendations: Aim for 30 minutes of exercise or brisk walking, 6-8 glasses of water, and 5 servings of fruits and vegetables each day.   This is a list of the screening recommended for you and due dates:  Health Maintenance  Topic Date Due   COVID-19 Vaccine (1 - 2023-24 season) Never done   Flu Shot  01/27/2023   Yearly kidney health urinalysis for diabetes  05/28/2023   Hemoglobin A1C  07/03/2023   Eye exam for diabetics  09/20/2023   Yearly kidney function blood test for diabetes  12/31/2023   Complete foot exam   12/31/2023   Medicare Annual Wellness Visit  01/26/2024   Cologuard (Stool DNA test)  01/10/2026   DTaP/Tdap/Td vaccine (2 - Td or Tdap) 01/20/2031   Colon Cancer Screening  01/11/2033   Pneumonia Vaccine  Completed   Hepatitis C Screening  Completed   Zoster (Shingles) Vaccine  Completed   HPV Vaccine  Aged Out    Advanced directives: (Copy Requested) Please bring a copy of your health care power of attorney and living will to the office to be added to your chart at your convenience.  Next Medicare Annual Wellness Visit scheduled for next year: Yes  Preventive Care 39 Years and Older, Male  Preventive care refers to lifestyle choices and visits with your health care provider that can promote health and wellness. What does preventive care include? A yearly physical exam. This is also called an annual well check. Dental exams once or twice a year. Routine eye exams. Ask your health care provider how often you should have your eyes checked. Personal lifestyle choices, including: Daily care of your teeth and gums. Regular physical activity. Eating a healthy diet. Avoiding tobacco and drug  use. Limiting alcohol use. Practicing safe sex. Taking low doses of aspirin every day. Taking vitamin and mineral supplements as recommended by your health care provider. What happens during an annual well check? The services and screenings done by your health care provider during your annual well check will depend on your age, overall health, lifestyle risk factors, and family history of disease. Counseling  Your health care provider may ask you questions about your: Alcohol use. Tobacco use. Drug use. Emotional well-being. Home and relationship well-being. Sexual activity. Eating habits. History of falls. Memory and ability to understand (cognition). Work and work Astronomer. Screening  You may have the following tests or measurements: Height, weight, and BMI. Blood pressure. Lipid and cholesterol levels. These may be checked every 5 years, or more frequently if you are over 20 years old. Skin check. Lung cancer screening. You may have this screening every year starting at age 65 if you have a 30-pack-year history of smoking and currently smoke or have quit within the past 15 years. Fecal occult blood test (FOBT) of the stool. You may have this test every year starting at age 61. Flexible sigmoidoscopy or colonoscopy. You may have a sigmoidoscopy every 5 years or a colonoscopy every 10 years starting at age 73. Prostate cancer screening. Recommendations will vary depending on your family history and other risks. Hepatitis C blood test. Hepatitis B blood test. Sexually transmitted disease (STD) testing. Diabetes screening. This is done by checking your blood sugar (glucose)  after you have not eaten for a while (fasting). You may have this done every 1-3 years. Abdominal aortic aneurysm (AAA) screening. You may need this if you are a current or former smoker. Osteoporosis. You may be screened starting at age 42 if you are at high risk. Talk with your health care provider about  your test results, treatment options, and if necessary, the need for more tests. Vaccines  Your health care provider may recommend certain vaccines, such as: Influenza vaccine. This is recommended every year. Tetanus, diphtheria, and acellular pertussis (Tdap, Td) vaccine. You may need a Td booster every 10 years. Zoster vaccine. You may need this after age 60. Pneumococcal 13-valent conjugate (PCV13) vaccine. One dose is recommended after age 9. Pneumococcal polysaccharide (PPSV23) vaccine. One dose is recommended after age 27. Talk to your health care provider about which screenings and vaccines you need and how often you need them. This information is not intended to replace advice given to you by your health care provider. Make sure you discuss any questions you have with your health care provider. Document Released: 07/11/2015 Document Revised: 03/03/2016 Document Reviewed: 04/15/2015 Elsevier Interactive Patient Education  2017 [employer].  Fall Prevention in the Home Falls can cause injuries. They can happen to people of all ages. There are many things you can do to make your home safe and to help prevent falls. What can I do on the outside of my home? Regularly fix the edges of walkways and driveways and fix any cracks. Remove anything that might make you trip as you walk through a door, such as a raised step or threshold. Trim any bushes or trees on the path to your home. Use bright outdoor lighting. Clear any walking paths of anything that might make someone trip, such as rocks or tools. Regularly check to see if handrails are loose or broken. Make sure that both sides of any steps have handrails. Any raised decks and porches should have guardrails on the edges. Have any leaves, snow, or ice cleared regularly. Use sand or salt on walking paths during winter. Clean up any spills in your garage right away. This includes oil or grease spills. What can I do in the bathroom? Use  night lights. Install grab bars by the toilet and in the tub and shower. Do not use towel bars as grab bars. Use non-skid mats or decals in the tub or shower. If you need to sit down in the shower, use a plastic, non-slip stool. Keep the floor dry. Clean up any water that spills on the floor as soon as it happens. Remove soap buildup in the tub or shower regularly. Attach bath mats securely with double-sided non-slip rug tape. Do not have throw rugs and other things on the floor that can make you trip. What can I do in the bedroom? Use night lights. Make sure that you have a light by your bed that is easy to reach. Do not use any sheets or blankets that are too big for your bed. They should not hang down onto the floor. Have a firm chair that has side arms. You can use this for support while you get dressed. Do not have throw rugs and other things on the floor that can make you trip. What can I do in the kitchen? Clean up any spills right away. Avoid walking on wet floors. Keep items that you use a lot in easy-to-reach places. If you need to reach something above you, use a  strong step stool that has a grab bar. Keep electrical cords out of the way. Do not use floor polish or wax that makes floors slippery. If you must use wax, use non-skid floor wax. Do not have throw rugs and other things on the floor that can make you trip. What can I do with my stairs? Do not leave any items on the stairs. Make sure that there are handrails on both sides of the stairs and use them. Fix handrails that are broken or loose. Make sure that handrails are as long as the stairways. Check any carpeting to make sure that it is firmly attached to the stairs. Fix any carpet that is loose or worn. Avoid having throw rugs at the top or bottom of the stairs. If you do have throw rugs, attach them to the floor with carpet tape. Make sure that you have a light switch at the top of the stairs and the bottom of the  stairs. If you do not have them, ask someone to add them for you. What else can I do to help prevent falls? Wear shoes that: Do not have high heels. Have rubber bottoms. Are comfortable and fit you well. Are closed at the toe. Do not wear sandals. If you use a stepladder: Make sure that it is fully opened. Do not climb a closed stepladder. Make sure that both sides of the stepladder are locked into place. Ask someone to hold it for you, if possible. Clearly mark and make sure that you can see: Any grab bars or handrails. First and last steps. Where the edge of each step is. Use tools that help you move around (mobility aids) if they are needed. These include: Canes. Walkers. Scooters. Crutches. Turn on the lights when you go into a dark area. Replace any light bulbs as soon as they burn out. Set up your furniture so you have a clear path. Avoid moving your furniture around. If any of your floors are uneven, fix them. If there are any pets around you, be aware of where they are. Review your medicines with your doctor. Some medicines can make you feel dizzy. This can increase your chance of falling. Ask your doctor what other things that you can do to help prevent falls. This information is not intended to replace advice given to you by your health care provider. Make sure you discuss any questions you have with your health care provider. Document Released: 04/10/2009 Document Revised: 11/20/2015 Document Reviewed: 07/19/2014 Elsevier Interactive Patient Education  2017 [employer].

## 2023-01-26 NOTE — Progress Notes (Signed)
Subjective:   Arthur Torres is a 72 y.o. male who presents for Medicare Annual/Subsequent preventive examination.  Visit Complete: Virtual  I connected with  [name] on 01/26/23 by a audio enabled telemedicine application and verified that I am speaking with the correct person using two identifiers.  Patient Location: Home  Provider Location: Home Office  I discussed the limitations of evaluation and management by telemedicine. The patient expressed understanding and agreed to proceed.  Patient Medicare AWV questionnaire was completed by the patient on 01/26/2023; I have confirmed that all information answered by patient is correct and no changes since this date.  Review of Systems    Vital Signs: Unable to obtain new vitals due to this being a telehealth visit.  Cardiac Risk Factors include: advanced age (>3men, >59 women);diabetes mellitus;dyslipidemia;hypertension;male gender Nutrition Risk Assessment:  Has the patient had any N/V/D within the last 2 months?  No  Does the patient have any non-healing wounds?  No  Has the patient had any unintentional weight loss or weight gain?  No   Diabetes:  Is the patient diabetic?  Yes  If diabetic, was a CBG obtained today?  No  Did the patient bring in their glucometer from home?  No  How often do you monitor your CBG's? Never .   Financial Strains and Diabetes Management:  Are you having any financial strains with the device, your supplies or your medication? No .  Does the patient want to be seen by Chronic Care Management for management of their diabetes?  No  Would the patient like to be referred to a Nutritionist or for Diabetic Management?  No   Diabetic Exams:  Diabetic Eye Exam: Completed 12/2022 Diabetic Foot Exam: Overdue, Pt has been advised about the importance in completing this exam. Pt is scheduled for diabetic foot exam on next office viait .     Objective:    Today's Vitals   01/26/23 1341  Weight:  179 lb (81.2 kg)  Height: 5\' 10"  (1.778 m)   Body mass index is 25.68 kg/m.     01/26/2023    1:45 PM 01/22/2022    4:00 PM 01/21/2021   11:37 AM 09/12/2019    8:39 AM  Advanced Directives  Does Patient Have a Medical Advance Directive? Yes Yes Yes No  Type of Estate agent of [place];Living will Healthcare Power of [place];Living will Living will   Copy of Healthcare Power of Attorney in Chart? No - copy requested No - copy requested    Would patient like information on creating a medical advance directive?    No - Patient declined    Current Medications (verified) Outpatient Encounter Medications as of 01/26/2023  Medication Sig   aspirin 325 MG EC tablet Take 325 mg by mouth daily.   glipiZIDE (GLUCOTROL) 5 MG tablet TAKE 1 TABLET (5 MG TOTAL) BY MOUTH TWICE A DAY BEFORE MEALS   metoprolol succinate (TOPROL-XL) 50 MG 24 hr tablet Take 1 tablet (50 mg total) by mouth daily. Take with or immediately following a meal.   pantoprazole (PROTONIX) 40 MG tablet Take 1 tablet (40 mg total) by mouth daily.   rosuvastatin (CRESTOR) 10 MG tablet TAKE 1 TABLET BY MOUTH EVERY DAY   sitaGLIPtin-metformin (JANUMET) 50-500 MG tablet Take 1 tablet by mouth 2 (two) times daily with a meal.   No facility-administered encounter medications on file as of 01/26/2023.    Allergies (verified) Penicillins and Zocor [simvastatin]   History:  Past Medical History:  Diagnosis Date   CAD (coronary artery disease)    1999 occluded LAD treated with PCI and stenting, 80% stenosis of the first diagonal feeling angioplasty, 60-70% second stenosis, 50% ostial left main stenosis, 25% right coronary artery stenosis, 50% ostial PDA stenosis.   Cancer (HCC)    prostate (treated with radiation)   COVID-19    Diabetes mellitus without complication (HCC)    Hyperlipidemia    Hypertension    Joint pain    Past Surgical History:  Procedure Laterality Date   CARDIAC CATHETERIZATION     COLON  SURGERY     hemrrhoids   CORONARY ANGIOPLASTY WITH STENT PLACEMENT     Family History  Problem Relation Age of Onset   Diabetes Mother    Stroke Mother    Cancer Father        skin, prostate, stomach   Healthy Daughter    Healthy Son    Asthma Son    Social History   Socioeconomic History   Marital status: Married    Spouse name: Darl Pikes   Number of children: 2   Years of education: Not on file   Highest education level: High school graduate  Occupational History   Occupation: Autobody work    Associate Professor: [employer]  Tobacco Use   Smoking status: Former    Current packs/day: 0.00    Types: Cigarettes    Quit date: 03/28/1998    Years since quitting: 24.8   Smokeless tobacco: Never  Vaping Use   Vaping status: Never Used  Substance and Sexual Activity   Alcohol use: No   Drug use: No   Sexual activity: Yes  Other Topics Concern   Not on file  Social History Narrative   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 [place]   Social Determinants of Health   Financial Resource Strain: Low Risk  (01/26/2023)   Overall Financial Resource Strain (CARDIA)    Difficulty of Paying Living Expenses: Not hard at all  Food Insecurity: No Food Insecurity (01/26/2023)   Hunger Vital Sign    Worried About Running Out of Food in the Last Year: Never true    Ran Out of Food in the Last Year: Never true  Transportation Needs: No Transportation Needs (01/26/2023)   PRAPARE - Administrator, Civil Service (Medical): No    Lack of Transportation (Non-Medical): No  Physical Activity: Insufficiently Active (01/26/2023)   Exercise Vital Sign    Days of Exercise per Week: 3 days    Minutes of Exercise per Session: 30 min  Stress: No Stress Concern Present (01/26/2023)   [employer] of Occupational Health - Occupational Stress Questionnaire    Feeling of Stress : Not at all  Social Connections: Moderately Isolated (01/26/2023)   Social Connection and Isolation Panel [NHANES]     Frequency of Communication with Friends and Family: More than three times a week    Frequency of Social Gatherings with Friends and Family: More than three times a week    Attends Religious Services: Never    Database administrator or Organizations: No    Attends Engineer, structural: Never    Marital Status: Married    Tobacco Counseling Counseling given: Not Answered   Clinical Intake:  Pre-visit preparation completed: Yes  Pain : No/denies pain     Nutritional Risks: None Diabetes: Yes CBG done?: No Did pt. bring in CBG monitor from home?: No  How often do you need  to have someone help you when you read instructions, pamphlets, or other written materials from your doctor or pharmacy?: 1 - Never  Interpreter Needed?: No  Information entered by :: Renie Ora, LPN   Activities of Daily Living    01/26/2023    1:45 PM  In your present state of health, do you have any difficulty performing the following activities:  Hearing? 0  Vision? 0  Difficulty concentrating or making decisions? 0  Walking or climbing stairs? 0  Dressing or bathing? 0  Doing errands, shopping? 0  Preparing Food and eating ? N  Using the Toilet? N  In the past six months, have you accidently leaked urine? N  Do you have problems with loss of bowel control? N  Managing your Medications? N  Managing your Finances? N  Housekeeping or managing your Housekeeping? N    Patient Care Team: Bennie Pierini, FNP as PCP - General (Nurse Practitioner)  Indicate any recent Medical Services you may have received from other than [facility] providers in the past year (date may be approximate).     Assessment:   This is a routine wellness examination for Ashaz.  Hearing/Vision screen Vision Screening - Comments:: Wears rx glasses - up to date with routine eye exams with  Dr.Zamora  Dietary issues and exercise activities discussed:     Goals Addressed             This Visit's  Progress    Exercise 150 min/wk Moderate Activity   On track      Depression Screen    01/26/2023    1:44 PM 12/31/2022    7:57 AM 12/13/2022    8:55 AM 09/27/2022    9:23 AM 08/27/2022   10:07 AM 05/27/2022   12:10 PM 01/26/2022   12:06 PM  PHQ 2/9 Scores  PHQ - 2 Score 0 0 0 0 0 0 0  PHQ- 9 Score 0 0 0 0 0 0 0    Fall Risk    01/26/2023    1:42 PM 12/31/2022    7:57 AM 12/13/2022    8:55 AM 09/27/2022    9:23 AM 08/27/2022   10:07 AM  Fall Risk   Falls in the past year? 0 0 0 0 0  Number falls in past yr: 0      Injury with Fall? 0      Risk for fall due to : No Fall Risks      Follow up Falls prevention discussed        MEDICARE RISK AT HOME:  Medicare Risk at Home - 01/26/23 1343     Any stairs in or around the home? Yes    If so, are there any without handrails? No    Home free of loose throw rugs in walkways, pet beds, electrical cords, etc? Yes    Adequate lighting in your home to reduce risk of falls? Yes    Life alert? No    Use of a cane, walker or w/c? No    Grab bars in the bathroom? No    Shower chair or bench in shower? No    Elevated toilet seat or a handicapped toilet? No             TIMED UP AND GO:  Was the test performed?  No    Cognitive Function:    09/11/2018    4:14 PM  MMSE - Mini Mental State Exam  Orientation to time 5  Orientation  to Place 5  Registration 3  Attention/ Calculation 5  Recall 3  Language- name 2 objects 2  Language- repeat 1  Language- follow 3 step command 3  Language- read & follow direction 1  Write a sentence 1  Copy design 1  Total score 30        01/26/2023    1:45 PM 01/22/2022    4:03 PM 01/21/2021   11:39 AM 09/12/2019    8:40 AM  6CIT Screen  What Year? 0 points 0 points 0 points 0 points  What month? 0 points 0 points 0 points 0 points  What time? 0 points 0 points 0 points 0 points  Count back from 20 0 points 0 points 0 points 0 points  Months in reverse 0 points 0 points 0 points 0 points  Repeat  phrase 0 points 0 points 0 points 0 points  Total Score 0 points 0 points 0 points 0 points    Immunizations Immunization History  Administered Date(s) Administered   Fluad Quad(high Dose 65+) 05/12/2021, 05/27/2022   Influenza,inj,Quad PF,6+ Mos 04/13/2013, 04/17/2015, 03/27/2018   Pneumococcal Conjugate-13 08/21/2019   Pneumococcal Polysaccharide-23 12/11/2013, 10/23/2020   Tdap 01/19/2021   Zoster Recombinant(Shingrix) 05/03/2017, 08/08/2017    TDAP status: Up to date  Flu Vaccine status: Up to date  Pneumococcal vaccine status: Up to date  Covid-19 vaccine status: Completed vaccines  Qualifies for Shingles Vaccine? Yes   Zostavax completed Yes   Shingrix Completed?: No.    Education has been provided regarding the importance of this vaccine. Patient has been advised to call insurance company to determine out of pocket expense if they have not yet received this vaccine. Advised may also receive vaccine at local pharmacy or Health Dept. Verbalized acceptance and understanding.  Screening Tests Health Maintenance  Topic Date Due   COVID-19 Vaccine (1 - 2023-24 season) Never done   INFLUENZA VACCINE  01/27/2023   Diabetic kidney evaluation - Urine ACR  05/28/2023   HEMOGLOBIN A1C  07/03/2023   OPHTHALMOLOGY EXAM  09/20/2023   Diabetic kidney evaluation - eGFR measurement  12/31/2023   FOOT EXAM  12/31/2023   Medicare Annual Wellness (AWV)  01/26/2024   Fecal DNA (Cologuard)  01/10/2026   DTaP/Tdap/Td (2 - Td or Tdap) 01/20/2031   Colonoscopy  01/11/2033   Pneumonia Vaccine 73+ Years old  Completed   Hepatitis C Screening  Completed   Zoster Vaccines- Shingrix  Completed   HPV VACCINES  Aged Out    Health Maintenance  Health Maintenance Due  Topic Date Due   COVID-19 Vaccine (1 - 2023-24 season) Never done    Colorectal cancer screening: Type of screening: Cologuard. Completed 01/11/2023. Repeat every 3 years  Lung Cancer Screening: (Low Dose CT Chest  recommended if Age 71-80 years, 20 pack-year currently smoking OR have quit w/in 15years.) does not qualify.   Lung Cancer Screening Referral: n/a  Additional Screening:  Hepatitis C Screening: does not qualify; Completed 07/24/2015  Vision Screening: Recommended annual ophthalmology exams for early detection of glaucoma and other disorders of the eye. Is the patient up to date with their annual eye exam?  Yes  Who is the provider or what is the name of the office in which the patient attends annual eye exams? Dr.Zamora  If pt is not established with a provider, would they like to be referred to a provider to establish care? No .   Dental Screening: Recommended annual dental exams for proper oral hygiene  Community Resource Referral / Chronic Care Management: CRR required this visit?  No   CCM required this visit?  No     Plan:     I have personally reviewed and noted the following in the patient's chart:   Medical and social history Use of alcohol, tobacco or illicit drugs  Current medications and supplements including opioid prescriptions. Patient is not currently taking opioid prescriptions. Functional ability and status Nutritional status Physical activity Advanced directives List of other physicians Hospitalizations, surgeries, and ER visits in previous 12 months Vitals Screenings to include cognitive, depression, and falls Referrals and appointments  In addition, I have reviewed and discussed with patient certain preventive protocols, quality metrics, and best practice recommendations. A written personalized care plan for preventive services as well as general preventive health recommendations were provided to patient.     Lorrene Reid, LPN   1/61/0960   After Visit Summary: (MyChart) Due to this being a telephonic visit, the after visit summary with patients personalized plan was offered to patient via MyChart   Nurse Notes: none

## 2023-02-01 DIAGNOSIS — B351 Tinea unguium: Secondary | ICD-10-CM | POA: Diagnosis not present

## 2023-02-01 DIAGNOSIS — L84 Corns and callosities: Secondary | ICD-10-CM | POA: Diagnosis not present

## 2023-02-01 DIAGNOSIS — M79676 Pain in unspecified toe(s): Secondary | ICD-10-CM | POA: Diagnosis not present

## 2023-02-01 DIAGNOSIS — E1142 Type 2 diabetes mellitus with diabetic polyneuropathy: Secondary | ICD-10-CM | POA: Diagnosis not present

## 2023-02-17 NOTE — Progress Notes (Signed)
[facility] - Clinic Note  02/21/2023     CHIEF COMPLAINT Patient presents for Retina Follow Up   HISTORY OF PRESENT ILLNESS: Arthur Torres is a 72 y.o. male who presents to the clinic today for:   HPI     Retina Follow Up   Patient presents with  Diabetic Retinopathy.  In both eyes.  Severity is moderate.  Duration of 4 weeks.  Since onset it is stable.  I, the attending physician,  performed the HPI with the patient and updated documentation appropriately.        Comments   Pt here for 4 wk ret f/u NPDR OU. Pt states VA the same, is seeing a floater in OD since previous injection. Showed up day of injection but hasn't gone away.       Last edited by Rennis Chris, MD on 02/21/2023  5:18 PM.    Patient feels the vision is the same. His A1C has lowered.  Referring physician: Bennie Pierini, FNP [address] [place],  [place] [address]  HISTORICAL INFORMATION:   Selected notes from the MEDICAL RECORD NUMBER Referred by Dr. Despina Arias for DM exam LEE:  Ocular Hx- PMH-    CURRENT MEDICATIONS: No current outpatient medications on file. (Ophthalmic Drugs)   No current facility-administered medications for this visit. (Ophthalmic Drugs)   Current Outpatient Medications (Other)  Medication Sig   aspirin 325 MG EC tablet Take 325 mg by mouth daily.   glipiZIDE (GLUCOTROL) 5 MG tablet TAKE 1 TABLET (5 MG TOTAL) BY MOUTH TWICE A DAY BEFORE MEALS   metoprolol succinate (TOPROL-XL) 50 MG 24 hr tablet Take 1 tablet (50 mg total) by mouth daily. Take with or immediately following a meal.   pantoprazole (PROTONIX) 40 MG tablet Take 1 tablet (40 mg total) by mouth daily.   rosuvastatin (CRESTOR) 10 MG tablet TAKE 1 TABLET BY MOUTH EVERY DAY   sitaGLIPtin-metformin (JANUMET) 50-500 MG tablet Take 1 tablet by mouth 2 (two) times daily with a meal.   No current facility-administered medications for this visit. (Other)   REVIEW OF SYSTEMS: ROS    Positive for: Endocrine, Eyes Negative for: Constitutional, Gastrointestinal, Neurological, Skin, Genitourinary, Musculoskeletal, HENT, Cardiovascular, Respiratory, Psychiatric, Allergic/Imm, Heme/Lymph Last edited by Thompson Grayer, COT on 02/21/2023  1:28 PM.      ALLERGIES Allergies  Allergen Reactions   Penicillins    Zocor [Simvastatin]    PAST MEDICAL HISTORY Past Medical History:  Diagnosis Date   CAD (coronary artery disease)    1999 occluded LAD treated with PCI and stenting, 80% stenosis of the first diagonal feeling angioplasty, 60-70% second stenosis, 50% ostial left main stenosis, 25% right coronary artery stenosis, 50% ostial PDA stenosis.   Cancer [facility])    prostate (treated with radiation)   COVID-19    Diabetes mellitus without complication (HCC)    Hyperlipidemia    Hypertension    Joint pain    Past Surgical History:  Procedure Laterality Date   CARDIAC CATHETERIZATION     COLON SURGERY     hemrrhoids   CORONARY ANGIOPLASTY WITH STENT PLACEMENT     FAMILY HISTORY Family History  Problem Relation Age of Onset   Diabetes Mother    Stroke Mother    Cancer Father        skin, prostate, stomach   Healthy Daughter    Healthy Son    Asthma Son    SOCIAL HISTORY Social History   Tobacco  Use   Smoking status: Former    Current packs/day: 0.00    Types: Cigarettes    Quit date: 03/28/1998    Years since quitting: 24.9   Smokeless tobacco: Never  Vaping Use   Vaping status: Never Used  Substance Use Topics   Alcohol use: No   Drug use: No       OPHTHALMIC EXAM:  Base Eye Exam     Visual Acuity (Snellen - Linear)       Right Left   Dist cc 20/50 -2 20/20   Dist ph cc 20/50 +2     Correction: Glasses         Tonometry (Tonopen, 1:37 PM)       Right Left   Pressure 22 18         Pupils       Pupils Dark Light Shape React APD   Right PERRL 3 2 Round Brisk None   Left PERRL 3 2 Round Brisk None         Visual Fields  (Counting fingers)       Left Right    Full Full         Extraocular Movement       Right Left    Full, Ortho Full, Ortho         Neuro/Psych     Oriented x3: Yes   Mood/Affect: Normal         Dilation     Both eyes: 1.0% Mydriacyl, 2.5% Phenylephrine @ 1:38 PM           Slit Lamp and Fundus Exam     Slit Lamp Exam       Right Left   Lids/Lashes Dermatochalasis - upper lid, Meibomian gland dysfunction Dermatochalasis - upper lid, Meibomian gland dysfunction   Conjunctiva/Sclera White and quiet nasal and temporal pinguecula   Cornea arcus Trace tear film debris   Anterior Chamber deep and clear deep and clear   Iris Round and dilated, No NVI Round and dilated, No NVI   Lens 2+ Nuclear sclerosis with brunescence, 2+ Cortical cataract 2+ Nuclear sclerosis with brunescence, 2+ Cortical cataract   Anterior Vitreous mild syneresis mild syneresis         Fundus Exam       Right Left   Disc Pink and Sharp, +PPP Pink and Sharp, mild PPP   C/D Ratio 0.2 0.2   Macula Blunted foveal reflex, central edema, scattered MA/DBH greatest centrally -- slightly improved Flat, Blunted foveal reflex, scattered MA   Vessels attenuated, Tortuous attenuated, Tortuous   Periphery Attached, 360 MA / DBH -- improved Attached, rare MA           Refraction     Wearing Rx       Sphere Cylinder Axis Add   Right -1.50 +0.75 114 +2.50   Left -1.50 +0.25 163 +2.50            IMAGING AND PROCEDURES  Imaging and Procedures for 02/21/2023  OCT, Retina - OU - Both Eyes       Right Eye Quality was good. Central Foveal Thickness: 385. Progression has improved. Findings include no SRF, abnormal foveal contour, intraretinal hyper-reflective material, intraretinal fluid, vitreomacular adhesion (Mild interval improvement in IRF/IRHM ).   Left Eye Quality was good. Central Foveal Thickness: 309. Progression has been stable. Findings include normal foveal contour, no SRF,  intraretinal fluid (Focal cystic changes ST mac).   Notes *Images captured and stored  on drive  Diagnosis / Impression:  OD: +DME/CME - Mild interval improvement in IRF/IRHM  OS: Focal cystic changes ST mac  Clinical management:  See below  Abbreviations: NFP - Normal foveal profile. CME - cystoid macular edema. PED - pigment epithelial detachment. IRF - intraretinal fluid. SRF - subretinal fluid. EZ - ellipsoid zone. ERM - epiretinal membrane. ORA - outer retinal atrophy. ORT - outer retinal tubulation. SRHM - subretinal hyper-reflective material. IRHM - intraretinal hyper-reflective material      Intravitreal Injection, Pharmacologic Agent - OD - Right Eye       Time Out 02/21/2023. 2:54 PM. Confirmed correct patient, procedure, site, and patient consented.   Anesthesia Topical anesthesia was used. Anesthetic medications included Lidocaine 2%, Proparacaine 0.5%.   Procedure Preparation included 5% betadine to ocular surface, eyelid speculum. A supplied (32g) needle was used.   Injection: 1.25 mg Bevacizumab 1.25mg /0.26ml   Route: Intravitreal, Site: Right Eye   NDC: 16109-604-54, Lot: [id number] , Expiration date: 03/05/2023   Post-op Post injection exam found visual acuity of at least counting fingers. The patient tolerated the procedure well. There were no complications. The patient received written and verbal post procedure care education. Post injection medications were not given.             ASSESSMENT/PLAN:    ICD-10-CM   1. Moderate nonproliferative diabetic retinopathy of both eyes with macular edema associated with type 2 diabetes mellitus (HCC)  E11.3313 OCT, Retina - OU - Both Eyes    Intravitreal Injection, Pharmacologic Agent - OD - Right Eye    Bevacizumab (AVASTIN) SOLN 1.25 mg    2. Long term (current) use of oral hypoglycemic drugs  Z79.84     3. Essential hypertension  I10     4. Hypertensive retinopathy of both eyes  H35.033     5. Combined  forms of age-related cataract of both eyes  H25.813       1,2. Moderate Non-proliferative diabetic retinopathy, both eyes (OD>OS)  - A1c 5.9 on 07.05.24, 6.7 on 04.01.24 - exam shows scattered MA OU (OD > OS) - s/p IVA OD #1 (04.08.24), #2 (05.06.24), #3 (06.03.24), #4 (07.01.24), #5 (07.29.24) - FA (04.08.24) shows OD: scattered leaking MA greatest centrally; no NV; Large area of vascular non-perfusion temporal periphery with +perivascular leakage; OS: Scattered MA with late leakage, no NV - BCVA OD 20/50, OS 20/20 -- stable OU - OCT OD shows Mild interval improvement in IRF/IRHM; OS: Focal cystic changes ST mac - recommend IVA OD #6 today, 08.26.24 for DME - pt wishes to proceed - RBA of procedure discussed, questions answered - IVA informed consent obtained and signed, 04.08.24 (OD) - see procedure note - Eylea approved for 2024 - f/u in 4 wks -- DFE/OCT, possible injection  3,4. Hypertensive retinopathy OU - discussed importance of tight BP control - possible RVO/CME component to macular edema OD - monitor  5. Mixed Cataract OU - The symptoms of cataract, surgical options, and treatments and risks were discussed with patient. - discussed diagnosis and progression - monitor  Ophthalmic Meds Ordered this visit:  Meds ordered this encounter  Medications   Bevacizumab (AVASTIN) SOLN 1.25 mg     Return in about 4 weeks (around 03/21/2023) for f/u NPDR OU, DFE, OCT.  There are no Patient Instructions on file for this visit.  Explained the diagnoses, plan, and follow up with the patient and they expressed understanding.  Patient expressed understanding of the importance of proper follow up care.  This document serves as a record of services personally performed by Karie Chimera, MD, PhD. It was created on their behalf by Annalee Genta, COMT. The creation of this record is the provider's dictation and/or activities during the visit.  Electronically signed by: Annalee Genta,  COMT 02/24/23 1:54 AM  This document serves as a record of services personally performed by Karie Chimera, MD, PhD. It was created on their behalf by Glee Arvin. Manson Passey, OA an ophthalmic technician. The creation of this record is the provider's dictation and/or activities during the visit.    Electronically signed by: Glee Arvin. Manson Passey, OA 02/24/23 1:54 AM  Karie Chimera, M.D., Ph.D. Diseases & Surgery of the Retina and Vitreous [facility]  I have reviewed the above documentation for accuracy and completeness, and I agree with the above. Karie Chimera, M.D., Ph.D. 02/24/23 1:55 AM   Abbreviations: M myopia (nearsighted); A astigmatism; H hyperopia (farsighted); P presbyopia; Mrx spectacle prescription;  CTL contact lenses; OD right eye; OS left eye; OU both eyes  XT exotropia; ET esotropia; PEK punctate epithelial keratitis; PEE punctate epithelial erosions; DES dry eye syndrome; MGD meibomian gland dysfunction; ATs artificial tears; PFAT's preservative free artificial tears; NSC nuclear sclerotic cataract; PSC posterior subcapsular cataract; ERM epi-retinal membrane; PVD posterior vitreous detachment; RD retinal detachment; DM diabetes mellitus; DR diabetic retinopathy; NPDR non-proliferative diabetic retinopathy; PDR proliferative diabetic retinopathy; CSME clinically significant macular edema; DME diabetic macular edema; dbh dot blot hemorrhages; CWS cotton wool spot; POAG primary open angle glaucoma; C/D cup-to-disc ratio; HVF humphrey visual field; GVF goldmann visual field; OCT optical coherence tomography; IOP intraocular pressure; BRVO Branch retinal vein occlusion; CRVO central retinal vein occlusion; CRAO central retinal artery occlusion; BRAO branch retinal artery occlusion; RT retinal tear; SB scleral buckle; PPV pars plana vitrectomy; VH Vitreous hemorrhage; PRP panretinal laser photocoagulation; IVK intravitreal kenalog; VMT vitreomacular traction; MH Macular  hole;  NVD neovascularization of the disc; NVE neovascularization elsewhere; AREDS age related eye disease study; ARMD age related macular degeneration; POAG primary open angle glaucoma; EBMD epithelial/anterior basement membrane dystrophy; ACIOL anterior chamber intraocular lens; IOL intraocular lens; PCIOL posterior chamber intraocular lens; Phaco/IOL phacoemulsification with intraocular lens placement; PRK photorefractive keratectomy; LASIK laser assisted in situ keratomileusis; HTN hypertension; DM diabetes mellitus; COPD chronic obstructive pulmonary disease

## 2023-02-19 DIAGNOSIS — K219 Gastro-esophageal reflux disease without esophagitis: Secondary | ICD-10-CM | POA: Diagnosis not present

## 2023-02-19 DIAGNOSIS — Z7984 Long term (current) use of oral hypoglycemic drugs: Secondary | ICD-10-CM | POA: Diagnosis not present

## 2023-02-19 DIAGNOSIS — Z880 Allergy status to penicillin: Secondary | ICD-10-CM | POA: Diagnosis not present

## 2023-02-19 DIAGNOSIS — E785 Hyperlipidemia, unspecified: Secondary | ICD-10-CM | POA: Diagnosis not present

## 2023-02-19 DIAGNOSIS — Z8249 Family history of ischemic heart disease and other diseases of the circulatory system: Secondary | ICD-10-CM | POA: Diagnosis not present

## 2023-02-19 DIAGNOSIS — Z809 Family history of malignant neoplasm, unspecified: Secondary | ICD-10-CM | POA: Diagnosis not present

## 2023-02-19 DIAGNOSIS — Z823 Family history of stroke: Secondary | ICD-10-CM | POA: Diagnosis not present

## 2023-02-19 DIAGNOSIS — I10 Essential (primary) hypertension: Secondary | ICD-10-CM | POA: Diagnosis not present

## 2023-02-19 DIAGNOSIS — M1990 Unspecified osteoarthritis, unspecified site: Secondary | ICD-10-CM | POA: Diagnosis not present

## 2023-02-19 DIAGNOSIS — E1151 Type 2 diabetes mellitus with diabetic peripheral angiopathy without gangrene: Secondary | ICD-10-CM | POA: Diagnosis not present

## 2023-02-19 DIAGNOSIS — N529 Male erectile dysfunction, unspecified: Secondary | ICD-10-CM | POA: Diagnosis not present

## 2023-02-19 DIAGNOSIS — Z008 Encounter for other general examination: Secondary | ICD-10-CM | POA: Diagnosis not present

## 2023-02-19 DIAGNOSIS — Z87891 Personal history of nicotine dependence: Secondary | ICD-10-CM | POA: Diagnosis not present

## 2023-02-21 ENCOUNTER — Encounter (INDEPENDENT_AMBULATORY_CARE_PROVIDER_SITE_OTHER): Payer: Self-pay | Admitting: Ophthalmology

## 2023-02-21 ENCOUNTER — Ambulatory Visit (INDEPENDENT_AMBULATORY_CARE_PROVIDER_SITE_OTHER): Payer: Medicare HMO | Admitting: Ophthalmology

## 2023-02-21 DIAGNOSIS — H25813 Combined forms of age-related cataract, bilateral: Secondary | ICD-10-CM | POA: Diagnosis not present

## 2023-02-21 DIAGNOSIS — Z7984 Long term (current) use of oral hypoglycemic drugs: Secondary | ICD-10-CM | POA: Diagnosis not present

## 2023-02-21 DIAGNOSIS — H35033 Hypertensive retinopathy, bilateral: Secondary | ICD-10-CM | POA: Diagnosis not present

## 2023-02-21 DIAGNOSIS — I10 Essential (primary) hypertension: Secondary | ICD-10-CM | POA: Diagnosis not present

## 2023-02-21 DIAGNOSIS — E113313 Type 2 diabetes mellitus with moderate nonproliferative diabetic retinopathy with macular edema, bilateral: Secondary | ICD-10-CM | POA: Diagnosis not present

## 2023-02-21 MED ORDER — BEVACIZUMAB CHEMO INJECTION 1.25MG/0.05ML SYRINGE FOR KALEIDOSCOPE
1.2500 mg | INTRAVITREAL | Status: AC | PRN
Start: 2023-02-21 — End: 2023-02-21
  Administered 2023-02-21: 1.25 mg via INTRAVITREAL

## 2023-03-11 ENCOUNTER — Other Ambulatory Visit: Payer: Self-pay | Admitting: Nurse Practitioner

## 2023-03-11 DIAGNOSIS — E785 Hyperlipidemia, unspecified: Secondary | ICD-10-CM

## 2023-03-23 ENCOUNTER — Encounter (INDEPENDENT_AMBULATORY_CARE_PROVIDER_SITE_OTHER): Payer: Medicare HMO | Admitting: Ophthalmology

## 2023-03-25 NOTE — Progress Notes (Signed)
[facility] - Clinic Note  03/28/2023     CHIEF COMPLAINT Patient presents for Retina Follow Up   HISTORY OF PRESENT ILLNESS: Arthur Torres is a 72 y.o. male who presents to the clinic today for:   HPI     Retina Follow Up   Patient presents with  Diabetic Retinopathy.  In both eyes.  Severity is moderate.  Duration of 4 weeks.  Since onset it is stable.  I, the attending physician,  performed the HPI with the patient and updated documentation appropriately.        Comments   Pt here for 4 wk ret f/u NDPR OU. Pt states VA is the same, no changes. Pt not currently taking any eye drops.       Last edited by Rennis Chris, MD on 03/28/2023  5:25 PM.    Patient feels the vision is the same.   Referring physician: Michaelle Copas, MD [phone] [facility] [address],  [place] [address]  HISTORICAL INFORMATION:   Selected notes from the MEDICAL RECORD NUMBER Referred by Dr. Despina Arias for DM exam LEE:  Ocular Hx- PMH-    CURRENT MEDICATIONS: No current outpatient medications on file. (Ophthalmic Drugs)   No current facility-administered medications for this visit. (Ophthalmic Drugs)   Current Outpatient Medications (Other)  Medication Sig   aspirin 325 MG EC tablet Take 325 mg by mouth daily.   glipiZIDE (GLUCOTROL) 5 MG tablet TAKE 1 TABLET (5 MG TOTAL) BY MOUTH TWICE A DAY BEFORE MEALS   metoprolol succinate (TOPROL-XL) 50 MG 24 hr tablet Take 1 tablet (50 mg total) by mouth daily. Take with or immediately following a meal.   pantoprazole (PROTONIX) 40 MG tablet Take 1 tablet (40 mg total) by mouth daily.   rosuvastatin (CRESTOR) 10 MG tablet TAKE 1 TABLET BY MOUTH EVERY DAY   sitaGLIPtin-metformin (JANUMET) 50-500 MG tablet Take 1 tablet by mouth 2 (two) times daily with a meal.   No current facility-administered medications for this visit. (Other)   REVIEW OF SYSTEMS: ROS   Positive for: Endocrine, Eyes Negative for: Constitutional, Gastrointestinal,  Neurological, Skin, Genitourinary, Musculoskeletal, HENT, Cardiovascular, Respiratory, Psychiatric, Allergic/Imm, Heme/Lymph Last edited by Thompson Grayer, COT on 03/28/2023  1:18 PM.     ALLERGIES Allergies  Allergen Reactions   Penicillins    Zocor [Simvastatin]    PAST MEDICAL HISTORY Past Medical History:  Diagnosis Date   CAD (coronary artery disease)    1999 occluded LAD treated with PCI and stenting, 80% stenosis of the first diagonal feeling angioplasty, 60-70% second stenosis, 50% ostial left main stenosis, 25% right coronary artery stenosis, 50% ostial PDA stenosis.   Cancer [facility])    prostate (treated with radiation)   COVID-19    Diabetes mellitus without complication (HCC)    Hyperlipidemia    Hypertension    Joint pain    Past Surgical History:  Procedure Laterality Date   CARDIAC CATHETERIZATION     COLON SURGERY     hemrrhoids   CORONARY ANGIOPLASTY WITH STENT PLACEMENT     FAMILY HISTORY Family History  Problem Relation Age of Onset   Diabetes Mother    Stroke Mother    Cancer Father        skin, prostate, stomach   Healthy Daughter    Healthy Son    Asthma Son    SOCIAL HISTORY Social History   Tobacco Use   Smoking status: Former    Current  packs/day: 0.00    Types: Cigarettes    Quit date: 03/28/1998    Years since quitting: 25.0   Smokeless tobacco: Never  Vaping Use   Vaping status: Never Used  Substance Use Topics   Alcohol use: No   Drug use: No       OPHTHALMIC EXAM:  Base Eye Exam     Visual Acuity (Snellen - Linear)       Right Left   Dist cc 20/50 -3 20/20 -2   Dist ph cc 20/50 -1     Correction: Glasses         Tonometry (Tonopen, 1:25 PM)       Right Left   Pressure 15 12         Pupils       Pupils Dark Light Shape React APD   Right PERRL 3 2 Round Brisk None   Left PERRL 3 2 Round Brisk None         Visual Fields (Counting fingers)       Left Right    Full Full         Extraocular  Movement       Right Left    Full, Ortho Full, Ortho         Neuro/Psych     Oriented x3: Yes   Mood/Affect: Normal         Dilation     Both eyes: 1.0% Mydriacyl, 2.5% Phenylephrine @ 1:26 PM           Slit Lamp and Fundus Exam     Slit Lamp Exam       Right Left   Lids/Lashes Dermatochalasis - upper lid, Meibomian gland dysfunction Dermatochalasis - upper lid, Meibomian gland dysfunction   Conjunctiva/Sclera White and quiet nasal and temporal pinguecula   Cornea arcus Trace tear film debris   Anterior Chamber deep and clear deep and clear   Iris Round and dilated, No NVI Round and dilated, No NVI   Lens 2+ Nuclear sclerosis with brunescence, 2+ Cortical cataract 2+ Nuclear sclerosis with brunescence, 2+ Cortical cataract   Anterior Vitreous mild syneresis mild syneresis         Fundus Exam       Right Left   Disc Pink and Sharp, +PPP Pink and Sharp, mild PPP   C/D Ratio 0.2 0.2   Macula Blunted foveal reflex, central edema, scattered MA/DBH greatest centrally -- slightly increased Flat, Blunted foveal reflex, scattered MA   Vessels attenuated, Tortuous attenuated, Tortuous   Periphery Attached, 360 MA / DBH -- improved Attached, rare MA           Refraction     Wearing Rx       Sphere Cylinder Axis Add   Right -1.50 +0.75 114 +2.50   Left -1.50 +0.25 163 +2.50            IMAGING AND PROCEDURES  Imaging and Procedures for 03/28/2023  OCT, Retina - OU - Both Eyes       Right Eye Quality was good. Central Foveal Thickness: 414. Progression has worsened. Findings include no SRF, abnormal foveal contour, intraretinal hyper-reflective material, intraretinal fluid, vitreomacular adhesion (Mild interval increase in central IRF/IRHM ).   Left Eye Quality was good. Central Foveal Thickness: 306. Progression has been stable. Findings include normal foveal contour, no SRF, intraretinal fluid (Focal cystic changes ST mac).   Notes *Images captured  and stored on drive  Diagnosis / Impression:  OD: +  DME/CME - Mild interval increase in central IRF/IRHM  OS: Focal cystic changes ST mac  Clinical management:  See below  Abbreviations: NFP - Normal foveal profile. CME - cystoid macular edema. PED - pigment epithelial detachment. IRF - intraretinal fluid. SRF - subretinal fluid. EZ - ellipsoid zone. ERM - epiretinal membrane. ORA - outer retinal atrophy. ORT - outer retinal tubulation. SRHM - subretinal hyper-reflective material. IRHM - intraretinal hyper-reflective material      Intravitreal Injection, Pharmacologic Agent - OD - Right Eye       Time Out 03/28/2023. 2:37 PM. Confirmed correct patient, procedure, site, and patient consented.   Anesthesia Topical anesthesia was used. Anesthetic medications included Lidocaine 2%, Proparacaine 0.5%.   Procedure Preparation included 5% betadine to ocular surface, eyelid speculum. A (32g) needle was used.   Injection: 2 mg aflibercept 2 MG/0.05ML   Route: Intravitreal, Site: Right Eye   NDC: L6038910, Lot: [id number], Expiration date: 05/27/2024, Waste: 0 mL   Post-op Post injection exam found visual acuity of at least counting fingers. The patient tolerated the procedure well. There were no complications. The patient received written and verbal post procedure care education. Post injection medications were not given.            ASSESSMENT/PLAN:    ICD-10-CM   1. Moderate nonproliferative diabetic retinopathy of both eyes with macular edema associated with type 2 diabetes mellitus (HCC)  E11.3313 OCT, Retina - OU - Both Eyes    Intravitreal Injection, Pharmacologic Agent - OD - Right Eye    aflibercept (EYLEA) SOLN 2 mg    2. Long term (current) use of oral hypoglycemic drugs  Z79.84     3. Essential hypertension  I10     4. Hypertensive retinopathy of both eyes  H35.033     5. Combined forms of age-related cataract of both eyes  H25.813      1,2. Moderate  Non-proliferative diabetic retinopathy, both eyes (OD>OS)  - A1c 5.9 (07.05.24), 6.7 (04.01.24)+ - exam shows scattered MA OU (OD > OS) - s/p IVA OD #1 (04.08.24), #2 (05.06.24), #3 (06.03.24), #4 (07.01.24), #5 (07.29.24), #6 (08.26.24) IVA Resistance - FA (04.08.24) shows OD: scattered leaking MA greatest centrally; no NV; Large area of vascular non-perfusion temporal periphery with +perivascular leakage; OS: Scattered MA with late leakage, no NV - BCVA OD 20/50, OS 20/20 -- stable OU - OCT OD shows Mild interval increase in central IRF/IRHM; OS: Focal cystic changes ST mac **discussed decreased efficacy / resistance to Avastin and potential benefit of switching medication**  - recommend switching to IVE #1 OD today, 09.27.24 for DME - pt wishes to proceed - RBA of procedure discussed, questions answered - IVA informed consent obtained and signed, 04.08.24 (OD) - IVE informed consent obtained and signed, 09.30.24 (OD) - see procedure note - Eylea approved for 2024 - f/u in 4 wks -- DFE/OCT, possible injection  3,4. Hypertensive retinopathy OU - discussed importance of tight BP control - possible RVO/CME component to macular edema OD - monitor  5. Mixed Cataract OU - The symptoms of cataract, surgical options, and treatments and risks were discussed with patient. - discussed diagnosis and progression - monitor  Ophthalmic Meds Ordered this visit:  Meds ordered this encounter  Medications   aflibercept (EYLEA) SOLN 2 mg     Return in about 4 weeks (around 04/25/2023) for f/u NPDR OU, DFE, OCT, Possible, IVE, OD.  There are no Patient Instructions on file for this visit.  Explained the diagnoses,  plan, and follow up with the patient and they expressed understanding.  Patient expressed understanding of the importance of proper follow up care.   This document serves as a record of services personally performed by Karie Chimera, MD, PhD. It was created on their behalf by Annalee Genta, COMT. The creation of this record is the provider's dictation and/or activities during the visit.  Electronically signed by: Annalee Genta, COMT 03/28/23 5:26 PM  This document serves as a record of services personally performed by Karie Chimera, MD, PhD. It was created on their behalf by Charlette Caffey, COT an ophthalmic technician. The creation of this record is the provider's dictation and/or activities during the visit.    Electronically signed by:  Charlette Caffey, COT  03/28/23 5:26 PM  Karie Chimera, M.D., Ph.D. Diseases & Surgery of the Retina and Vitreous [facility]  I have reviewed the above documentation for accuracy and completeness, and I agree with the above. Karie Chimera, M.D., Ph.D. 03/28/23 5:27 PM   Abbreviations: M myopia (nearsighted); A astigmatism; H hyperopia (farsighted); P presbyopia; Mrx spectacle prescription;  CTL contact lenses; OD right eye; OS left eye; OU both eyes  XT exotropia; ET esotropia; PEK punctate epithelial keratitis; PEE punctate epithelial erosions; DES dry eye syndrome; MGD meibomian gland dysfunction; ATs artificial tears; PFAT's preservative free artificial tears; NSC nuclear sclerotic cataract; PSC posterior subcapsular cataract; ERM epi-retinal membrane; PVD posterior vitreous detachment; RD retinal detachment; DM diabetes mellitus; DR diabetic retinopathy; NPDR non-proliferative diabetic retinopathy; PDR proliferative diabetic retinopathy; CSME clinically significant macular edema; DME diabetic macular edema; dbh dot blot hemorrhages; CWS cotton wool spot; POAG primary open angle glaucoma; C/D cup-to-disc ratio; HVF humphrey visual field; GVF goldmann visual field; OCT optical coherence tomography; IOP intraocular pressure; BRVO Branch retinal vein occlusion; CRVO central retinal vein occlusion; CRAO central retinal artery occlusion; BRAO branch retinal artery occlusion; RT retinal tear; SB scleral  buckle; PPV pars plana vitrectomy; VH Vitreous hemorrhage; PRP panretinal laser photocoagulation; IVK intravitreal kenalog; VMT vitreomacular traction; MH Macular hole;  NVD neovascularization of the disc; NVE neovascularization elsewhere; AREDS age related eye disease study; ARMD age related macular degeneration; POAG primary open angle glaucoma; EBMD epithelial/anterior basement membrane dystrophy; ACIOL anterior chamber intraocular lens; IOL intraocular lens; PCIOL posterior chamber intraocular lens; Phaco/IOL phacoemulsification with intraocular lens placement; PRK photorefractive keratectomy; LASIK laser assisted in situ keratomileusis; HTN hypertension; DM diabetes mellitus; COPD chronic obstructive pulmonary disease

## 2023-03-28 ENCOUNTER — Encounter (INDEPENDENT_AMBULATORY_CARE_PROVIDER_SITE_OTHER): Payer: Self-pay | Admitting: Ophthalmology

## 2023-03-28 ENCOUNTER — Ambulatory Visit (INDEPENDENT_AMBULATORY_CARE_PROVIDER_SITE_OTHER): Payer: Medicare HMO | Admitting: Ophthalmology

## 2023-03-28 DIAGNOSIS — I10 Essential (primary) hypertension: Secondary | ICD-10-CM | POA: Diagnosis not present

## 2023-03-28 DIAGNOSIS — Z7984 Long term (current) use of oral hypoglycemic drugs: Secondary | ICD-10-CM | POA: Diagnosis not present

## 2023-03-28 DIAGNOSIS — H25813 Combined forms of age-related cataract, bilateral: Secondary | ICD-10-CM | POA: Diagnosis not present

## 2023-03-28 DIAGNOSIS — E113313 Type 2 diabetes mellitus with moderate nonproliferative diabetic retinopathy with macular edema, bilateral: Secondary | ICD-10-CM | POA: Diagnosis not present

## 2023-03-28 DIAGNOSIS — H35033 Hypertensive retinopathy, bilateral: Secondary | ICD-10-CM

## 2023-03-28 MED ORDER — AFLIBERCEPT 2MG/0.05ML IZ SOLN FOR KALEIDOSCOPE
2.0000 mg | INTRAVITREAL | Status: AC | PRN
Start: 2023-03-28 — End: 2023-03-28
  Administered 2023-03-28: 2 mg via INTRAVITREAL

## 2023-04-01 ENCOUNTER — Encounter: Payer: Self-pay | Admitting: Nurse Practitioner

## 2023-04-01 ENCOUNTER — Ambulatory Visit (INDEPENDENT_AMBULATORY_CARE_PROVIDER_SITE_OTHER): Payer: Medicare HMO | Admitting: Nurse Practitioner

## 2023-04-01 VITALS — BP 130/74 | HR 70 | Temp 97.4°F | Ht 70.0 in | Wt 176.0 lb

## 2023-04-01 DIAGNOSIS — K219 Gastro-esophageal reflux disease without esophagitis: Secondary | ICD-10-CM | POA: Diagnosis not present

## 2023-04-01 DIAGNOSIS — Z7984 Long term (current) use of oral hypoglycemic drugs: Secondary | ICD-10-CM

## 2023-04-01 DIAGNOSIS — I10 Essential (primary) hypertension: Secondary | ICD-10-CM

## 2023-04-01 DIAGNOSIS — E1169 Type 2 diabetes mellitus with other specified complication: Secondary | ICD-10-CM | POA: Diagnosis not present

## 2023-04-01 DIAGNOSIS — E119 Type 2 diabetes mellitus without complications: Secondary | ICD-10-CM

## 2023-04-01 DIAGNOSIS — Z23 Encounter for immunization: Secondary | ICD-10-CM | POA: Diagnosis not present

## 2023-04-01 DIAGNOSIS — Z6827 Body mass index (BMI) 27.0-27.9, adult: Secondary | ICD-10-CM | POA: Diagnosis not present

## 2023-04-01 DIAGNOSIS — E785 Hyperlipidemia, unspecified: Secondary | ICD-10-CM

## 2023-04-01 LAB — BAYER DCA HB A1C WAIVED: HB A1C (BAYER DCA - WAIVED): 5.5 % (ref 4.8–5.6)

## 2023-04-01 NOTE — Patient Instructions (Signed)

## 2023-04-01 NOTE — Progress Notes (Signed)
Subjective:    Patient ID: ESVIN HNAT, male    DOB: [birth year], 72 y.o.   MRN: [id number]   Chief Complaint: medical management of chronic issues     HPI:  Arthur Torres is a 72 y.o. who identifies as a male who was assigned male at birth.   Social history: Lives with: wife Work history: repairs motors   Comes in today for follow up of the following chronic medical issues:  1. Essential hypertension, benign No c/o chest pain, sob or headache. Does not check blood pressure at home. BP Readings from Last 3 Encounters:  12/31/22 114/63  12/13/22 109/65  09/27/22 130/69     2. Hyperlipidemia associated with type 2 diabetes mellitus (HCC) Tries to watch diet but does no dedicated exercise. Lab Results  Component Value Date   CHOL 129 12/31/2022   HDL 44 12/31/2022   LDLCALC 67 12/31/2022   TRIG 97 12/31/2022   CHOLHDL 2.9 12/31/2022     3. Diabetes mellitus treated with oral medication (HCC) He does not check his blood sugars at home. We halfed his glipizide at last visit Lab Results  Component Value Date   HGBA1C 5.9 (H) 12/31/2022     4. Gastroesophageal reflux disease without esophagitis Is on protonix and is doing well.  5. BMI 27.0-27.9,adult No recent weight changes Wt Readings from Last 3 Encounters:  04/01/23 176 lb (79.8 kg)  01/26/23 179 lb (81.2 kg)  12/31/22 179 lb (81.2 kg)   BMI Readings from Last 3 Encounters:  04/01/23 25.25 kg/m  01/26/23 25.68 kg/m  12/31/22 25.68 kg/m       New complaints: None today  Allergies  Allergen Reactions   Penicillins    Zocor [Simvastatin]    Outpatient Encounter Medications as of 04/01/2023  Medication Sig   aspirin 325 MG EC tablet Take 325 mg by mouth daily.   glipiZIDE (GLUCOTROL) 5 MG tablet TAKE 1 TABLET (5 MG TOTAL) BY MOUTH TWICE A DAY BEFORE MEALS   metoprolol succinate (TOPROL-XL) 50 MG 24 hr tablet Take 1 tablet (50 mg total) by mouth daily. Take with or immediately following  a meal.   pantoprazole (PROTONIX) 40 MG tablet Take 1 tablet (40 mg total) by mouth daily.   rosuvastatin (CRESTOR) 10 MG tablet TAKE 1 TABLET BY MOUTH EVERY DAY   sitaGLIPtin-metformin (JANUMET) 50-500 MG tablet Take 1 tablet by mouth 2 (two) times daily with a meal.   No facility-administered encounter medications on file as of 04/01/2023.    Past Surgical History:  Procedure Laterality Date   CARDIAC CATHETERIZATION     COLON SURGERY     hemrrhoids   CORONARY ANGIOPLASTY WITH STENT PLACEMENT      Family History  Problem Relation Age of Onset   Diabetes Mother    Stroke Mother    Cancer Father        skin, prostate, stomach   Healthy Daughter    Healthy Son    Asthma Son       Controlled substance contract: n/a     Review of Systems  Constitutional:  Negative for diaphoresis.  Eyes:  Negative for pain.  Respiratory:  Negative for shortness of breath.   Cardiovascular:  Negative for chest pain, palpitations and leg swelling.  Gastrointestinal:  Negative for abdominal pain.  Endocrine: Negative for polydipsia.  Skin:  Negative for rash.  Neurological:  Negative for dizziness, weakness and headaches.  Hematological:  Does not bruise/bleed easily.  All other  systems reviewed and are negative.      Objective:   Physical Exam Vitals and nursing note reviewed.  Constitutional:      Appearance: Normal appearance. He is well-developed.  HENT:     Head: Normocephalic.     Nose: Nose normal.     Mouth/Throat:     Mouth: Mucous membranes are moist.     Pharynx: Oropharynx is clear.  Eyes:     Pupils: Pupils are equal, round, and reactive to light.  Neck:     Thyroid: No thyroid mass or thyromegaly.     Vascular: No carotid bruit or JVD.     Trachea: Phonation normal.  Cardiovascular:     Rate and Rhythm: Normal rate and regular rhythm.  Pulmonary:     Effort: Pulmonary effort is normal. No respiratory distress.     Breath sounds: Normal breath sounds.   Abdominal:     General: Bowel sounds are normal.     Palpations: Abdomen is soft.     Tenderness: There is no abdominal tenderness.  Musculoskeletal:        General: Normal range of motion.     Cervical back: Normal range of motion and neck supple.  Lymphadenopathy:     Cervical: No cervical adenopathy.  Skin:    General: Skin is warm and dry.  Neurological:     Mental Status: He is alert and oriented to person, place, and time.  Psychiatric:        Behavior: Behavior normal.        Thought Content: Thought content normal.        Judgment: Judgment normal.     BP 130/74   Pulse 70   Temp (!) 97.4 F (36.3 C) (Skin)   Ht 5\' 10"  (1.778 m)   Wt 176 lb (79.8 kg)   BMI 25.25 kg/m    Hgba1c 5.5%     Assessment & Plan:   Arthur Torres comes in today with chief complaint of Medical Management of Chronic Issues   Diagnosis and orders addressed:  1. Essential hypertension, benign Low sodium diet - CBC with Differential/Platelet - CMP14+EGFR  2. Hyperlipidemia associated with type 2 diabetes mellitus (HCC) Low fat diet - Lipid panel  3. Diabetes mellitus treated with oral medication (HCC) Continue to watch carbs in diet - Bayer DCA Hb A1c Waived  4. Gastroesophageal reflux disease without esophagitis Avoid spicy foods Do not eat 2 hours prior to bedtime   5. BMI 27.0-27.9,adult Discussed diet and exercise for person with BMI >25 Will recheck weight in 3-6 months    Labs pending Health Maintenance reviewed Diet and exercise encouraged  Follow up plan: 3 months   Mary-Margaret Daphine Deutscher, FNP

## 2023-04-02 LAB — CBC WITH DIFFERENTIAL/PLATELET
Basophils Absolute: 0.1 10*3/uL (ref 0.0–0.2)
Basos: 1 %
EOS (ABSOLUTE): 0.1 10*3/uL (ref 0.0–0.4)
Eos: 2 %
Hematocrit: 47.2 % (ref 37.5–51.0)
Hemoglobin: 15.2 g/dL (ref 13.0–17.7)
Immature Grans (Abs): 0 10*3/uL (ref 0.0–0.1)
Immature Granulocytes: 0 %
Lymphocytes Absolute: 2 10*3/uL (ref 0.7–3.1)
Lymphs: 34 %
MCH: 28.4 pg (ref 26.6–33.0)
MCHC: 32.2 g/dL (ref 31.5–35.7)
MCV: 88 fL (ref 79–97)
Monocytes Absolute: 0.4 10*3/uL (ref 0.1–0.9)
Monocytes: 7 %
Neutrophils Absolute: 3.2 10*3/uL (ref 1.4–7.0)
Neutrophils: 56 %
Platelets: 167 10*3/uL (ref 150–450)
RBC: 5.36 x10E6/uL (ref 4.14–5.80)
RDW: 13.1 % (ref 11.6–15.4)
WBC: 5.7 10*3/uL (ref 3.4–10.8)

## 2023-04-02 LAB — CMP14+EGFR
ALT: 21 [IU]/L (ref 0–44)
AST: 16 [IU]/L (ref 0–40)
Albumin: 4.4 g/dL (ref 3.9–4.9)
Alkaline Phosphatase: 77 [IU]/L (ref 44–121)
BUN/Creatinine Ratio: 22 (ref 10–24)
BUN: 29 mg/dL — ABNORMAL HIGH (ref 8–27)
Bilirubin Total: 0.5 mg/dL (ref 0.0–1.2)
CO2: 20 mmol/L (ref 20–29)
Calcium: 9.9 mg/dL (ref 8.6–10.2)
Chloride: 100 mmol/L (ref 96–106)
Creatinine, Ser: 1.31 mg/dL — ABNORMAL HIGH (ref 0.76–1.27)
Globulin, Total: 2.6 g/dL (ref 1.5–4.5)
Glucose: 107 mg/dL — ABNORMAL HIGH (ref 70–99)
Potassium: 4.7 mmol/L (ref 3.5–5.2)
Sodium: 138 mmol/L (ref 134–144)
Total Protein: 7 g/dL (ref 6.0–8.5)
eGFR: 59 mL/min/{1.73_m2} — ABNORMAL LOW (ref >59)

## 2023-04-02 LAB — LIPID PANEL
Cholesterol, Total: 135 mg/dL (ref 100–199)
HDL: 45 mg/dL (ref >39)
LDL CALC COMMENT:: 3 ratio (ref 0.0–5.0)
LDL Chol Calc (NIH): 75 mg/dL (ref 0–99)
Triglycerides: 78 mg/dL (ref 0–149)
VLDL Cholesterol Cal: 15 mg/dL (ref 5–40)

## 2023-04-22 NOTE — Progress Notes (Signed)
[facility] - Clinic Note  04/25/2023     CHIEF COMPLAINT Patient presents for Retina Follow Up   HISTORY OF PRESENT ILLNESS: Arthur Torres is a 72 y.o. male who presents to the clinic today for:   HPI     Retina Follow Up   Patient presents with  Diabetic Retinopathy.  In both eyes.  This started months ago.  Severity is moderate.  Duration of 4 weeks.  Since onset it is stable.  I, the attending physician,  performed the HPI with the patient and updated documentation appropriately.        Comments   Patient feels the vision is the same. He is not using eye drops. He does not check his blood sugar.       Last edited by Rennis Chris, MD on 04/25/2023  4:47 PM.     Patient states everything is distorted with the vision in his right eye  Referring physician: Bennie Pierini, FNP [address] [place],  [place] [address]  HISTORICAL INFORMATION:   Selected notes from the MEDICAL RECORD NUMBER Referred by Dr. Despina Arias for DM exam LEE:  Ocular Hx- PMH-    CURRENT MEDICATIONS: No current outpatient medications on file. (Ophthalmic Drugs)   No current facility-administered medications for this visit. (Ophthalmic Drugs)   Current Outpatient Medications (Other)  Medication Sig   aspirin 325 MG EC tablet Take 325 mg by mouth daily.   glipiZIDE (GLUCOTROL) 5 MG tablet TAKE 1 TABLET (5 MG TOTAL) BY MOUTH TWICE A DAY BEFORE MEALS   metoprolol succinate (TOPROL-XL) 50 MG 24 hr tablet Take 1 tablet (50 mg total) by mouth daily. Take with or immediately following a meal.   pantoprazole (PROTONIX) 40 MG tablet Take 1 tablet (40 mg total) by mouth daily.   rosuvastatin (CRESTOR) 10 MG tablet TAKE 1 TABLET BY MOUTH EVERY DAY   sitaGLIPtin-metformin (JANUMET) 50-500 MG tablet Take 1 tablet by mouth 2 (two) times daily with a meal.   No current facility-administered medications for this visit. (Other)   REVIEW OF SYSTEMS: ROS   Positive for:  Endocrine, Eyes Negative for: Constitutional, Gastrointestinal, Neurological, Skin, Genitourinary, Musculoskeletal, HENT, Cardiovascular, Respiratory, Psychiatric, Allergic/Imm, Heme/Lymph Last edited by Charlette Caffey, COT on 04/25/2023  1:35 PM.      ALLERGIES Allergies  Allergen Reactions   Penicillins    Zocor [Simvastatin]    PAST MEDICAL HISTORY Past Medical History:  Diagnosis Date   CAD (coronary artery disease)    1999 occluded LAD treated with PCI and stenting, 80% stenosis of the first diagonal feeling angioplasty, 60-70% second stenosis, 50% ostial left main stenosis, 25% right coronary artery stenosis, 50% ostial PDA stenosis.   Cancer [facility])    prostate (treated with radiation)   COVID-19    Diabetes mellitus without complication (HCC)    Hyperlipidemia    Hypertension    Joint pain    Past Surgical History:  Procedure Laterality Date   CARDIAC CATHETERIZATION     COLON SURGERY     hemrrhoids   CORONARY ANGIOPLASTY WITH STENT PLACEMENT     FAMILY HISTORY Family History  Problem Relation Age of Onset   Diabetes Mother    Stroke Mother    Cancer Father        skin, prostate, stomach   Healthy Daughter    Healthy Son    Asthma Son    SOCIAL HISTORY Social History   Tobacco Use   Smoking status:  Former    Current packs/day: 0.00    Types: Cigarettes    Quit date: 03/28/1998    Years since quitting: 25.0   Smokeless tobacco: Never  Vaping Use   Vaping status: Never Used  Substance Use Topics   Alcohol use: No   Drug use: No       OPHTHALMIC EXAM:  Base Eye Exam     Visual Acuity (Snellen - Linear)       Right Left   Dist cc 20/50 20/20   Dist ph cc NI     Correction: Glasses         Tonometry (Tonopen, 1:39 PM)       Right Left   Pressure 14 14         Pupils       Dark Light Shape React APD   Right 3 2 Round Brisk None   Left 3 2 Round Brisk None         Visual Fields       Left Right    Full Full          Extraocular Movement       Right Left    Full, Ortho Full, Ortho         Neuro/Psych     Oriented x3: Yes   Mood/Affect: Normal         Dilation     Both eyes: 1.0% Mydriacyl, 2.5% Phenylephrine @ 1:36 PM           Slit Lamp and Fundus Exam     Slit Lamp Exam       Right Left   Lids/Lashes Dermatochalasis - upper lid, Meibomian gland dysfunction Dermatochalasis - upper lid, Meibomian gland dysfunction   Conjunctiva/Sclera White and quiet nasal and temporal pinguecula   Cornea arcus Trace tear film debris   Anterior Chamber deep and clear deep and clear   Iris Round and dilated, No NVI Round and dilated, No NVI   Lens 2+ Nuclear sclerosis with brunescence, 2+ Cortical cataract 2+ Nuclear sclerosis with brunescence, 2+ Cortical cataract   Anterior Vitreous mild syneresis mild syneresis         Fundus Exam       Right Left   Disc Pink and Sharp, +PPP Pink and Sharp, mild PPP   C/D Ratio 0.2 0.2   Macula Blunted foveal reflex, central edema, scattered MA/DBH greatest centrally -- slightly improved Flat, Blunted foveal reflex, scattered MA, trace cystic changes   Vessels attenuated, Tortuous attenuated, Tortuous   Periphery Attached, 360 MA / DBH -- improved Attached, rare MA           Refraction     Wearing Rx       Sphere Cylinder Axis Add   Right -1.50 +0.75 114 +2.50   Left -1.50 +0.25 163 +2.50            IMAGING AND PROCEDURES  Imaging and Procedures for 04/25/2023  OCT, Retina - OU - Both Eyes       Right Eye Quality was good. Central Foveal Thickness: 367. Progression has improved. Findings include no SRF, abnormal foveal contour, intraretinal hyper-reflective material, intraretinal fluid, vitreomacular adhesion (Mild interval improvement in central IRF/IRHM ).   Left Eye Quality was good. Central Foveal Thickness: 315. Progression has worsened. Findings include normal foveal contour, no SRF, intraretinal fluid (Focal cystic  changes ST mac -- slightly increased).   Notes *Images captured and stored on drive  Diagnosis / Impression:  OD: +DME/CME - Mild interval improvement in central IRF/IRHM  OS: Focal cystic changes ST mac -- slightly increased  Clinical management:  See below  Abbreviations: NFP - Normal foveal profile. CME - cystoid macular edema. PED - pigment epithelial detachment. IRF - intraretinal fluid. SRF - subretinal fluid. EZ - ellipsoid zone. ERM - epiretinal membrane. ORA - outer retinal atrophy. ORT - outer retinal tubulation. SRHM - subretinal hyper-reflective material. IRHM - intraretinal hyper-reflective material      Intravitreal Injection, Pharmacologic Agent - OD - Right Eye       Time Out 04/25/2023. 2:26 PM. Confirmed correct patient, procedure, site, and patient consented.   Anesthesia Topical anesthesia was used. Anesthetic medications included Lidocaine 2%, Proparacaine 0.5%.   Procedure Preparation included 5% betadine to ocular surface, eyelid speculum. A (32g) needle was used.   Injection: 2 mg aflibercept 2 MG/0.05ML   Route: Intravitreal, Site: Right Eye   NDC: L6038910, Lot: [id number], Expiration date: 02/26/2024, Waste: 0 mL   Post-op Post injection exam found visual acuity of at least counting fingers. The patient tolerated the procedure well. There were no complications. The patient received written and verbal post procedure care education. Post injection medications were not given.            ASSESSMENT/PLAN:    ICD-10-CM   1. Moderate nonproliferative diabetic retinopathy of both eyes with macular edema associated with type 2 diabetes mellitus (HCC)  E11.3313 OCT, Retina - OU - Both Eyes    Intravitreal Injection, Pharmacologic Agent - OD - Right Eye    aflibercept (EYLEA) SOLN 2 mg    2. Long term (current) use of oral hypoglycemic drugs  Z79.84     3. Essential hypertension  I10     4. Hypertensive retinopathy of both eyes  H35.033     5.  Combined forms of age-related cataract of both eyes  H25.813      1,2. Moderate Non-proliferative diabetic retinopathy, both eyes (OD>OS)  - A1c 5.9 (07.05.24) - exam shows scattered MA OU (OD > OS) - s/p IVA OD #1 (04.08.24), #2 (05.06.24), #3 (06.03.24), #4 (07.01.24), #5 (07.29.24), #6 (08.26.24)--IVA Resistance - s/p IVE OD #1 (09.27.24) - FA (04.08.24) shows OD: scattered leaking MA greatest centrally; no NV; Large area of vascular non-perfusion temporal periphery with +perivascular leakage; OS: Scattered MA with late leakage, no NV - BCVA OD 20/50, OS 20/20 -- stable OU - OCT OD shows Mild interval improvement in central IRF/IRHM; OS: Focal cystic changes -- slightly increased ST mac - recommend IVE #2 OD today, 10.28.24 for DME - pt wishes to proceed - RBA of procedure discussed, questions answered - IVE informed consent obtained and signed, 09.30.24 (OD) - see procedure note - Eylea approved for 2024 - f/u in 4 wks -- DFE/OCT, possible injection  3,4. Hypertensive retinopathy OU - discussed importance of tight BP control - possible RVO/CME component to macular edema OD - monitor  5. Mixed Cataract OU - The symptoms of cataract, surgical options, and treatments and risks were discussed with patient. - discussed diagnosis and progression - monitor  Ophthalmic Meds Ordered this visit:  Meds ordered this encounter  Medications   aflibercept (EYLEA) SOLN 2 mg     Return in about 4 weeks (around 05/23/2023) for +DME, Dilated Exam, OCT, Possible Injxn.  There are no Patient Instructions on file for this visit.  Explained the diagnoses, plan, and follow up with the patient and they expressed understanding.  Patient expressed understanding of the  importance of proper follow up care.   This document serves as a record of services personally performed by Karie Chimera, MD, PhD. It was created on their behalf by Annalee Genta, COMT. The creation of this record is the provider's  dictation and/or activities during the visit.  Electronically signed by: Annalee Genta, COMT 04/26/23 10:56 PM  This document serves as a record of services personally performed by Karie Chimera, MD, PhD. It was created on their behalf by Glee Arvin. Manson Passey, OA an ophthalmic technician. The creation of this record is the provider's dictation and/or activities during the visit.    Electronically signed by: Glee Arvin. Manson Passey, OA 04/26/23 10:56 PM  Karie Chimera, M.D., Ph.D. Diseases & Surgery of the Retina and Vitreous [facility]  I have reviewed the above documentation for accuracy and completeness, and I agree with the above. Karie Chimera, M.D., Ph.D. 04/26/23 10:58 PM    Abbreviations: M myopia (nearsighted); A astigmatism; H hyperopia (farsighted); P presbyopia; Mrx spectacle prescription;  CTL contact lenses; OD right eye; OS left eye; OU both eyes  XT exotropia; ET esotropia; PEK punctate epithelial keratitis; PEE punctate epithelial erosions; DES dry eye syndrome; MGD meibomian gland dysfunction; ATs artificial tears; PFAT's preservative free artificial tears; NSC nuclear sclerotic cataract; PSC posterior subcapsular cataract; ERM epi-retinal membrane; PVD posterior vitreous detachment; RD retinal detachment; DM diabetes mellitus; DR diabetic retinopathy; NPDR non-proliferative diabetic retinopathy; PDR proliferative diabetic retinopathy; CSME clinically significant macular edema; DME diabetic macular edema; dbh dot blot hemorrhages; CWS cotton wool spot; POAG primary open angle glaucoma; C/D cup-to-disc ratio; HVF humphrey visual field; GVF goldmann visual field; OCT optical coherence tomography; IOP intraocular pressure; BRVO Branch retinal vein occlusion; CRVO central retinal vein occlusion; CRAO central retinal artery occlusion; BRAO branch retinal artery occlusion; RT retinal tear; SB scleral buckle; PPV pars plana vitrectomy; VH Vitreous hemorrhage; PRP panretinal  laser photocoagulation; IVK intravitreal kenalog; VMT vitreomacular traction; MH Macular hole;  NVD neovascularization of the disc; NVE neovascularization elsewhere; AREDS age related eye disease study; ARMD age related macular degeneration; POAG primary open angle glaucoma; EBMD epithelial/anterior basement membrane dystrophy; ACIOL anterior chamber intraocular lens; IOL intraocular lens; PCIOL posterior chamber intraocular lens; Phaco/IOL phacoemulsification with intraocular lens placement; PRK photorefractive keratectomy; LASIK laser assisted in situ keratomileusis; HTN hypertension; DM diabetes mellitus; COPD chronic obstructive pulmonary disease

## 2023-04-25 ENCOUNTER — Ambulatory Visit (INDEPENDENT_AMBULATORY_CARE_PROVIDER_SITE_OTHER): Payer: Medicare HMO | Admitting: Ophthalmology

## 2023-04-25 ENCOUNTER — Encounter (INDEPENDENT_AMBULATORY_CARE_PROVIDER_SITE_OTHER): Payer: Self-pay | Admitting: Ophthalmology

## 2023-04-25 DIAGNOSIS — Z7984 Long term (current) use of oral hypoglycemic drugs: Secondary | ICD-10-CM | POA: Diagnosis not present

## 2023-04-25 DIAGNOSIS — H25813 Combined forms of age-related cataract, bilateral: Secondary | ICD-10-CM

## 2023-04-25 DIAGNOSIS — H35033 Hypertensive retinopathy, bilateral: Secondary | ICD-10-CM | POA: Diagnosis not present

## 2023-04-25 DIAGNOSIS — E113313 Type 2 diabetes mellitus with moderate nonproliferative diabetic retinopathy with macular edema, bilateral: Secondary | ICD-10-CM | POA: Diagnosis not present

## 2023-04-25 DIAGNOSIS — I10 Essential (primary) hypertension: Secondary | ICD-10-CM

## 2023-04-25 MED ORDER — AFLIBERCEPT 2MG/0.05ML IZ SOLN FOR KALEIDOSCOPE
2.0000 mg | INTRAVITREAL | Status: AC | PRN
  Administered 2023-04-25: 2 mg via INTRAVITREAL

## 2023-04-26 DIAGNOSIS — M79676 Pain in unspecified toe(s): Secondary | ICD-10-CM | POA: Diagnosis not present

## 2023-04-26 DIAGNOSIS — L84 Corns and callosities: Secondary | ICD-10-CM | POA: Diagnosis not present

## 2023-04-26 DIAGNOSIS — L603 Nail dystrophy: Secondary | ICD-10-CM | POA: Diagnosis not present

## 2023-04-26 DIAGNOSIS — E1142 Type 2 diabetes mellitus with diabetic polyneuropathy: Secondary | ICD-10-CM | POA: Diagnosis not present

## 2023-05-11 NOTE — Progress Notes (Signed)
[facility] - Clinic Note  05/23/2023     CHIEF COMPLAINT Patient presents for Retina Follow Up   HISTORY OF PRESENT ILLNESS: Arthur Torres is a 72 y.o. male who presents to the clinic today for:   HPI     Retina Follow Up   Patient presents with  Diabetic Retinopathy.  In both eyes.  This started months ago.  Severity is moderate.  Duration of 4 weeks.  Since onset it is stable.  I, the attending physician,  performed the HPI with the patient and updated documentation appropriately.        Comments   Patient feels the vision is a little better without glasses. He is not using eye drops. He does not check his blood sugar.      Last edited by Rennis Chris, MD on 05/24/2023  5:52 PM.    Patient has not noticed any change in vision, he states he can see better without his glasses than with his glasses  Referring physician: Bennie Pierini, FNP [address] [place],  [place] [address]  HISTORICAL INFORMATION:   Selected notes from the MEDICAL RECORD NUMBER Referred by Dr. Despina Arias for DM exam LEE:  Ocular Hx- PMH-    CURRENT MEDICATIONS: No current outpatient medications on file. (Ophthalmic Drugs)   No current facility-administered medications for this visit. (Ophthalmic Drugs)   Current Outpatient Medications (Other)  Medication Sig   aspirin 325 MG EC tablet Take 325 mg by mouth daily.   glipiZIDE (GLUCOTROL) 5 MG tablet TAKE 1 TABLET (5 MG TOTAL) BY MOUTH TWICE A DAY BEFORE MEALS   metoprolol succinate (TOPROL-XL) 50 MG 24 hr tablet Take 1 tablet (50 mg total) by mouth daily. Take with or immediately following a meal.   pantoprazole (PROTONIX) 40 MG tablet Take 1 tablet (40 mg total) by mouth daily.   rosuvastatin (CRESTOR) 10 MG tablet TAKE 1 TABLET BY MOUTH EVERY DAY   sitaGLIPtin-metformin (JANUMET) 50-500 MG tablet Take 1 tablet by mouth 2 (two) times daily with a meal.   No current facility-administered medications for this  visit. (Other)   REVIEW OF SYSTEMS: ROS   Positive for: Endocrine, Eyes Negative for: Constitutional, Gastrointestinal, Neurological, Skin, Genitourinary, Musculoskeletal, HENT, Cardiovascular, Respiratory, Psychiatric, Allergic/Imm, Heme/Lymph Last edited by Charlette Caffey, COT on 05/23/2023  1:01 PM.     ALLERGIES Allergies  Allergen Reactions   Penicillins    Zocor [Simvastatin]    PAST MEDICAL HISTORY Past Medical History:  Diagnosis Date   CAD (coronary artery disease)    1999 occluded LAD treated with PCI and stenting, 80% stenosis of the first diagonal feeling angioplasty, 60-70% second stenosis, 50% ostial left main stenosis, 25% right coronary artery stenosis, 50% ostial PDA stenosis.   Cancer [facility])    prostate (treated with radiation)   COVID-19    Diabetes mellitus without complication (HCC)    Hyperlipidemia    Hypertension    Joint pain    Past Surgical History:  Procedure Laterality Date   CARDIAC CATHETERIZATION     COLON SURGERY     hemrrhoids   CORONARY ANGIOPLASTY WITH STENT PLACEMENT     FAMILY HISTORY Family History  Problem Relation Age of Onset   Diabetes Mother    Stroke Mother    Cancer Father        skin, prostate, stomach   Healthy Daughter    Healthy Son    Asthma Son    SOCIAL HISTORY Social  History   Tobacco Use   Smoking status: Former    Current packs/day: 0.00    Types: Cigarettes    Quit date: 03/28/1998    Years since quitting: 25.1   Smokeless tobacco: Never  Vaping Use   Vaping status: Never Used  Substance Use Topics   Alcohol use: No   Drug use: No       OPHTHALMIC EXAM:  Base Eye Exam     Visual Acuity (Snellen - Linear)       Right Left   Dist [place] 20/50 -2 20/20   Dist cc 20/60 +2    Dist ph [place] 20/40 +2    Dist ph cc 20/40 +1     Correction: Glasses         Tonometry (Tonopen, 1:07 PM)       Right Left   Pressure 15 14         Pupils       Dark Light Shape React APD   Right 3 2 Round  Brisk None   Left 3 2 Round Brisk None         Visual Fields       Left Right    Full Full         Extraocular Movement       Right Left    Full, Ortho Full, Ortho         Neuro/Psych     Oriented x3: Yes   Mood/Affect: Normal         Dilation     Both eyes: 2.5% Phenylephrine @ 1:01 PM           Slit Lamp and Fundus Exam     Slit Lamp Exam       Right Left   Lids/Lashes Dermatochalasis - upper lid, Meibomian gland dysfunction Dermatochalasis - upper lid, Meibomian gland dysfunction   Conjunctiva/Sclera White and quiet nasal and temporal pinguecula   Cornea arcus Trace tear film debris   Anterior Chamber deep and clear deep and clear   Iris Round and dilated, No NVI Round and dilated, No NVI   Lens 2+ Nuclear sclerosis with brunescence, 2+ Cortical cataract 2+ Nuclear sclerosis with brunescence, 2+ Cortical cataract   Anterior Vitreous mild syneresis mild syneresis         Fundus Exam       Right Left   Disc Pink and Sharp, +PPP Pink and Sharp, mild PPP   C/D Ratio 0.2 0.2   Macula Blunted foveal reflex, central edema -- slightly improved, scattered MA/DBH greatest centrally -- slightly improved Flat, Blunted foveal reflex, scattered MA, trace cystic changes -- slightly improved   Vessels attenuated, Tortuous attenuated, Tortuous   Periphery Attached, 360 MA / DBH -- improved Attached, rare MA           Refraction     Wearing Rx       Sphere Cylinder Axis Add   Right -1.50 +0.75 114 +2.50   Left -1.50 +0.25 163 +2.50            IMAGING AND PROCEDURES  Imaging and Procedures for 05/23/2023  OCT, Retina - OU - Both Eyes       Right Eye Quality was good. Central Foveal Thickness: 361. Progression has improved. Findings include no SRF, abnormal foveal contour, intraretinal hyper-reflective material, intraretinal fluid, vitreomacular adhesion (Persistent central IRF/IRHM -- slightly improved).   Left Eye Quality was good. Central  Foveal Thickness: 311. Progression has improved. Findings  include normal foveal contour, no SRF, intraretinal fluid (Focal cystic changes ST mac -- slightly improved).   Notes *Images captured and stored on drive  Diagnosis / Impression:  OD: +DME/CME - Persistent central IRF/IRHM -- slightly improved OS: Focal cystic changes ST mac -- slightly improved  Clinical management:  See below  Abbreviations: NFP - Normal foveal profile. CME - cystoid macular edema. PED - pigment epithelial detachment. IRF - intraretinal fluid. SRF - subretinal fluid. EZ - ellipsoid zone. ERM - epiretinal membrane. ORA - outer retinal atrophy. ORT - outer retinal tubulation. SRHM - subretinal hyper-reflective material. IRHM - intraretinal hyper-reflective material      Intravitreal Injection, Pharmacologic Agent - OD - Right Eye       Time Out 05/23/2023. 1:29 PM. Confirmed correct patient, procedure, site, and patient consented.   Anesthesia Topical anesthesia was used. Anesthetic medications included Lidocaine 2%, Proparacaine 0.5%.   Procedure Preparation included 5% betadine to ocular surface, eyelid speculum. A (32g) needle was used.   Injection: 2 mg aflibercept 2 MG/0.05ML   Route: Intravitreal, Site: Right Eye   NDC: L6038910, Lot: [id number], Expiration date: 09/25/2024, Waste: 0 mL   Post-op Post injection exam found visual acuity of at least counting fingers. The patient tolerated the procedure well. There were no complications. The patient received written and verbal post procedure care education. Post injection medications were not given.             ASSESSMENT/PLAN:    ICD-10-CM   1. Moderate nonproliferative diabetic retinopathy of both eyes with macular edema associated with type 2 diabetes mellitus (HCC)  E11.3313 OCT, Retina - OU - Both Eyes    Intravitreal Injection, Pharmacologic Agent - OD - Right Eye    aflibercept (EYLEA) SOLN 2 mg    2. Long term (current) use of  oral hypoglycemic drugs  Z79.84     3. Essential hypertension  I10     4. Hypertensive retinopathy of both eyes  H35.033     5. Combined forms of age-related cataract of both eyes  H25.813      1,2. Moderate Non-proliferative diabetic retinopathy, both eyes (OD>OS)  - A1c 5.9 (07.05.24) - exam shows scattered MA OU (OD > OS) - s/p IVA OD #1 (04.08.24), #2 (05.06.24), #3 (06.03.24), #4 (07.01.24), #5 (07.29.24), #6 (08.26.24)--IVA Resistance - s/p IVE OD #1 (09.27.24), #2 (10.28.24) - FA (04.08.24) shows OD: scattered leaking MA greatest centrally; no NV; Large area of vascular non-perfusion temporal periphery with +perivascular leakage; OS: Scattered MA with late leakage, no NV - BCVA OD improved to 20/40 from 20/50, OS 20/20 -- stable - OCT OD shows OD: +DME/CME - Persistent central IRF/IRHM -- slightly improved; OS: Focal cystic changes ST mac -- slightly improved - recommend IVE OD #3 today, 11.25.24 for DME - pt wishes to proceed - RBA of procedure discussed, questions answered - IVE informed consent obtained and signed, 09.30.24 (OD) - see procedure note - Eylea approved for 2024 - f/u in 5 wks -- DFE/OCT, possible injection  3,4. Hypertensive retinopathy OU - discussed importance of tight BP control - possible RVO/CME component to macular edema OD - monitor  5. Mixed Cataract OU - The symptoms of cataract, surgical options, and treatments and risks were discussed with patient. - discussed diagnosis and progression - will refer to [facility] for consult  Ophthalmic Meds Ordered this visit:  Meds ordered this encounter  Medications   aflibercept (EYLEA) SOLN 2 mg  Return in about 5 weeks (around 06/27/2023) for f/u NPDR OU, DFE, OCT.  There are no Patient Instructions on file for this visit.  Explained the diagnoses, plan, and follow up with the patient and they expressed understanding.  Patient expressed understanding of the importance of proper  follow up care.   This document serves as a record of services personally performed by Karie Chimera, MD, PhD. It was created on their behalf by Glee Arvin. Manson Passey, OA an ophthalmic technician. The creation of this record is the provider's dictation and/or activities during the visit.    Electronically signed by: Glee Arvin. Manson Passey, OA 05/24/23 5:53 PM  Karie Chimera, M.D., Ph.D. Diseases & Surgery of the Retina and Vitreous [facility]  I have reviewed the above documentation for accuracy and completeness, and I agree with the above. Karie Chimera, M.D., Ph.D. 05/24/23 5:53 PM  Abbreviations: M myopia (nearsighted); A astigmatism; H hyperopia (farsighted); P presbyopia; Mrx spectacle prescription;  CTL contact lenses; OD right eye; OS left eye; OU both eyes  XT exotropia; ET esotropia; PEK punctate epithelial keratitis; PEE punctate epithelial erosions; DES dry eye syndrome; MGD meibomian gland dysfunction; ATs artificial tears; PFAT's preservative free artificial tears; NSC nuclear sclerotic cataract; PSC posterior subcapsular cataract; ERM epi-retinal membrane; PVD posterior vitreous detachment; RD retinal detachment; DM diabetes mellitus; DR diabetic retinopathy; NPDR non-proliferative diabetic retinopathy; PDR proliferative diabetic retinopathy; CSME clinically significant macular edema; DME diabetic macular edema; dbh dot blot hemorrhages; CWS cotton wool spot; POAG primary open angle glaucoma; C/D cup-to-disc ratio; HVF humphrey visual field; GVF goldmann visual field; OCT optical coherence tomography; IOP intraocular pressure; BRVO Branch retinal vein occlusion; CRVO central retinal vein occlusion; CRAO central retinal artery occlusion; BRAO branch retinal artery occlusion; RT retinal tear; SB scleral buckle; PPV pars plana vitrectomy; VH Vitreous hemorrhage; PRP panretinal laser photocoagulation; IVK intravitreal kenalog; VMT vitreomacular traction; MH Macular hole;  NVD  neovascularization of the disc; NVE neovascularization elsewhere; AREDS age related eye disease study; ARMD age related macular degeneration; POAG primary open angle glaucoma; EBMD epithelial/anterior basement membrane dystrophy; ACIOL anterior chamber intraocular lens; IOL intraocular lens; PCIOL posterior chamber intraocular lens; Phaco/IOL phacoemulsification with intraocular lens placement; PRK photorefractive keratectomy; LASIK laser assisted in situ keratomileusis; HTN hypertension; DM diabetes mellitus; COPD chronic obstructive pulmonary disease

## 2023-05-23 ENCOUNTER — Ambulatory Visit (INDEPENDENT_AMBULATORY_CARE_PROVIDER_SITE_OTHER): Payer: Medicare HMO | Admitting: Ophthalmology

## 2023-05-23 ENCOUNTER — Encounter (INDEPENDENT_AMBULATORY_CARE_PROVIDER_SITE_OTHER): Payer: Self-pay | Admitting: Ophthalmology

## 2023-05-23 DIAGNOSIS — H35033 Hypertensive retinopathy, bilateral: Secondary | ICD-10-CM

## 2023-05-23 DIAGNOSIS — I10 Essential (primary) hypertension: Secondary | ICD-10-CM

## 2023-05-23 DIAGNOSIS — E113313 Type 2 diabetes mellitus with moderate nonproliferative diabetic retinopathy with macular edema, bilateral: Secondary | ICD-10-CM

## 2023-05-23 DIAGNOSIS — Z7984 Long term (current) use of oral hypoglycemic drugs: Secondary | ICD-10-CM

## 2023-05-23 DIAGNOSIS — H25813 Combined forms of age-related cataract, bilateral: Secondary | ICD-10-CM

## 2023-05-23 MED ORDER — AFLIBERCEPT 2MG/0.05ML IZ SOLN FOR KALEIDOSCOPE
2.0000 mg | INTRAVITREAL | Status: AC | PRN
  Administered 2023-05-23: 2 mg via INTRAVITREAL

## 2023-06-03 ENCOUNTER — Other Ambulatory Visit: Payer: Self-pay | Admitting: Nurse Practitioner

## 2023-06-03 DIAGNOSIS — E785 Hyperlipidemia, unspecified: Secondary | ICD-10-CM

## 2023-06-13 NOTE — Progress Notes (Signed)
[facility] - Clinic Note  06/27/2023     CHIEF COMPLAINT Patient presents for Retina Follow Up   HISTORY OF PRESENT ILLNESS: Arthur Torres is a 72 y.o. male who presents to the clinic today for:   HPI     Retina Follow Up   Patient presents with  Diabetic Retinopathy.  In both eyes.  This started 5 weeks ago.  I, the attending physician,  performed the HPI with the patient and updated documentation appropriately.        Comments   Patient here for 5 weeks retina follow up for NPDR OU. Patient states vision doing ok. No eye pain.       Last edited by Rennis Chris, MD on 06/27/2023  4:28 PM.    Pt states vision seems stable   Referring physician: Bennie Pierini, FNP [address] [place],  [place] [address]  HISTORICAL INFORMATION:   Selected notes from the MEDICAL RECORD NUMBER Referred by Dr. Despina Arias for DM exam LEE:  Ocular Hx- PMH-    CURRENT MEDICATIONS: No current outpatient medications on file. (Ophthalmic Drugs)   No current facility-administered medications for this visit. (Ophthalmic Drugs)   Current Outpatient Medications (Other)  Medication Sig   aspirin 325 MG EC tablet Take 325 mg by mouth daily.   glipiZIDE (GLUCOTROL) 5 MG tablet TAKE 1 TABLET (5 MG TOTAL) BY MOUTH TWICE A DAY BEFORE MEALS   metoprolol succinate (TOPROL-XL) 50 MG 24 hr tablet Take 1 tablet (50 mg total) by mouth daily. Take with or immediately following a meal.   pantoprazole (PROTONIX) 40 MG tablet Take 1 tablet (40 mg total) by mouth daily.   rosuvastatin (CRESTOR) 10 MG tablet TAKE 1 TABLET BY MOUTH EVERY DAY   sitaGLIPtin-metformin (JANUMET) 50-500 MG tablet Take 1 tablet by mouth 2 (two) times daily with a meal.   No current facility-administered medications for this visit. (Other)   REVIEW OF SYSTEMS: ROS   Positive for: Endocrine, Eyes Negative for: Constitutional, Gastrointestinal, Neurological, Skin, Genitourinary, Musculoskeletal,  HENT, Cardiovascular, Respiratory, Psychiatric, Allergic/Imm, Heme/Lymph Last edited by Laddie Aquas, COA on 06/27/2023 12:39 PM.      ALLERGIES Allergies  Allergen Reactions   Penicillins    Zocor [Simvastatin]    PAST MEDICAL HISTORY Past Medical History:  Diagnosis Date   CAD (coronary artery disease)    1999 occluded LAD treated with PCI and stenting, 80% stenosis of the first diagonal feeling angioplasty, 60-70% second stenosis, 50% ostial left main stenosis, 25% right coronary artery stenosis, 50% ostial PDA stenosis.   Cancer (HCC)    prostate (treated with radiation)   COVID-19    Diabetes mellitus without complication (HCC)    Hyperlipidemia    Hypertension    Joint pain    Past Surgical History:  Procedure Laterality Date   CARDIAC CATHETERIZATION     COLON SURGERY     hemrrhoids   CORONARY ANGIOPLASTY WITH STENT PLACEMENT     FAMILY HISTORY Family History  Problem Relation Age of Onset   Diabetes Mother    Stroke Mother    Cancer Father        skin, prostate, stomach   Healthy Daughter    Healthy Son    Asthma Son    SOCIAL HISTORY Social History   Tobacco Use   Smoking status: Former    Current packs/day: 0.00    Types: Cigarettes    Quit date: 03/28/1998    Years since  quitting: 25.2   Smokeless tobacco: Never  Vaping Use   Vaping status: Never Used  Substance Use Topics   Alcohol use: No   Drug use: No       OPHTHALMIC EXAM:  Base Eye Exam     Visual Acuity (Snellen - Linear)       Right Left   Dist cc 20/50 20/20   Dist ph cc 20/40 -2     Correction: Glasses         Tonometry (Tonopen, 12:38 PM)       Right Left   Pressure 12 12         Pupils       Dark Light Shape React APD   Right 3 2 Round Brisk None   Left 3 2 Round Brisk None         Visual Fields (Counting fingers)       Left Right    Full Full         Extraocular Movement       Right Left    Full, Ortho Full, Ortho          Neuro/Psych     Oriented x3: Yes   Mood/Affect: Normal         Dilation     Both eyes: 1.0% Mydriacyl, 2.5% Phenylephrine @ 12:37 PM           Slit Lamp and Fundus Exam     Slit Lamp Exam       Right Left   Lids/Lashes Dermatochalasis - upper lid, Meibomian gland dysfunction Dermatochalasis - upper lid, Meibomian gland dysfunction   Conjunctiva/Sclera White and quiet nasal and temporal pinguecula   Cornea arcus Trace tear film debris   Anterior Chamber deep and clear deep and clear   Iris Round and dilated, No NVI Round and dilated, No NVI   Lens 2+ Nuclear sclerosis with brunescence, 2+ Cortical cataract 2+ Nuclear sclerosis with brunescence, 2+ Cortical cataract   Anterior Vitreous mild syneresis mild syneresis         Fundus Exam       Right Left   Disc Pink and Sharp, +PPP Pink and Sharp, mild PPP   C/D Ratio 0.2 0.2   Macula Blunted foveal reflex, central edema -- slightly improved, scattered MA/DBH greatest centrally -- slightly improved Flat, Blunted foveal reflex, scattered MA, trace cystic changes   Vessels attenuated, Tortuous attenuated, Tortuous   Periphery Attached, 360 MA / DBH -- improved Attached, rare MA           Refraction     Wearing Rx       Sphere Cylinder Axis Add   Right -1.50 +0.75 114 +2.50   Left -1.50 +0.25 163 +2.50            IMAGING AND PROCEDURES  Imaging and Procedures for 06/27/2023  OCT, Retina - OU - Both Eyes       Right Eye Quality was good. Central Foveal Thickness: 347. Progression has improved. Findings include no SRF, abnormal foveal contour, intraretinal hyper-reflective material, intraretinal fluid, vitreomacular adhesion (Persistent central IRF/IRHM -- slightly improved).   Left Eye Quality was good. Central Foveal Thickness: 309. Progression has been stable. Findings include normal foveal contour, no SRF, intraretinal fluid (Focal cystic changes ST mac -- persistent).   Notes *Images captured and  stored on drive  Diagnosis / Impression:  OD: +DME/CME - Persistent central IRF/IRHM -- slightly improved OS: Focal cystic changes ST mac -- persistent  Clinical management:  See below  Abbreviations: NFP - Normal foveal profile. CME - cystoid macular edema. PED - pigment epithelial detachment. IRF - intraretinal fluid. SRF - subretinal fluid. EZ - ellipsoid zone. ERM - epiretinal membrane. ORA - outer retinal atrophy. ORT - outer retinal tubulation. SRHM - subretinal hyper-reflective material. IRHM - intraretinal hyper-reflective material      Intravitreal Injection, Pharmacologic Agent - OD - Right Eye       Time Out 06/27/2023. 1:15 PM. Confirmed correct patient, procedure, site, and patient consented.   Anesthesia Topical anesthesia was used. Anesthetic medications included Lidocaine 2%, Proparacaine 0.5%.   Procedure Preparation included 5% betadine to ocular surface, eyelid speculum. A (32g) needle was used.   Injection: 2 mg aflibercept 2 MG/0.05ML   Route: Intravitreal, Site: Right Eye   NDC: L6038910, Lot: [id number], Expiration date: 10/25/2024, Waste: 0 mL   Post-op Post injection exam found visual acuity of at least counting fingers. The patient tolerated the procedure well. There were no complications. The patient received written and verbal post procedure care education. Post injection medications were not given.            ASSESSMENT/PLAN:    ICD-10-CM   1. Moderate nonproliferative diabetic retinopathy of both eyes with macular edema associated with type 2 diabetes mellitus (HCC)  E11.3313 OCT, Retina - OU - Both Eyes    Intravitreal Injection, Pharmacologic Agent - OD - Right Eye    aflibercept (EYLEA) SOLN 2 mg    2. Long term (current) use of oral hypoglycemic drugs  Z79.84     3. Essential hypertension  I10     4. Hypertensive retinopathy of both eyes  H35.033     5. Combined forms of age-related cataract of both eyes  H25.813      1,2.  Moderate Non-proliferative diabetic retinopathy, both eyes (OD>OS)  - A1c 5.9 (07.05.24) - exam shows scattered MA OU (OD > OS) - s/p IVA OD #1 (04.08.24), #2 (05.06.24), #3 (06.03.24), #4 (07.01.24), #5 (07.29.24), #6 (08.26.24)--IVA Resistance - s/p IVE OD #1 (09.27.24), #2 (10.28.24), #3 (11.25.24) - FA (04.08.24) shows OD: scattered leaking MA greatest centrally; no NV; Large area of vascular non-perfusion temporal periphery with +perivascular leakage; OS: Scattered MA with late leakage, no NV - BCVA OD stable at 20/40, OS 20/20 -- stable - OCT OD shows OD: Persistent central IRF/IRHM -- slightly improved; OS: Focal cystic changes ST mac -- persistent at 5 weeks - recommend IVE OD #4 today, 12.30.24 for DME w/ f/u in 4-5 wks - pt wishes to proceed - RBA of procedure discussed, questions answered - IVE informed consent obtained and signed, 09.30.24 (OD) - see procedure note - Eylea approved for 2024 - f/u in 4-5 wks -- DFE/OCT, possible injection  3,4. Hypertensive retinopathy OU - discussed importance of tight BP control - possible RVO/CME component to macular edema OD - monitor  5. Mixed Cataract OU - The symptoms of cataract, surgical options, and treatments and risks were discussed with patient. - discussed diagnosis and progression - referred to [facility] [place] for consult -- pt is deferring consult for now  Ophthalmic Meds Ordered this visit:  Meds ordered this encounter  Medications   aflibercept (EYLEA) SOLN 2 mg     Return for f/u 4-5 weeks, NPDR OU, DFE, OCT.  There are no Patient Instructions on file for this visit.  Explained the diagnoses, plan, and follow up with the patient and they expressed understanding.  Patient expressed  understanding of the importance of proper follow up care.   This document serves as a record of services personally performed by Karie Chimera, MD, PhD. It was created on their behalf by Glee Arvin. Manson Passey, OA an ophthalmic  technician. The creation of this record is the provider's dictation and/or activities during the visit.    Electronically signed by: Glee Arvin. Manson Passey, OA 06/27/23 4:29 PM  Karie Chimera, M.D., Ph.D. Diseases & Surgery of the Retina and Vitreous [facility]  I have reviewed the above documentation for accuracy and completeness, and I agree with the above. Karie Chimera, M.D., Ph.D. 06/27/23 4:30 PM  Abbreviations: M myopia (nearsighted); A astigmatism; H hyperopia (farsighted); P presbyopia; Mrx spectacle prescription;  CTL contact lenses; OD right eye; OS left eye; OU both eyes  XT exotropia; ET esotropia; PEK punctate epithelial keratitis; PEE punctate epithelial erosions; DES dry eye syndrome; MGD meibomian gland dysfunction; ATs artificial tears; PFAT's preservative free artificial tears; NSC nuclear sclerotic cataract; PSC posterior subcapsular cataract; ERM epi-retinal membrane; PVD posterior vitreous detachment; RD retinal detachment; DM diabetes mellitus; DR diabetic retinopathy; NPDR non-proliferative diabetic retinopathy; PDR proliferative diabetic retinopathy; CSME clinically significant macular edema; DME diabetic macular edema; dbh dot blot hemorrhages; CWS cotton wool spot; POAG primary open angle glaucoma; C/D cup-to-disc ratio; HVF humphrey visual field; GVF goldmann visual field; OCT optical coherence tomography; IOP intraocular pressure; BRVO Branch retinal vein occlusion; CRVO central retinal vein occlusion; CRAO central retinal artery occlusion; BRAO branch retinal artery occlusion; RT retinal tear; SB scleral buckle; PPV pars plana vitrectomy; VH Vitreous hemorrhage; PRP panretinal laser photocoagulation; IVK intravitreal kenalog; VMT vitreomacular traction; MH Macular hole;  NVD neovascularization of the disc; NVE neovascularization elsewhere; AREDS age related eye disease study; ARMD age related macular degeneration; POAG primary open angle glaucoma; EBMD  epithelial/anterior basement membrane dystrophy; ACIOL anterior chamber intraocular lens; IOL intraocular lens; PCIOL posterior chamber intraocular lens; Phaco/IOL phacoemulsification with intraocular lens placement; PRK photorefractive keratectomy; LASIK laser assisted in situ keratomileusis; HTN hypertension; DM diabetes mellitus; COPD chronic obstructive pulmonary disease

## 2023-06-27 ENCOUNTER — Encounter (INDEPENDENT_AMBULATORY_CARE_PROVIDER_SITE_OTHER): Payer: Self-pay | Admitting: Ophthalmology

## 2023-06-27 ENCOUNTER — Ambulatory Visit (INDEPENDENT_AMBULATORY_CARE_PROVIDER_SITE_OTHER): Payer: Medicare HMO | Admitting: Ophthalmology

## 2023-06-27 DIAGNOSIS — I10 Essential (primary) hypertension: Secondary | ICD-10-CM

## 2023-06-27 DIAGNOSIS — H25813 Combined forms of age-related cataract, bilateral: Secondary | ICD-10-CM

## 2023-06-27 DIAGNOSIS — E113313 Type 2 diabetes mellitus with moderate nonproliferative diabetic retinopathy with macular edema, bilateral: Secondary | ICD-10-CM

## 2023-06-27 DIAGNOSIS — Z7984 Long term (current) use of oral hypoglycemic drugs: Secondary | ICD-10-CM | POA: Diagnosis not present

## 2023-06-27 DIAGNOSIS — H35033 Hypertensive retinopathy, bilateral: Secondary | ICD-10-CM

## 2023-06-27 MED ORDER — AFLIBERCEPT 2MG/0.05ML IZ SOLN FOR KALEIDOSCOPE
2.0000 mg | INTRAVITREAL | Status: AC | PRN
  Administered 2023-06-27: 2 mg via INTRAVITREAL

## 2023-07-12 ENCOUNTER — Ambulatory Visit (INDEPENDENT_AMBULATORY_CARE_PROVIDER_SITE_OTHER): Payer: Medicare HMO | Admitting: Nurse Practitioner

## 2023-07-12 ENCOUNTER — Encounter: Payer: Self-pay | Admitting: Nurse Practitioner

## 2023-07-12 VITALS — BP 141/74 | HR 66 | Temp 97.3°F | Ht 70.0 in | Wt 177.0 lb

## 2023-07-12 DIAGNOSIS — K219 Gastro-esophageal reflux disease without esophagitis: Secondary | ICD-10-CM | POA: Diagnosis not present

## 2023-07-12 DIAGNOSIS — E1169 Type 2 diabetes mellitus with other specified complication: Secondary | ICD-10-CM

## 2023-07-12 DIAGNOSIS — E785 Hyperlipidemia, unspecified: Secondary | ICD-10-CM | POA: Diagnosis not present

## 2023-07-12 DIAGNOSIS — Z8546 Personal history of malignant neoplasm of prostate: Secondary | ICD-10-CM

## 2023-07-12 DIAGNOSIS — Z7984 Long term (current) use of oral hypoglycemic drugs: Secondary | ICD-10-CM | POA: Diagnosis not present

## 2023-07-12 DIAGNOSIS — E119 Type 2 diabetes mellitus without complications: Secondary | ICD-10-CM | POA: Diagnosis not present

## 2023-07-12 DIAGNOSIS — Z6827 Body mass index (BMI) 27.0-27.9, adult: Secondary | ICD-10-CM | POA: Diagnosis not present

## 2023-07-12 DIAGNOSIS — I10 Essential (primary) hypertension: Secondary | ICD-10-CM

## 2023-07-12 LAB — LIPID PANEL

## 2023-07-12 LAB — BAYER DCA HB A1C WAIVED: HB A1C (BAYER DCA - WAIVED): 5.6 % (ref 4.8–5.6)

## 2023-07-12 MED ORDER — PANTOPRAZOLE SODIUM 40 MG PO TBEC: 40.0000 mg | DELAYED_RELEASE_TABLET | Freq: Every day | ORAL | 1 refills | Status: DC

## 2023-07-12 MED ORDER — JANUMET 50-500 MG PO TABS: 1.0000 | ORAL_TABLET | Freq: Two times a day (BID) | ORAL | 1 refills | Status: DC

## 2023-07-12 MED ORDER — METOPROLOL SUCCINATE ER 50 MG PO TB24: 50.0000 mg | ORAL_TABLET | Freq: Every day | ORAL | 1 refills | Status: DC

## 2023-07-12 MED ORDER — ROSUVASTATIN CALCIUM 10 MG PO TABS: 10.0000 mg | ORAL_TABLET | Freq: Every day | ORAL | 1 refills | Status: DC

## 2023-07-12 MED ORDER — GLIPIZIDE 5 MG PO TABS: ORAL_TABLET | ORAL | 1 refills | Status: DC

## 2023-07-12 NOTE — Patient Instructions (Signed)

## 2023-07-12 NOTE — Progress Notes (Signed)
 Subjective:    Patient ID: Arthur Torres, male    DOB: 20-Aug-1950, 73 y.o.   MRN: [id number]   Chief Complaint: medical management of chronic issues     HPI:  Arthur Torres is a 73 y.o. who identifies as a male who was assigned male at birth.   Social history: Lives with: wife Work history: repairs motors   Comes in today for follow up of the following chronic medical issues:  1. Essential hypertension, benign No c/o chest pain, sob or headache. Does not check blood pressure at home. BP Readings from Last 3 Encounters:  04/01/23 130/74  12/31/22 114/63  12/13/22 109/65     2. Hyperlipidemia associated with type 2 diabetes mellitus (HCC) Tries to watch diet but does no dedicated exercise. Lab Results  Component Value Date   CHOL 135 04/01/2023   HDL 45 04/01/2023   LDLCALC 75 04/01/2023   TRIG 78 04/01/2023   CHOLHDL 3.0 04/01/2023     3. Diabetes mellitus treated with oral medication (HCC) He does not check his blood sugars at home. We halfed his glipizide  at last visit. Ran out of janumet  so just used his metformin  for about 1 month. Lab Results  Component Value Date   HGBA1C 5.5 04/01/2023     4. Gastroesophageal reflux disease without esophagitis Is on protonix  and is doing well.  5. BMI 27.0-27.9,adult No recent weight changes  Wt Readings from Last 3 Encounters:  07/12/23 177 lb (80.3 kg)  04/01/23 176 lb (79.8 kg)  01/26/23 179 lb (81.2 kg)   BMI Readings from Last 3 Encounters:  07/12/23 25.40 kg/m  04/01/23 25.25 kg/m  01/26/23 25.68 kg/m        New complaints: None today  Allergies  Allergen Reactions   Penicillins    Zocor [Simvastatin]    Outpatient Encounter Medications as of 07/12/2023  Medication Sig   aspirin 325 MG EC tablet Take 325 mg by mouth daily.   glipiZIDE  (GLUCOTROL ) 5 MG tablet TAKE 1 TABLET (5 MG TOTAL) BY MOUTH TWICE A DAY BEFORE MEALS   metoprolol  succinate (TOPROL -XL) 50 MG 24 hr tablet Take 1 tablet  (50 mg total) by mouth daily. Take with or immediately following a meal.   pantoprazole  (PROTONIX ) 40 MG tablet Take 1 tablet (40 mg total) by mouth daily.   rosuvastatin  (CRESTOR ) 10 MG tablet TAKE 1 TABLET BY MOUTH EVERY DAY   sitaGLIPtin -metformin  (JANUMET ) 50-500 MG tablet Take 1 tablet by mouth 2 (two) times daily with a meal.   No facility-administered encounter medications on file as of 07/12/2023.    Past Surgical History:  Procedure Laterality Date   CARDIAC CATHETERIZATION     COLON SURGERY     hemrrhoids   CORONARY ANGIOPLASTY WITH STENT PLACEMENT      Family History  Problem Relation Age of Onset   Diabetes Mother    Stroke Mother    Cancer Father        skin, prostate, stomach   Healthy Daughter    Healthy Son    Asthma Son       Controlled substance contract: n/a     Review of Systems  Constitutional:  Negative for diaphoresis.  Eyes:  Negative for pain.  Respiratory:  Negative for shortness of breath.   Cardiovascular:  Negative for chest pain, palpitations and leg swelling.  Gastrointestinal:  Negative for abdominal pain.  Endocrine: Negative for polydipsia.  Skin:  Negative for rash.  Neurological:  Negative for  dizziness, weakness and headaches.  Hematological:  Does not bruise/bleed easily.  All other systems reviewed and are negative.      Objective:   Physical Exam Vitals and nursing note reviewed.  Constitutional:      Appearance: Normal appearance. He is well-developed.  HENT:     Head: Normocephalic.     Nose: Nose normal.     Mouth/Throat:     Mouth: Mucous membranes are moist.     Pharynx: Oropharynx is clear.  Eyes:     Pupils: Pupils are equal, round, and reactive to light.  Neck:     Thyroid : No thyroid  mass or thyromegaly.     Vascular: No carotid bruit or JVD.     Trachea: Phonation normal.  Cardiovascular:     Rate and Rhythm: Normal rate and regular rhythm.  Pulmonary:     Effort: Pulmonary effort is normal. No  respiratory distress.     Breath sounds: Normal breath sounds.  Abdominal:     General: Bowel sounds are normal.     Palpations: Abdomen is soft.     Tenderness: There is no abdominal tenderness.  Musculoskeletal:        General: Normal range of motion.     Cervical back: Normal range of motion and neck supple.  Lymphadenopathy:     Cervical: No cervical adenopathy.  Skin:    General: Skin is warm and dry.  Neurological:     Mental Status: He is alert and oriented to person, place, and time.  Psychiatric:        Behavior: Behavior normal.        Thought Content: Thought content normal.        Judgment: Judgment normal.     There were no vitals taken for this visit.   Hgba1c 5.6%     Assessment & Plan:   Arthur Torres comes in today with chief complaint of No chief complaint on file.   Diagnosis and orders addressed:  1. Essential hypertension, benign Low sodium diet - CBC with Differential/Platelet - CMP14+EGFR  2. Hyperlipidemia associated with type 2 diabetes mellitus (HCC) Low fat diet - Lipid panel  3. Diabetes mellitus treated with oral medication (HCC) Continue to watch carbs in diet - Bayer DCA Hb A1c Waived  4. Gastroesophageal reflux disease without esophagitis Avoid spicy foods Do not eat 2 hours prior to bedtime   5. BMI 27.0-27.9,adult Discussed diet and exercise for person with BMI >25 Will recheck weight in 3-6 months    Labs pending Health Maintenance reviewed Diet and exercise encouraged  Follow up plan: 3 months   Mary-Margaret Gladis, FNP

## 2023-07-13 LAB — CMP14+EGFR
ALT: 22 IU/L (ref 0–44)
AST: 19 IU/L (ref 0–40)
Albumin: 4.8 g/dL (ref 3.9–4.9)
Alkaline Phosphatase: 70 IU/L (ref 44–121)
BUN/Creatinine Ratio: 21 (ref 10–24)
BUN: 23 mg/dL (ref 8–27)
Bilirubin Total: 0.5 mg/dL (ref 0.0–1.2)
CO2: 24 mmol/L (ref 20–29)
Calcium: 10.1 mg/dL (ref 8.6–10.2)
Chloride: 99 mmol/L (ref 96–106)
Creatinine, Ser: 1.1 mg/dL (ref 0.76–1.27)
Globulin, Total: 2.3 g/dL (ref 1.5–4.5)
Glucose: 150 mg/dL — ABNORMAL HIGH (ref 70–99)
Potassium: 4.6 mmol/L (ref 3.5–5.2)
Sodium: 140 mmol/L (ref 134–144)
Total Protein: 7.1 g/dL (ref 6.0–8.5)
eGFR: 72 mL/min/{1.73_m2} (ref >59)

## 2023-07-13 LAB — CBC WITH DIFFERENTIAL/PLATELET
Basophils Absolute: 0.1 10*3/uL (ref 0.0–0.2)
Basos: 2 %
EOS (ABSOLUTE): 0.2 10*3/uL (ref 0.0–0.4)
Eos: 3 %
Hematocrit: 47.7 % (ref 37.5–51.0)
Hemoglobin: 15.6 g/dL (ref 13.0–17.7)
Immature Grans (Abs): 0 10*3/uL (ref 0.0–0.1)
Immature Granulocytes: 0 %
Lymphocytes Absolute: 1.6 10*3/uL (ref 0.7–3.1)
Lymphs: 33 %
MCH: 28.5 pg (ref 26.6–33.0)
MCHC: 32.7 g/dL (ref 31.5–35.7)
MCV: 87 fL (ref 79–97)
Monocytes Absolute: 0.3 10*3/uL (ref 0.1–0.9)
Monocytes: 7 %
Neutrophils Absolute: 2.7 10*3/uL (ref 1.4–7.0)
Neutrophils: 55 %
Platelets: 168 10*3/uL (ref 150–450)
RBC: 5.48 x10E6/uL (ref 4.14–5.80)
RDW: 13.2 % (ref 11.6–15.4)
WBC: 4.8 10*3/uL (ref 3.4–10.8)

## 2023-07-13 LAB — LIPID PANEL
Cholesterol, Total: 138 mg/dL (ref 100–199)
HDL: 46 mg/dL (ref >39)
LDL CALC COMMENT:: 3 ratio (ref 0.0–5.0)
LDL Chol Calc (NIH): 74 mg/dL (ref 0–99)
Triglycerides: 96 mg/dL (ref 0–149)
VLDL Cholesterol Cal: 18 mg/dL (ref 5–40)

## 2023-07-13 LAB — MICROALBUMIN / CREATININE URINE RATIO
Creatinine, Urine: 66.1 mg/dL
Microalb/Creat Ratio: 13 mg/g{creat} (ref 0–29)
Microalbumin, Urine: 8.7 ug/mL

## 2023-07-14 NOTE — Telephone Encounter (Signed)
Copied from CRM [id number]. Topic: Clinical - Prescription Issue >> Jul 14, 2023 12:48 PM Carlatta H wrote: Reason for CRM: Patient called stating that the medication is to high (sitaGLIPtin-metformin (JANUMET) 50-500 MG tablet [[id number]])//He is looking for a cheaper substitute//

## 2023-07-18 NOTE — Progress Notes (Signed)
[facility] - Clinic Note  07/25/2023     CHIEF COMPLAINT Patient presents for Retina Follow Up   HISTORY OF PRESENT ILLNESS: Arthur Torres is a 73 y.o. male who presents to the clinic today for:   HPI     Retina Follow Up   Patient presents with  Diabetic Retinopathy.  In both eyes.  This started 4 weeks ago.  I, the attending physician,  performed the HPI with the patient and updated documentation appropriately.        Comments   Patient here for 4 weeks retina follow up for NPDR OU. Patient states vision doing alright. About the same. No eye pain.       Last edited by Rennis Chris, MD on 07/25/2023  1:10 PM.     Pt states    Referring physician: Bennie Pierini, FNP [address] [place],  [place] [address]  HISTORICAL INFORMATION:   Selected notes from the MEDICAL RECORD NUMBER Referred by Dr. Despina Arias for DM exam LEE:  Ocular Hx- PMH-    CURRENT MEDICATIONS: No current outpatient medications on file. (Ophthalmic Drugs)   No current facility-administered medications for this visit. (Ophthalmic Drugs)   Current Outpatient Medications (Other)  Medication Sig   aspirin 325 MG EC tablet Take 325 mg by mouth daily.   glipiZIDE (GLUCOTROL) 5 MG tablet TAKE 1 TABLET (5 MG TOTAL) BY MOUTH TWICE A DAY BEFORE MEALS   metoprolol succinate (TOPROL-XL) 50 MG 24 hr tablet Take 1 tablet (50 mg total) by mouth daily. Take with or immediately following a meal.   pantoprazole (PROTONIX) 40 MG tablet Take 1 tablet (40 mg total) by mouth daily.   rosuvastatin (CRESTOR) 10 MG tablet Take 1 tablet (10 mg total) by mouth daily.   sitaGLIPtin-metformin (JANUMET) 50-500 MG tablet Take 1 tablet by mouth 2 (two) times daily with a meal.   No current facility-administered medications for this visit. (Other)   REVIEW OF SYSTEMS: ROS   Positive for: Endocrine, Eyes Negative for: Constitutional, Gastrointestinal, Neurological, Skin, Genitourinary,  Musculoskeletal, HENT, Cardiovascular, Respiratory, Psychiatric, Allergic/Imm, Heme/Lymph Last edited by Laddie Aquas, COA on 07/25/2023 12:29 PM.       ALLERGIES Allergies  Allergen Reactions   Penicillins    Zocor [Simvastatin]    PAST MEDICAL HISTORY Past Medical History:  Diagnosis Date   CAD (coronary artery disease)    1999 occluded LAD treated with PCI and stenting, 80% stenosis of the first diagonal feeling angioplasty, 60-70% second stenosis, 50% ostial left main stenosis, 25% right coronary artery stenosis, 50% ostial PDA stenosis.   Cancer (HCC)    prostate (treated with radiation)   COVID-19    Diabetes mellitus without complication (HCC)    Hyperlipidemia    Hypertension    Joint pain    Past Surgical History:  Procedure Laterality Date   CARDIAC CATHETERIZATION     COLON SURGERY     hemrrhoids   CORONARY ANGIOPLASTY WITH STENT PLACEMENT     FAMILY HISTORY Family History  Problem Relation Age of Onset   Diabetes Mother    Stroke Mother    Cancer Father        skin, prostate, stomach   Healthy Daughter    Healthy Son    Asthma Son    SOCIAL HISTORY Social History   Tobacco Use   Smoking status: Former    Current packs/day: 0.00    Types: Cigarettes    Quit date: 03/28/1998  Years since quitting: 25.3   Smokeless tobacco: Never  Vaping Use   Vaping status: Never Used  Substance Use Topics   Alcohol use: No   Drug use: No       OPHTHALMIC EXAM:  Base Eye Exam     Visual Acuity (Snellen - Linear)       Right Left   Dist cc 20/50 -1 20/20   Dist ph cc 20/40     Correction: Glasses         Tonometry (Tonopen, 12:27 PM)       Right Left   Pressure 14 15         Pupils       Dark Light Shape React APD   Right 3 2 Round Brisk None   Left 3 2 Round Brisk None         Visual Fields (Counting fingers)       Left Right    Full Full         Extraocular Movement       Right Left    Full, Ortho Full, Ortho          Neuro/Psych     Oriented x3: Yes   Mood/Affect: Normal         Dilation     Both eyes: 1.0% Mydriacyl, 2.5% Phenylephrine @ 12:27 PM           Slit Lamp and Fundus Exam     Slit Lamp Exam       Right Left   Lids/Lashes Dermatochalasis - upper lid, Meibomian gland dysfunction Dermatochalasis - upper lid, Meibomian gland dysfunction   Conjunctiva/Sclera White and quiet nasal and temporal pinguecula   Cornea arcus Trace tear film debris   Anterior Chamber deep and clear deep and clear   Iris Round and dilated, No NVI Round and dilated, No NVI   Lens 2+ Nuclear sclerosis with brunescence, 2+ Cortical cataract 2+ Nuclear sclerosis with brunescence, 2+ Cortical cataract   Anterior Vitreous mild syneresis mild syneresis         Fundus Exam       Right Left   Disc Pink and Sharp, +PPP Pink and Sharp, mild PPP   C/D Ratio 0.2 0.2   Macula Blunted foveal reflex, central edema / cystic changes-- slightly improved, scattered MA/DBH greatest centrally -- slightly improved Flat, Blunted foveal reflex, scattered MA, trace cystic changes -- improved   Vessels attenuated, Tortuous attenuated, Tortuous   Periphery Attached, 360 MA / DBH -- improved Attached, rare MA           Refraction     Wearing Rx       Sphere Cylinder Axis Add   Right -1.50 +0.75 114 +2.50   Left -1.50 +0.25 163 +2.50            IMAGING AND PROCEDURES  Imaging and Procedures for 07/25/2023  OCT, Retina - OU - Both Eyes       Right Eye Quality was good. Central Foveal Thickness: 341. Progression has improved. Findings include no SRF, abnormal foveal contour, intraretinal hyper-reflective material, intraretinal fluid, vitreomacular adhesion (Persistent central IRF/IRHM -- slightly improved).   Left Eye Quality was good. Central Foveal Thickness: 311. Progression has improved. Findings include normal foveal contour, no SRF, intraretinal fluid (Persistent focal cystic changes ST mac --  slightly improved).   Notes *Images captured and stored on drive  Diagnosis / Impression:  OD: +DME/CME - Persistent central IRF/IRHM -- slightly improved OS:  Persistent focal cystic changes ST mac -- slightly improved  Clinical management:  See below  Abbreviations: NFP - Normal foveal profile. CME - cystoid macular edema. PED - pigment epithelial detachment. IRF - intraretinal fluid. SRF - subretinal fluid. EZ - ellipsoid zone. ERM - epiretinal membrane. ORA - outer retinal atrophy. ORT - outer retinal tubulation. SRHM - subretinal hyper-reflective material. IRHM - intraretinal hyper-reflective material      Intravitreal Injection, Pharmacologic Agent - OD - Right Eye       Time Out 07/25/2023. 12:48 PM. Confirmed correct patient, procedure, site, and patient consented.   Anesthesia Topical anesthesia was used. Anesthetic medications included Lidocaine 2%, Proparacaine 0.5%.   Procedure Preparation included 5% betadine to ocular surface, eyelid speculum. A supplied (32g) needle was used.   Injection: 1.25 mg Bevacizumab 1.25mg /0.6ml   Route: Intravitreal, Site: Right Eye   NDC: 78295-621-30, Lot: [id number] , Expiration date: 08/11/2023   Post-op Post injection exam found visual acuity of at least counting fingers. The patient tolerated the procedure well. There were no complications. The patient received written and verbal post procedure care education. Post injection medications were not given.             ASSESSMENT/PLAN:    ICD-10-CM   1. Moderate nonproliferative diabetic retinopathy of both eyes with macular edema associated with type 2 diabetes mellitus (HCC)  E11.3313 OCT, Retina - OU - Both Eyes    Intravitreal Injection, Pharmacologic Agent - OD - Right Eye    Bevacizumab (AVASTIN) SOLN 1.25 mg    2. Long term (current) use of oral hypoglycemic drugs  Z79.84     3. Essential hypertension  I10     4. Hypertensive retinopathy of both eyes  H35.033      5. Combined forms of age-related cataract of both eyes  H25.813      1,2. Moderate Non-proliferative diabetic retinopathy, both eyes (OD>OS)  - A1c 5.6 (01.14.25) - exam shows scattered MA OU (OD > OS) - s/p IVA OD #1 (04.08.24), #2 (05.06.24), #3 (06.03.24), #4 (07.01.24), #5 (07.29.24), #6 (08.26.24) - s/p IVE OD #1 (09.27.24), #2 (10.28.24), #3 (11.25.24), #4 (12.30.24) - FA (04.08.24) shows OD: scattered leaking MA greatest centrally; no NV; Large area of vascular non-perfusion temporal periphery with +perivascular leakage; OS: Scattered MA with late leakage, no NV - BCVA OD stable at 20/40, OS 20/20 -- stable - OCT OD shows OD: +DME/CME - Persistent central IRF/IRHM -- slightly improved; OS: Persistent focal cystic changes ST mac -- slightly improved at 4 weeks - recommend switching back to IVA OD #5 (due to no funding for Good Days) today, 01.27.25 for DME w/ f/u in 4 wks - pt wishes to proceed - RBA of procedure discussed, questions answered - IVA informed consent obtained and signed, 01.27.25 (OD) - see procedure note - Eylea approved for 2025 -- but no funding for Good Days - f/u in 4 wks -- DFE/OCT, possible injection  3,4. Hypertensive retinopathy OU - discussed importance of tight BP control - possible RVO/CME component to macular edema OD - monitor  5. Mixed Cataract OU - The symptoms of cataract, surgical options, and treatments and risks were discussed with patient. - discussed diagnosis and progression - referred to [facility] -- [place] for consult -- pt is deferring consult for now  Ophthalmic Meds Ordered this visit:  Meds ordered this encounter  Medications   Bevacizumab (AVASTIN) SOLN 1.25 mg     Return in about 4 weeks (around 08/22/2023) for  f/u NPDR OU, DFE, OCT, Possible Injxn.  There are no Patient Instructions on file for this visit.  Explained the diagnoses, plan, and follow up with the patient and they expressed understanding.  Patient  expressed understanding of the importance of proper follow up care.   This document serves as a record of services personally performed by Karie Chimera, MD, PhD. It was created on their behalf by Glee Arvin. Manson Passey, OA an ophthalmic technician. The creation of this record is the provider's dictation and/or activities during the visit.    Electronically signed by: Glee Arvin. Manson Passey, OA 07/25/23 1:11 PM   Karie Chimera, M.D., Ph.D. Diseases & Surgery of the Retina and Vitreous [facility]  I have reviewed the above documentation for accuracy and completeness, and I agree with the above. Karie Chimera, M.D., Ph.D. 07/25/23 1:11 PM   Abbreviations: M myopia (nearsighted); A astigmatism; H hyperopia (farsighted); P presbyopia; Mrx spectacle prescription;  CTL contact lenses; OD right eye; OS left eye; OU both eyes  XT exotropia; ET esotropia; PEK punctate epithelial keratitis; PEE punctate epithelial erosions; DES dry eye syndrome; MGD meibomian gland dysfunction; ATs artificial tears; PFAT's preservative free artificial tears; NSC nuclear sclerotic cataract; PSC posterior subcapsular cataract; ERM epi-retinal membrane; PVD posterior vitreous detachment; RD retinal detachment; DM diabetes mellitus; DR diabetic retinopathy; NPDR non-proliferative diabetic retinopathy; PDR proliferative diabetic retinopathy; CSME clinically significant macular edema; DME diabetic macular edema; dbh dot blot hemorrhages; CWS cotton wool spot; POAG primary open angle glaucoma; C/D cup-to-disc ratio; HVF humphrey visual field; GVF goldmann visual field; OCT optical coherence tomography; IOP intraocular pressure; BRVO Branch retinal vein occlusion; CRVO central retinal vein occlusion; CRAO central retinal artery occlusion; BRAO branch retinal artery occlusion; RT retinal tear; SB scleral buckle; PPV pars plana vitrectomy; VH Vitreous hemorrhage; PRP panretinal laser photocoagulation; IVK intravitreal  kenalog; VMT vitreomacular traction; MH Macular hole;  NVD neovascularization of the disc; NVE neovascularization elsewhere; AREDS age related eye disease study; ARMD age related macular degeneration; POAG primary open angle glaucoma; EBMD epithelial/anterior basement membrane dystrophy; ACIOL anterior chamber intraocular lens; IOL intraocular lens; PCIOL posterior chamber intraocular lens; Phaco/IOL phacoemulsification with intraocular lens placement; PRK photorefractive keratectomy; LASIK laser assisted in situ keratomileusis; HTN hypertension; DM diabetes mellitus; COPD chronic obstructive pulmonary disease

## 2023-07-25 ENCOUNTER — Ambulatory Visit (INDEPENDENT_AMBULATORY_CARE_PROVIDER_SITE_OTHER): Payer: Medicare HMO | Admitting: Ophthalmology

## 2023-07-25 ENCOUNTER — Encounter (INDEPENDENT_AMBULATORY_CARE_PROVIDER_SITE_OTHER): Payer: Self-pay | Admitting: Ophthalmology

## 2023-07-25 DIAGNOSIS — I10 Essential (primary) hypertension: Secondary | ICD-10-CM

## 2023-07-25 DIAGNOSIS — Z7984 Long term (current) use of oral hypoglycemic drugs: Secondary | ICD-10-CM

## 2023-07-25 DIAGNOSIS — E113313 Type 2 diabetes mellitus with moderate nonproliferative diabetic retinopathy with macular edema, bilateral: Secondary | ICD-10-CM | POA: Diagnosis not present

## 2023-07-25 DIAGNOSIS — H25813 Combined forms of age-related cataract, bilateral: Secondary | ICD-10-CM | POA: Diagnosis not present

## 2023-07-25 DIAGNOSIS — H35033 Hypertensive retinopathy, bilateral: Secondary | ICD-10-CM

## 2023-07-25 MED ORDER — BEVACIZUMAB CHEMO INJECTION 1.25MG/0.05ML SYRINGE FOR KALEIDOSCOPE
1.2500 mg | INTRAVITREAL | Status: AC | PRN
  Administered 2023-07-25: 1.25 mg via INTRAVITREAL

## 2023-08-18 NOTE — Progress Notes (Signed)
 [facility] - Clinic Note  08/22/2023     CHIEF COMPLAINT Patient presents for Retina Follow Up   HISTORY OF PRESENT ILLNESS: Arthur Torres is a 73 y.o. male who presents to the clinic today for:   HPI     Retina Follow Up   Patient presents with  Diabetic Retinopathy.  In both eyes.  This started 4 weeks ago.  Duration of 4 weeks.  Since onset it is stable.  I, the attending physician,  performed the HPI with the patient and updated documentation appropriately.        Comments   4 week retina follow up NPDR ou and IVA OD pt is reporting no vision changes noticed he denies any flashes or floaters he is not currently checking his blood sugar at this time last A1C 5.6       Last edited by Rennis Chris, MD on 08/22/2023  2:44 PM.    Pt feels like vision has improved   Referring physician: Bennie Pierini, FNP [address] [place],  [place] [address]  HISTORICAL INFORMATION:   Selected notes from the MEDICAL RECORD NUMBER Referred by Dr. Despina Arias for DM exam LEE:  Ocular Hx- PMH-    CURRENT MEDICATIONS: No current outpatient medications on file. (Ophthalmic Drugs)   No current facility-administered medications for this visit. (Ophthalmic Drugs)   Current Outpatient Medications (Other)  Medication Sig   aspirin 325 MG EC tablet Take 325 mg by mouth daily.   glipiZIDE (GLUCOTROL) 5 MG tablet TAKE 1 TABLET (5 MG TOTAL) BY MOUTH TWICE A DAY BEFORE MEALS   metoprolol succinate (TOPROL-XL) 50 MG 24 hr tablet Take 1 tablet (50 mg total) by mouth daily. Take with or immediately following a meal.   pantoprazole (PROTONIX) 40 MG tablet Take 1 tablet (40 mg total) by mouth daily.   rosuvastatin (CRESTOR) 10 MG tablet Take 1 tablet (10 mg total) by mouth daily.   sitaGLIPtin-metformin (JANUMET) 50-500 MG tablet Take 1 tablet by mouth 2 (two) times daily with a meal.   No current facility-administered medications for this visit. (Other)   REVIEW  OF SYSTEMS: ROS   Positive for: Endocrine, Eyes Negative for: Constitutional, Gastrointestinal, Neurological, Skin, Genitourinary, Musculoskeletal, HENT, Cardiovascular, Respiratory, Psychiatric, Allergic/Imm, Heme/Lymph Last edited by Etheleen Mayhew, COT on 08/22/2023  1:21 PM.     ALLERGIES Allergies  Allergen Reactions   Penicillins    Zocor [Simvastatin]    PAST MEDICAL HISTORY Past Medical History:  Diagnosis Date   CAD (coronary artery disease)    1999 occluded LAD treated with PCI and stenting, 80% stenosis of the first diagonal feeling angioplasty, 60-70% second stenosis, 50% ostial left main stenosis, 25% right coronary artery stenosis, 50% ostial PDA stenosis.   Cancer [facility])    prostate (treated with radiation)   COVID-19    Diabetes mellitus without complication (HCC)    Hyperlipidemia    Hypertension    Joint pain    Past Surgical History:  Procedure Laterality Date   CARDIAC CATHETERIZATION     COLON SURGERY     hemrrhoids   CORONARY ANGIOPLASTY WITH STENT PLACEMENT     FAMILY HISTORY Family History  Problem Relation Age of Onset   Diabetes Mother    Stroke Mother    Cancer Father        skin, prostate, stomach   Healthy Daughter    Healthy Son    Asthma Son    SOCIAL HISTORY Social  History   Tobacco Use   Smoking status: Former    Current packs/day: 0.00    Types: Cigarettes    Quit date: 03/28/1998    Years since quitting: 25.4   Smokeless tobacco: Never  Vaping Use   Vaping status: Never Used  Substance Use Topics   Alcohol use: No   Drug use: No       OPHTHALMIC EXAM:  Base Eye Exam     Visual Acuity (Snellen - Linear)       Right Left   Dist cc 20/40 -3 20/20   Dist ph cc NI     Correction: Glasses         Tonometry (Tonopen, 1:28 PM)       Right Left   Pressure 15 16         Pupils       Pupils Dark Light Shape React APD   Right PERRL 3 2 Round Brisk None   Left PERRL 3 2 Round Brisk None          Visual Fields       Left Right    Full Full         Extraocular Movement       Right Left    Full, Ortho Full, Ortho         Neuro/Psych     Oriented x3: Yes   Mood/Affect: Normal         Dilation     Both eyes: 2.5% Phenylephrine @ 1:29 PM           Slit Lamp and Fundus Exam     Slit Lamp Exam       Right Left   Lids/Lashes Dermatochalasis - upper lid, Meibomian gland dysfunction Dermatochalasis - upper lid, Meibomian gland dysfunction   Conjunctiva/Sclera White and quiet nasal and temporal pinguecula   Cornea arcus Trace tear film debris   Anterior Chamber deep and clear deep and clear   Iris Round and dilated, No NVI Round and dilated, No NVI   Lens 2+ Nuclear sclerosis with brunescence, 2+ Cortical cataract 2+ Nuclear sclerosis with brunescence, 2+ Cortical cataract   Anterior Vitreous mild syneresis mild syneresis         Fundus Exam       Right Left   Disc Pink and Sharp, +PPP Pink and Sharp, mild PPP   C/D Ratio 0.2 0.2   Macula Blunted foveal reflex, central edema / cystic changes -- slightly improved, scattered MA/DBH greatest centrally -- slightly improved Flat, Blunted foveal reflex, scattered MA, trace cystic changes -- improved   Vessels attenuated, Tortuous attenuated, Tortuous   Periphery Attached, 360 MA / DBH -- improved Attached, rare MA           Refraction     Wearing Rx       Sphere Cylinder Axis Add   Right -1.50 +0.75 114 +2.50   Left -1.50 +0.25 163 +2.50            IMAGING AND PROCEDURES  Imaging and Procedures for 08/22/2023  OCT, Retina - OU - Both Eyes       Right Eye Quality was good. Central Foveal Thickness: 344. Progression has improved. Findings include no SRF, abnormal foveal contour, intraretinal hyper-reflective material, intraretinal fluid, vitreomacular adhesion (Persistent central IRF/IRHM -- slightly improved).   Left Eye Quality was good. Central Foveal Thickness: 310. Progression has  improved. Findings include normal foveal contour, no SRF, intraretinal fluid (Trace persistent focal  cystic changes ST mac -- slightly improved).   Notes *Images captured and stored on drive  Diagnosis / Impression:  OD: +DME/CME - Persistent central IRF/IRHM -- slightly improved OS: trace, persistent focal cystic changes ST mac -- slightly improved  Clinical management:  See below  Abbreviations: NFP - Normal foveal profile. CME - cystoid macular edema. PED - pigment epithelial detachment. IRF - intraretinal fluid. SRF - subretinal fluid. EZ - ellipsoid zone. ERM - epiretinal membrane. ORA - outer retinal atrophy. ORT - outer retinal tubulation. SRHM - subretinal hyper-reflective material. IRHM - intraretinal hyper-reflective material      Intravitreal Injection, Pharmacologic Agent - OD - Right Eye       Time Out 08/22/2023. 2:11 PM. Confirmed correct patient, procedure, site, and patient consented.   Anesthesia Topical anesthesia was used. Anesthetic medications included Lidocaine 2%, Proparacaine 0.5%.   Procedure Preparation included 5% betadine to ocular surface, eyelid speculum. A (32g) needle was used.   Injection: 1.25 mg Bevacizumab 1.25mg /0.70ml   Route: Intravitreal, Site: Right Eye   NDC: [id number], Lot: [id number], Expiration date: 09/15/2023   Post-op Post injection exam found visual acuity of at least counting fingers. The patient tolerated the procedure well. There were no complications. The patient received written and verbal post procedure care education. Post injection medications were not given.            ASSESSMENT/PLAN:    ICD-10-CM   1. Moderate nonproliferative diabetic retinopathy of both eyes with macular edema associated with type 2 diabetes mellitus (HCC)  E11.3313 OCT, Retina - OU - Both Eyes    Intravitreal Injection, Pharmacologic Agent - OD - Right Eye    Bevacizumab (AVASTIN) SOLN 1.25 mg    2. Long term (current) use of oral  hypoglycemic drugs  Z79.84     3. Essential hypertension  I10     4. Hypertensive retinopathy of both eyes  H35.033     5. Combined forms of age-related cataract of both eyes  H25.813      1,2. Moderate Non-proliferative diabetic retinopathy, both eyes (OD>OS)  - A1c 5.6 (01.14.25) - exam shows scattered MA OU (OD > OS) - s/p IVA OD #1 (04.08.24), #2 (05.06.24), #3 (06.03.24), #4 (07.01.24), #5 (07.29.24), #6 (08.26.24), #7 (01.27.25) **switched back to IVA due to loss of [employer] funding** - s/p IVE OD #1 (09.27.24), #2 (10.28.24), #3 (11.25.24), #4 (12.30.24) - FA (04.08.24) shows OD: scattered leaking MA greatest centrally; no NV; Large area of vascular non-perfusion temporal periphery with +perivascular leakage; OS: Scattered MA with late leakage, no NV - BCVA OD stable at 20/40, OS 20/20 -- stable - OCT shows OD: +DME/CME - Persistent central IRF/IRHM -- slightly improved; OS: trace, persistent focal cystic changes ST mac -- slightly improved at 4 weeks - recommend IVA OD #8 today, 02.24.25 for DME w/ f/u in 4 wks - pt wishes to proceed - RBA of procedure discussed, questions answered - IVA informed consent obtained and signed, 01.27.25 (OD) - see procedure note - [employer] approved for 2025 -- but no funding for [employer] - f/u in 4 wks -- DFE/OCT, possible injection  3,4. Hypertensive retinopathy OU - discussed importance of tight BP control - possible RVO/CME component to macular edema OD - monitor  5. Mixed Cataract OU - The symptoms of cataract, surgical options, and treatments and risks were discussed with patient. - discussed diagnosis and progression - referred to [facility] for consult -- pt is deferring consult for  now  Ophthalmic Meds Ordered this visit:  Meds ordered this encounter  Medications   Bevacizumab (AVASTIN) SOLN 1.25 mg     Return in about 4 weeks (around 09/19/2023) for f/u NPDR OU, DFE, OCT, Possible Injxn.  There are no Patient  Instructions on file for this visit.  Explained the diagnoses, plan, and follow up with the patient and they expressed understanding.  Patient expressed understanding of the importance of proper follow up care.   This document serves as a record of services personally performed by Karie Chimera, MD, PhD. It was created on their behalf by Glee Arvin. Manson Passey, OA an ophthalmic technician. The creation of this record is the provider's dictation and/or activities during the visit.    Electronically signed by: Glee Arvin. Manson Passey, OA 08/23/23 12:58 AM  Karie Chimera, M.D., Ph.D. Diseases & Surgery of the Retina and Vitreous [facility]  I have reviewed the above documentation for accuracy and completeness, and I agree with the above. Karie Chimera, M.D., Ph.D. 08/23/23 1:01 AM   Abbreviations: M myopia (nearsighted); A astigmatism; H hyperopia (farsighted); P presbyopia; Mrx spectacle prescription;  CTL contact lenses; OD right eye; OS left eye; OU both eyes  XT exotropia; ET esotropia; PEK punctate epithelial keratitis; PEE punctate epithelial erosions; DES dry eye syndrome; MGD meibomian gland dysfunction; ATs artificial tears; PFAT's preservative free artificial tears; NSC nuclear sclerotic cataract; PSC posterior subcapsular cataract; ERM epi-retinal membrane; PVD posterior vitreous detachment; RD retinal detachment; DM diabetes mellitus; DR diabetic retinopathy; NPDR non-proliferative diabetic retinopathy; PDR proliferative diabetic retinopathy; CSME clinically significant macular edema; DME diabetic macular edema; dbh dot blot hemorrhages; CWS cotton wool spot; POAG primary open angle glaucoma; C/D cup-to-disc ratio; HVF humphrey visual field; GVF goldmann visual field; OCT optical coherence tomography; IOP intraocular pressure; BRVO Branch retinal vein occlusion; CRVO central retinal vein occlusion; CRAO central retinal artery occlusion; BRAO branch retinal artery occlusion; RT  retinal tear; SB scleral buckle; PPV pars plana vitrectomy; VH Vitreous hemorrhage; PRP panretinal laser photocoagulation; IVK intravitreal kenalog; VMT vitreomacular traction; MH Macular hole;  NVD neovascularization of the disc; NVE neovascularization elsewhere; AREDS age related eye disease study; ARMD age related macular degeneration; POAG primary open angle glaucoma; EBMD epithelial/anterior basement membrane dystrophy; ACIOL anterior chamber intraocular lens; IOL intraocular lens; PCIOL posterior chamber intraocular lens; Phaco/IOL phacoemulsification with intraocular lens placement; PRK photorefractive keratectomy; LASIK laser assisted in situ keratomileusis; HTN hypertension; DM diabetes mellitus; COPD chronic obstructive pulmonary disease

## 2023-08-22 ENCOUNTER — Encounter (INDEPENDENT_AMBULATORY_CARE_PROVIDER_SITE_OTHER): Payer: Self-pay | Admitting: Ophthalmology

## 2023-08-22 ENCOUNTER — Ambulatory Visit (INDEPENDENT_AMBULATORY_CARE_PROVIDER_SITE_OTHER): Payer: Medicare HMO | Admitting: Ophthalmology

## 2023-08-22 DIAGNOSIS — H35033 Hypertensive retinopathy, bilateral: Secondary | ICD-10-CM

## 2023-08-22 DIAGNOSIS — I10 Essential (primary) hypertension: Secondary | ICD-10-CM | POA: Diagnosis not present

## 2023-08-22 DIAGNOSIS — H25813 Combined forms of age-related cataract, bilateral: Secondary | ICD-10-CM | POA: Diagnosis not present

## 2023-08-22 DIAGNOSIS — Z7984 Long term (current) use of oral hypoglycemic drugs: Secondary | ICD-10-CM | POA: Diagnosis not present

## 2023-08-22 DIAGNOSIS — E113313 Type 2 diabetes mellitus with moderate nonproliferative diabetic retinopathy with macular edema, bilateral: Secondary | ICD-10-CM | POA: Diagnosis not present

## 2023-08-22 MED ORDER — BEVACIZUMAB CHEMO INJECTION 1.25MG/0.05ML SYRINGE FOR KALEIDOSCOPE
1.2500 mg | INTRAVITREAL | Status: AC | PRN
  Administered 2023-08-22: 1.25 mg via INTRAVITREAL

## 2023-08-23 ENCOUNTER — Encounter: Payer: Self-pay | Admitting: *Deleted

## 2023-09-14 NOTE — Progress Notes (Signed)
 [facility] - Clinic Note  09/19/2023     CHIEF COMPLAINT Patient presents for Retina Follow Up   HISTORY OF PRESENT ILLNESS: Arthur Torres is a 73 y.o. male who presents to the clinic today for:   HPI     Retina Follow Up   Patient presents with  Diabetic Retinopathy.  In both eyes.  This started 4 weeks ago.  Duration of 4 weeks.  Since onset it is stable.  I, the attending physician,  performed the HPI with the patient and updated documentation appropriately.        Comments   4 week retina follow up NPDR ou and IVA OD. Pt states no noticeable changes in vision. Pt denies any flashes or floaters. Pt's last A1c was 5.6, pt is not monitoring blood sugar.       Last edited by Rennis Chris, MD on 09/19/2023  3:22 PM.    Pt states vision is stable   Referring physician: Bennie Pierini, FNP [address] [place],  [place] [address]  HISTORICAL INFORMATION:   Selected notes from the MEDICAL RECORD NUMBER Referred by Dr. Despina Arias for DM exam LEE:  Ocular Hx- PMH-    CURRENT MEDICATIONS: No current outpatient medications on file. (Ophthalmic Drugs)   No current facility-administered medications for this visit. (Ophthalmic Drugs)   Current Outpatient Medications (Other)  Medication Sig   aspirin EC 81 MG tablet Take 81 mg by mouth daily. Swallow whole.   glipiZIDE (GLUCOTROL) 5 MG tablet TAKE 1 TABLET (5 MG TOTAL) BY MOUTH TWICE A DAY BEFORE MEALS   metoprolol succinate (TOPROL-XL) 50 MG 24 hr tablet Take 1 tablet (50 mg total) by mouth daily. Take with or immediately following a meal.   pantoprazole (PROTONIX) 40 MG tablet Take 1 tablet (40 mg total) by mouth daily.   rosuvastatin (CRESTOR) 10 MG tablet Take 1 tablet (10 mg total) by mouth daily.   sitaGLIPtin-metformin (JANUMET) 50-500 MG tablet Take 1 tablet by mouth 2 (two) times daily with a meal.   aspirin 325 MG EC tablet Take 81 mg by mouth daily. (Patient not taking: Reported on  09/19/2023)   No current facility-administered medications for this visit. (Other)   REVIEW OF SYSTEMS: ROS   Positive for: Endocrine, Eyes Negative for: Constitutional, Gastrointestinal, Neurological, Skin, Genitourinary, Musculoskeletal, HENT, Cardiovascular, Respiratory, Psychiatric, Allergic/Imm, Heme/Lymph Last edited by Elicia Lamp, COT on 09/19/2023  1:26 PM.     ALLERGIES Allergies  Allergen Reactions   Penicillins    Zocor [Simvastatin]    PAST MEDICAL HISTORY Past Medical History:  Diagnosis Date   CAD (coronary artery disease)    1999 occluded LAD treated with PCI and stenting, 80% stenosis of the first diagonal feeling angioplasty, 60-70% second stenosis, 50% ostial left main stenosis, 25% right coronary artery stenosis, 50% ostial PDA stenosis.   Cancer [facility])    prostate (treated with radiation)   COVID-19    Diabetes mellitus without complication (HCC)    Hyperlipidemia    Hypertension    Joint pain    Past Surgical History:  Procedure Laterality Date   CARDIAC CATHETERIZATION     COLON SURGERY     hemrrhoids   CORONARY ANGIOPLASTY WITH STENT PLACEMENT     FAMILY HISTORY Family History  Problem Relation Age of Onset   Diabetes Mother    Stroke Mother    Cancer Father        skin, prostate, stomach   Healthy  Daughter    Healthy Son    Asthma Son    SOCIAL HISTORY Social History   Tobacco Use   Smoking status: Former    Current packs/day: 0.00    Types: Cigarettes    Quit date: 03/28/1998    Years since quitting: 25.4   Smokeless tobacco: Never  Vaping Use   Vaping status: Never Used  Substance Use Topics   Alcohol use: No   Drug use: No       OPHTHALMIC EXAM:  Base Eye Exam     Visual Acuity (Snellen - Linear)       Right Left   Dist cc 20/40 -2 20/20 -1   Dist ph cc NI NI    Correction: Glasses         Tonometry (Tonopen, 1:20 PM)       Right Left   Pressure 14 11         Pupils       Dark Light Shape React APD    Right 3 2 Round Brisk None   Left 3 2 Round Brisk None         Visual Fields       Left Right    Full Full         Extraocular Movement       Right Left    Full, Ortho Full, Ortho         Neuro/Psych     Oriented x3: Yes   Mood/Affect: Normal         Dilation     Both eyes: 1.0% Mydriacyl, 2.5% Phenylephrine @ 1:20 PM           Slit Lamp and Fundus Exam     Slit Lamp Exam       Right Left   Lids/Lashes Dermatochalasis - upper lid, Meibomian gland dysfunction Dermatochalasis - upper lid, Meibomian gland dysfunction   Conjunctiva/Sclera White and quiet nasal and temporal pinguecula   Cornea arcus Trace tear film debris   Anterior Chamber deep and clear deep and clear   Iris Round and dilated, No NVI Round and dilated, No NVI   Lens 2+ Nuclear sclerosis with brunescence, 2+ Cortical cataract 2+ Nuclear sclerosis with brunescence, 2+ Cortical cataract   Anterior Vitreous mild syneresis mild syneresis         Fundus Exam       Right Left   Disc Pink and Sharp, +PPP Pink and Sharp, mild PPP   C/D Ratio 0.2 0.2   Macula Blunted foveal reflex, central edema / cystic changes, scattered MA/DBH greatest centrally Flat, Blunted foveal reflex, scattered MA, trace cystic changes   Vessels attenuated, Tortuous attenuated, Tortuous   Periphery Attached, 360 MA / DBH -- improved Attached, rare MA           Refraction     Wearing Rx       Sphere Cylinder Axis Add   Right -1.50 +0.75 114 +2.50   Left -1.50 +0.25 163 +2.50            IMAGING AND PROCEDURES  Imaging and Procedures for 09/19/2023  OCT, Retina - OU - Both Eyes       Right Eye Quality was good. Central Foveal Thickness: 346. Progression has been stable. Findings include no SRF, abnormal foveal contour, intraretinal hyper-reflective material, intraretinal fluid, vitreomacular adhesion (Persistent central IRF/IRHM).   Left Eye Quality was good. Central Foveal Thickness: 310.  Progression has been stable. Findings include normal foveal  contour, no SRF, intraretinal fluid (Trace persistent focal cystic changes ST fovea and mac).   Notes *Images captured and stored on drive  Diagnosis / Impression:  OD: +DME/CME - Persistent central IRF/IRHM -- slightly improved OS: trace, persistent focal cystic changes ST fovea and mac  Clinical management:  See below  Abbreviations: NFP - Normal foveal profile. CME - cystoid macular edema. PED - pigment epithelial detachment. IRF - intraretinal fluid. SRF - subretinal fluid. EZ - ellipsoid zone. ERM - epiretinal membrane. ORA - outer retinal atrophy. ORT - outer retinal tubulation. SRHM - subretinal hyper-reflective material. IRHM - intraretinal hyper-reflective material      Intravitreal Injection, Pharmacologic Agent - OD - Right Eye       Time Out 09/19/2023. 2:01 PM. Confirmed correct patient, procedure, site, and patient consented.   Anesthesia Topical anesthesia was used. Anesthetic medications included Lidocaine 2%, Proparacaine 0.5%.   Procedure Preparation included 5% betadine to ocular surface, eyelid speculum. A supplied (32g) needle was used.   Injection: 1.25 mg Bevacizumab 1.25mg /0.70ml   Route: Intravitreal, Site: Right Eye   NDC: [id number], Lot: [id number], Expiration date: 10/29/2023   Post-op Post injection exam found visual acuity of at least counting fingers. The patient tolerated the procedure well. There were no complications. The patient received written and verbal post procedure care education. Post injection medications were not given.            ASSESSMENT/PLAN:    ICD-10-CM   1. Moderate nonproliferative diabetic retinopathy of both eyes with macular edema associated with type 2 diabetes mellitus (HCC)  E11.3313 OCT, Retina - OU - Both Eyes    Intravitreal Injection, Pharmacologic Agent - OD - Right Eye    Bevacizumab (AVASTIN) SOLN 1.25 mg    2. Long term (current) use of oral  hypoglycemic drugs  Z79.84     3. Essential hypertension  I10     4. Hypertensive retinopathy of both eyes  H35.033     5. Combined forms of age-related cataract of both eyes  H25.813      1,2. Moderate Non-proliferative diabetic retinopathy, both eyes (OD>OS)  - A1c 5.6 (01.14.25) - exam shows scattered MA OU (OD > OS) - s/p IVA OD #1 (04.08.24), #2 (05.06.24), #3 (06.03.24), #4 (07.01.24), #5 (07.29.24), #6 (08.26.24), #7 (01.27.25), #8 (02.24.25) - s/p IVE OD #1 (09.27.24), #2 (10.28.24), #3 (11.25.24), #4 (12.30.24) - FA (04.08.24) shows OD: scattered leaking MA greatest centrally; no NV; Large area of vascular non-perfusion temporal periphery with +perivascular leakage; OS: Scattered MA with late leakage, no NV - BCVA OD stable at 20/40, OS 20/20 -- stable - OCT shows OD: Persistent central IRF/IRHM; OS: trace, persistent focal cystic changes ST fovea and mac at 4 weeks - recommend IVA OD #9 today, 03.24.25 for DME w/ f/u in 4 wks - pt wishes to proceed - RBA of procedure discussed, questions answered - IVA informed consent obtained and signed, 01.27.25 (OD) - see procedure note - Eylea approved for 2025 -- but no funding for Good Days - f/u in 4 wks -- DFE/OCT, possible injection  3,4. Hypertensive retinopathy OU - discussed importance of tight BP control - possible RVO/CME component to macular edema OD - monitor  5. Mixed Cataract OU - The symptoms of cataract, surgical options, and treatments and risks were discussed with patient. - discussed diagnosis and progression - referred to [facility] for consult -- pt is deferring consult for now  Ophthalmic Meds Ordered this visit:  Meds ordered this encounter  Medications   Bevacizumab (AVASTIN) SOLN 1.25 mg     Return in about 4 weeks (around 10/17/2023) for f/u NPDR OU, DFE, OCT, Possible Injxn.  There are no Patient Instructions on file for this visit.  Explained the diagnoses, plan, and follow up  with the patient and they expressed understanding.  Patient expressed understanding of the importance of proper follow up care.   This document serves as a record of services personally performed by Karie Chimera, MD, PhD. It was created on their behalf by Glee Arvin. Manson Passey, OA an ophthalmic technician. The creation of this record is the provider's dictation and/or activities during the visit.    Electronically signed by: Glee Arvin. Manson Passey, OA 09/19/23 3:23 PM  Karie Chimera, M.D., Ph.D. Diseases & Surgery of the Retina and Vitreous [facility]  I have reviewed the above documentation for accuracy and completeness, and I agree with the above. Karie Chimera, M.D., Ph.D. 09/19/23 3:28 PM   Abbreviations: M myopia (nearsighted); A astigmatism; H hyperopia (farsighted); P presbyopia; Mrx spectacle prescription;  CTL contact lenses; OD right eye; OS left eye; OU both eyes  XT exotropia; ET esotropia; PEK punctate epithelial keratitis; PEE punctate epithelial erosions; DES dry eye syndrome; MGD meibomian gland dysfunction; ATs artificial tears; PFAT's preservative free artificial tears; NSC nuclear sclerotic cataract; PSC posterior subcapsular cataract; ERM epi-retinal membrane; PVD posterior vitreous detachment; RD retinal detachment; DM diabetes mellitus; DR diabetic retinopathy; NPDR non-proliferative diabetic retinopathy; PDR proliferative diabetic retinopathy; CSME clinically significant macular edema; DME diabetic macular edema; dbh dot blot hemorrhages; CWS cotton wool spot; POAG primary open angle glaucoma; C/D cup-to-disc ratio; HVF humphrey visual field; GVF goldmann visual field; OCT optical coherence tomography; IOP intraocular pressure; BRVO Branch retinal vein occlusion; CRVO central retinal vein occlusion; CRAO central retinal artery occlusion; BRAO branch retinal artery occlusion; RT retinal tear; SB scleral buckle; PPV pars plana vitrectomy; VH Vitreous hemorrhage;  PRP panretinal laser photocoagulation; IVK intravitreal kenalog; VMT vitreomacular traction; MH Macular hole;  NVD neovascularization of the disc; NVE neovascularization elsewhere; AREDS age related eye disease study; ARMD age related macular degeneration; POAG primary open angle glaucoma; EBMD epithelial/anterior basement membrane dystrophy; ACIOL anterior chamber intraocular lens; IOL intraocular lens; PCIOL posterior chamber intraocular lens; Phaco/IOL phacoemulsification with intraocular lens placement; PRK photorefractive keratectomy; LASIK laser assisted in situ keratomileusis; HTN hypertension; DM diabetes mellitus; COPD chronic obstructive pulmonary disease

## 2023-09-19 ENCOUNTER — Encounter (INDEPENDENT_AMBULATORY_CARE_PROVIDER_SITE_OTHER): Payer: Self-pay | Admitting: Ophthalmology

## 2023-09-19 ENCOUNTER — Ambulatory Visit (INDEPENDENT_AMBULATORY_CARE_PROVIDER_SITE_OTHER): Payer: Medicare HMO | Admitting: Ophthalmology

## 2023-09-19 DIAGNOSIS — I10 Essential (primary) hypertension: Secondary | ICD-10-CM | POA: Diagnosis not present

## 2023-09-19 DIAGNOSIS — Z7984 Long term (current) use of oral hypoglycemic drugs: Secondary | ICD-10-CM

## 2023-09-19 DIAGNOSIS — E113313 Type 2 diabetes mellitus with moderate nonproliferative diabetic retinopathy with macular edema, bilateral: Secondary | ICD-10-CM | POA: Diagnosis not present

## 2023-09-19 DIAGNOSIS — H25813 Combined forms of age-related cataract, bilateral: Secondary | ICD-10-CM | POA: Diagnosis not present

## 2023-09-19 DIAGNOSIS — H35033 Hypertensive retinopathy, bilateral: Secondary | ICD-10-CM

## 2023-09-19 MED ORDER — BEVACIZUMAB CHEMO INJECTION 1.25MG/0.05ML SYRINGE FOR KALEIDOSCOPE
1.2500 mg | INTRAVITREAL | Status: AC | PRN
  Administered 2023-09-19: 1.25 mg via INTRAVITREAL

## 2023-10-10 ENCOUNTER — Ambulatory Visit: Payer: Medicare HMO | Admitting: Nurse Practitioner

## 2023-10-12 NOTE — Progress Notes (Signed)
 [facility] - Clinic Note  10/17/2023     CHIEF COMPLAINT Patient presents for Retina Follow Up   HISTORY OF PRESENT ILLNESS: Arthur Torres is a 73 y.o. male who presents to the clinic today for:   HPI     Retina Follow Up   Patient presents with  Diabetic Retinopathy.  In both eyes.  This started 4 weeks ago.  Duration of 4 weeks.  Since onset it is stable.  I, the attending physician,  performed the HPI with the patient and updated documentation appropriately.        Comments   Patient feels the vision is the same. His A1C is 6. He is not using eye drops at this time.       Last edited by Ronelle Coffee, MD on 10/19/2023  8:29 AM.      Referring physician: Delfina Feller, FNP [address] [place],  [place] [address]  HISTORICAL INFORMATION:   Selected notes from the MEDICAL RECORD NUMBER Referred by Dr. Reford Canterbury for DM exam LEE:  Ocular Hx- PMH-    CURRENT MEDICATIONS: No current outpatient medications on file. (Ophthalmic Drugs)   No current facility-administered medications for this visit. (Ophthalmic Drugs)   Current Outpatient Medications (Other)  Medication Sig   aspirin EC 81 MG tablet Take 81 mg by mouth daily. Swallow whole.   glipiZIDE  (GLUCOTROL ) 5 MG tablet TAKE 1 TABLET (5 MG TOTAL) BY MOUTH TWICE A DAY BEFORE MEALS   metFORMIN  (GLUCOPHAGE ) 1000 MG tablet Take 1 tablet (1,000 mg total) by mouth 2 (two) times daily with a meal.   metoprolol  succinate (TOPROL -XL) 50 MG 24 hr tablet Take 1 tablet (50 mg total) by mouth daily. Take with or immediately following a meal.   pantoprazole  (PROTONIX ) 40 MG tablet Take 1 tablet (40 mg total) by mouth daily.   rosuvastatin  (CRESTOR ) 10 MG tablet Take 1 tablet (10 mg total) by mouth daily.   No current facility-administered medications for this visit. (Other)   REVIEW OF SYSTEMS: ROS   Positive for: Endocrine, Eyes Negative for: Constitutional, Gastrointestinal, Neurological,  Skin, Genitourinary, Musculoskeletal, HENT, Cardiovascular, Respiratory, Psychiatric, Allergic/Imm, Heme/Lymph Last edited by Olene Berne, COT on 10/17/2023 12:14 PM.      ALLERGIES Allergies  Allergen Reactions   Penicillins    Zocor [Simvastatin]    PAST MEDICAL HISTORY Past Medical History:  Diagnosis Date   CAD (coronary artery disease)    1999 occluded LAD treated with PCI and stenting, 80% stenosis of the first diagonal feeling angioplasty, 60-70% second stenosis, 50% ostial left main stenosis, 25% right coronary artery stenosis, 50% ostial PDA stenosis.   Cancer [facility])    prostate (treated with radiation)   COVID-19    Diabetes mellitus without complication (HCC)    Hyperlipidemia    Hypertension    Joint pain    Past Surgical History:  Procedure Laterality Date   CARDIAC CATHETERIZATION     COLON SURGERY     hemrrhoids   CORONARY ANGIOPLASTY WITH STENT PLACEMENT     FAMILY HISTORY Family History  Problem Relation Age of Onset   Diabetes Mother    Stroke Mother    Cancer Father        skin, prostate, stomach   Healthy Daughter    Healthy Son    Asthma Son    SOCIAL HISTORY Social History   Tobacco Use   Smoking status: Former    Current packs/day: 0.00  Types: Cigarettes    Quit date: 03/28/1998    Years since quitting: 25.5   Smokeless tobacco: Never  Vaping Use   Vaping status: Never Used  Substance Use Topics   Alcohol use: No   Drug use: No       OPHTHALMIC EXAM:  Base Eye Exam     Visual Acuity (Snellen - Linear)       Right Left   Dist cc 20/40 20/20   Dist ph cc NI     Correction: Glasses         Tonometry (Tonopen, 12:18 PM)       Right Left   Pressure 15 14         Pupils       Dark Light Shape React APD   Right 3 2 Round Brisk None   Left 3 2 Round Brisk None         Visual Fields       Left Right    Full Full         Extraocular Movement       Right Left    Full, Ortho Full, Ortho          Neuro/Psych     Oriented x3: Yes   Mood/Affect: Normal         Dilation     Both eyes: 1.0% Mydriacyl, 2.5% Phenylephrine @ 12:14 PM           Slit Lamp and Fundus Exam     Slit Lamp Exam       Right Left   Lids/Lashes Dermatochalasis - upper lid, Meibomian gland dysfunction Dermatochalasis - upper lid, Meibomian gland dysfunction   Conjunctiva/Sclera White and quiet nasal and temporal pinguecula   Cornea arcus Trace tear film debris   Anterior Chamber deep and clear deep and clear   Iris Round and dilated, No NVI Round and dilated, No NVI   Lens 2+ Nuclear sclerosis with brunescence, 2+ Cortical cataract 2+ Nuclear sclerosis with brunescence, 2+ Cortical cataract   Anterior Vitreous mild syneresis mild syneresis         Fundus Exam       Right Left   Disc Pink and Sharp, +PPP Pink and Sharp, mild PPP   C/D Ratio 0.2 0.2   Macula Blunted foveal reflex, central edema / cystic changes -- slightly improved, scattered MA/DBH Flat, Blunted foveal reflex, scattered MA, trace cystic changes   Vessels attenuated, Tortuous attenuated, Tortuous   Periphery Attached, 360 MA / DBH -- improved Attached, rare MA           Refraction     Wearing Rx       Sphere Cylinder Axis Add   Right -1.50 +0.75 114 +2.50   Left -1.50 +0.25 163 +2.50            IMAGING AND PROCEDURES  Imaging and Procedures for 10/17/2023  OCT, Retina - OU - Both Eyes       Right Eye Quality was good. Central Foveal Thickness: 340. Progression has improved. Findings include no SRF, abnormal foveal contour, intraretinal hyper-reflective material, intraretinal fluid, vitreomacular adhesion (Persistent central IRF/IRHM -- slightly improved).   Left Eye Quality was good. Central Foveal Thickness: 311. Progression has been stable. Findings include normal foveal contour, no SRF, intraretinal fluid (Trace persistent focal cystic changes ST fovea and mac).   Notes *Images captured and stored  on drive  Diagnosis / Impression:  OD: +DME/CME - Persistent central IRF/IRHM --  slightly improved OS: trace, persistent focal cystic changes ST fovea and mac  Clinical management:  See below  Abbreviations: NFP - Normal foveal profile. CME - cystoid macular edema. PED - pigment epithelial detachment. IRF - intraretinal fluid. SRF - subretinal fluid. EZ - ellipsoid zone. ERM - epiretinal membrane. ORA - outer retinal atrophy. ORT - outer retinal tubulation. SRHM - subretinal hyper-reflective material. IRHM - intraretinal hyper-reflective material      Intravitreal Injection, Pharmacologic Agent - OD - Right Eye       Time Out 10/17/2023. 12:28 PM. Confirmed correct patient, procedure, site, and patient consented.   Anesthesia Topical anesthesia was used. Anesthetic medications included Lidocaine 2%, Proparacaine 0.5%.   Procedure Preparation included 5% betadine to ocular surface, eyelid speculum. A supplied (32g) needle was used.   Injection: 1.25 mg Bevacizumab  1.25mg /0.29ml   Route: Intravitreal, Site: Right Eye   NDC: [id number], Lot: [id number], Expiration date: 11/12/2023   Post-op Post injection exam found visual acuity of at least counting fingers. The patient tolerated the procedure well. There were no complications. The patient received written and verbal post procedure care education. Post injection medications were not given.      Bayer DCA Hb A1c Waived      Component Value Flag Ref Range Units Status   HB A1C (BAYER DCA - WAIVED) 6.0      4.8 - 5.6 % Final   Comment:            Prediabetes: 5.7 - 6.4          Diabetes: >6.4          Glycemic control for adults with diabetes: <7.0            CBC with Differential/Platelet      Component Value Flag Ref Range Units Status   WBC 5.8      3.4 - 10.8 x10E3/uL Final   RBC 5.38      4.14 - 5.80 x10E6/uL Final   Hemoglobin 15.9      13.0 - 17.7 g/dL Final   Hematocrit 75.6      37.5 - 51.0 % Final   MCV 89       79 - 97 fL Final   MCH 29.6      26.6 - 33.0 pg Final   MCHC 33.3      31.5 - 35.7 g/dL Final   RDW 43.3      29.5 - 15.4 % Final   Platelets 154      150 - 450 x10E3/uL Final   Neutrophils 59      Not Estab. % Final   Lymphs 29      Not Estab. % Final   Monocytes 8      Not Estab. % Final   Eos 3      Not Estab. % Final   Basos 1      Not Estab. % Final   Neutrophils Absolute 3.4      1.4 - 7.0 x10E3/uL Final   Lymphocytes Absolute 1.7      0.7 - 3.1 x10E3/uL Final   Monocytes Absolute 0.4      0.1 - 0.9 x10E3/uL Final   EOS (ABSOLUTE) 0.2      0.0 - 0.4 x10E3/uL Final   Basophils Absolute 0.1      0.0 - 0.2 x10E3/uL Final   Immature Granulocytes 0      Not Estab. % Final   Immature Grans (Abs) 0.0  0.0 - 0.1 x10E3/uL Final           CMP14+EGFR      Component Value Flag Ref Range Units Status   Glucose 129      70 - 99 mg/dL Final   BUN 28      8 - 27 mg/dL Final   Creatinine, Ser 1.05      0.76 - 1.27 mg/dL Final   eGFR 76      >16 mL/min/1.73 Final   BUN/Creatinine Ratio 27      10 - 24  Final   Sodium 140      134 - 144 mmol/L Final   Potassium 4.8      3.5 - 5.2 mmol/L Final   Chloride 101      96 - 106 mmol/L Final   CO2 21      20 - 29 mmol/L Final   Calcium  9.9      8.6 - 10.2 mg/dL Final   Total Protein 6.8      6.0 - 8.5 g/dL Final   Albumin 4.7      3.9 - 4.9 g/dL Final   Globulin, Total 2.1      1.5 - 4.5 g/dL Final   Bilirubin Total 0.5      0.0 - 1.2 mg/dL Final   Alkaline Phosphatase 75      44 - 121 IU/L Final   AST 16      0 - 40 IU/L Final   ALT 20      0 - 44 IU/L Final           Lipid panel      Component Value Flag Ref Range Units Status   Cholesterol, Total 121      100 - 199 mg/dL Final   Triglycerides 75      0 - 149 mg/dL Final   HDL 43      >10 mg/dL Final   VLDL Cholesterol Cal 15      5 - 40 mg/dL Final   LDL Chol Calc (NIH) 63      0 - 99 mg/dL Final   Chol/HDL Ratio 2.8      0.0 - 5.0 ratio Final   Comment:                                      T. Chol/HDL Ratio                                             Men  Women                               1/2 Avg.Risk  3.4    3.3                                   Avg.Risk  5.0    4.4                                2X Avg.Risk  9.6    7.1  3X Avg.Risk 23.4   11.0            DG Chest 2 View       EXAM: 2 VIEW XRAY OF THE CHEST 10/17/2023 09:25:11 AM  COMPARISON: 10/04/2014  CLINICAL HISTORY: Smoker history.  FINDINGS:  LUNGS AND PLEURA: Stable lingular scarring. No consolidation. No pulmonary edema. No pleural effusion. No pneumothorax.  HEART AND MEDIASTINUM: No acute abnormality of the cardiac and mediastinal silhouettes.  BONES: Degenerative changes of the visualized thoracolumbar spine. No acute osseous abnormality.  IMPRESSION: 1. No acute findings.  Electronically signed by: Zadie Herter MD 10/17/2023 11:33 PM EDT RP Workstation: [id number]            ASSESSMENT/PLAN:    ICD-10-CM   1. Moderate nonproliferative diabetic retinopathy of both eyes with macular edema associated with type 2 diabetes mellitus (HCC)  E11.3313 OCT, Retina - OU - Both Eyes    Intravitreal Injection, Pharmacologic Agent - OD - Right Eye    Bevacizumab  (AVASTIN ) SOLN 1.25 mg    2. Long term (current) use of oral hypoglycemic drugs  Z79.84     3. Essential hypertension  I10     4. Hypertensive retinopathy of both eyes  H35.033     5. Combined forms of age-related cataract of both eyes  H25.813      1,2. Moderate Non-proliferative diabetic retinopathy, both eyes (OD>OS)  - A1c 5.6 (01.14.25) - exam shows scattered MA OU (OD > OS) - s/p IVA OD #1 (04.08.24), #2 (05.06.24), #3 (06.03.24), #4 (07.01.24), #5 (07.29.24), #6 (08.26.24), #7 (01.27.25), #8 (02.24.25), #9 (03.24.25) - s/p IVE OD #1 (09.27.24), #2 (10.28.24), #3 (11.25.24), #4 (12.30.24) - FA (04.08.24) shows OD: scattered leaking MA greatest centrally; no  NV; Large area of vascular non-perfusion temporal periphery with +perivascular leakage; OS: Scattered MA with late leakage, no NV - BCVA OD stable at 20/40, OS 20/20 -- stable - OCT shows OD: Persistent central IRF/IRHM -- slightly improved; OS: trace, persistent focal cystic changes ST fovea and mac at 4 weeks - recommend IVA OD #10 today, 04.21.25 for DME w/ f/u in 4 wks - pt wishes to proceed - RBA of procedure discussed, questions answered - IVA informed consent obtained and signed, 01.27.25 (OD) - see procedure note - Eylea  approved for 2025 -- but [employer] funding unavailable - f/u in 4 wks -- DFE/OCT, possible injection  3,4. Hypertensive retinopathy OU - discussed importance of tight BP control - possible RVO/CME component to macular edema OD - monitor  5. Mixed Cataract OU - The symptoms of cataract, surgical options, and treatments and risks were discussed with patient. - discussed diagnosis and progression - referred to [facility] [place] for consult -- pt is deferring consult for now  Ophthalmic Meds Ordered this visit:  Meds ordered this encounter  Medications   Bevacizumab  (AVASTIN ) SOLN 1.25 mg     Return in about 4 weeks (around 11/14/2023) for f/u NPDR OU, DFE, OCT, Possible Injxn.  There are no Patient Instructions on file for this visit.  Explained the diagnoses, plan, and follow up with the patient and they expressed understanding.  Patient expressed understanding of the importance of proper follow up care.   This document serves as a record of services personally performed by Jeanice Millard, MD, PhD. It was created on their behalf by Morley Arabia. Bevin Bucks, OA an ophthalmic technician. The creation of this record is the provider's dictation and/or activities during the visit.    Electronically  signed by: Morley Arabia. Bevin Bucks, OA 10/19/23 8:29 AM  Jeanice Millard, M.D., Ph.D. Diseases & Surgery of the Retina and Vitreous [facility]  I have reviewed the above documentation for accuracy and completeness, and I agree with the above. Jeanice Millard, M.D., Ph.D. 10/19/23 8:31 AM   Abbreviations: M myopia (nearsighted); A astigmatism; H hyperopia (farsighted); P presbyopia; Mrx spectacle prescription;  CTL contact lenses; OD right eye; OS left eye; OU both eyes  XT exotropia; ET esotropia; PEK punctate epithelial keratitis; PEE punctate epithelial erosions; DES dry eye syndrome; MGD meibomian gland dysfunction; ATs artificial tears; PFAT's preservative free artificial tears; NSC nuclear sclerotic cataract; PSC posterior subcapsular cataract; ERM epi-retinal membrane; PVD posterior vitreous detachment; RD retinal detachment; DM diabetes mellitus; DR diabetic retinopathy; NPDR non-proliferative diabetic retinopathy; PDR proliferative diabetic retinopathy; CSME clinically significant macular edema; DME diabetic macular edema; dbh dot blot hemorrhages; CWS cotton wool spot; POAG primary open angle glaucoma; C/D cup-to-disc ratio; HVF humphrey visual field; GVF goldmann visual field; OCT optical coherence tomography; IOP intraocular pressure; BRVO Branch retinal vein occlusion; CRVO central retinal vein occlusion; CRAO central retinal artery occlusion; BRAO branch retinal artery occlusion; RT retinal tear; SB scleral buckle; PPV pars plana vitrectomy; VH Vitreous hemorrhage; PRP panretinal laser photocoagulation; IVK intravitreal kenalog; VMT vitreomacular traction; MH Macular hole;  NVD neovascularization of the disc; NVE neovascularization elsewhere; AREDS age related eye disease study; ARMD age related macular degeneration; POAG primary open angle glaucoma; EBMD epithelial/anterior basement membrane dystrophy; ACIOL anterior chamber intraocular lens; IOL intraocular lens; PCIOL posterior chamber intraocular lens; Phaco/IOL phacoemulsification with intraocular lens placement; PRK photorefractive keratectomy; LASIK laser assisted in situ  keratomileusis; HTN hypertension; DM diabetes mellitus; COPD chronic obstructive pulmonary disease

## 2023-10-17 ENCOUNTER — Encounter (INDEPENDENT_AMBULATORY_CARE_PROVIDER_SITE_OTHER): Payer: Self-pay | Admitting: Ophthalmology

## 2023-10-17 ENCOUNTER — Ambulatory Visit (INDEPENDENT_AMBULATORY_CARE_PROVIDER_SITE_OTHER): Admitting: Ophthalmology

## 2023-10-17 ENCOUNTER — Ambulatory Visit (INDEPENDENT_AMBULATORY_CARE_PROVIDER_SITE_OTHER)

## 2023-10-17 ENCOUNTER — Ambulatory Visit: Payer: Medicare HMO | Admitting: Nurse Practitioner

## 2023-10-17 ENCOUNTER — Encounter: Payer: Self-pay | Admitting: Nurse Practitioner

## 2023-10-17 VITALS — BP 116/71 | HR 70 | Temp 98.1°F | Ht 70.0 in | Wt 177.0 lb

## 2023-10-17 DIAGNOSIS — K219 Gastro-esophageal reflux disease without esophagitis: Secondary | ICD-10-CM

## 2023-10-17 DIAGNOSIS — I10 Essential (primary) hypertension: Secondary | ICD-10-CM | POA: Diagnosis not present

## 2023-10-17 DIAGNOSIS — Z7984 Long term (current) use of oral hypoglycemic drugs: Secondary | ICD-10-CM | POA: Diagnosis not present

## 2023-10-17 DIAGNOSIS — H25813 Combined forms of age-related cataract, bilateral: Secondary | ICD-10-CM

## 2023-10-17 DIAGNOSIS — H35033 Hypertensive retinopathy, bilateral: Secondary | ICD-10-CM | POA: Diagnosis not present

## 2023-10-17 DIAGNOSIS — Z8546 Personal history of malignant neoplasm of prostate: Secondary | ICD-10-CM | POA: Diagnosis not present

## 2023-10-17 DIAGNOSIS — Z0001 Encounter for general adult medical examination with abnormal findings: Secondary | ICD-10-CM | POA: Diagnosis not present

## 2023-10-17 DIAGNOSIS — E119 Type 2 diabetes mellitus without complications: Secondary | ICD-10-CM

## 2023-10-17 DIAGNOSIS — E1169 Type 2 diabetes mellitus with other specified complication: Secondary | ICD-10-CM | POA: Diagnosis not present

## 2023-10-17 DIAGNOSIS — Z6827 Body mass index (BMI) 27.0-27.9, adult: Secondary | ICD-10-CM | POA: Diagnosis not present

## 2023-10-17 DIAGNOSIS — E113313 Type 2 diabetes mellitus with moderate nonproliferative diabetic retinopathy with macular edema, bilateral: Secondary | ICD-10-CM

## 2023-10-17 DIAGNOSIS — E785 Hyperlipidemia, unspecified: Secondary | ICD-10-CM | POA: Diagnosis not present

## 2023-10-17 DIAGNOSIS — Z87891 Personal history of nicotine dependence: Secondary | ICD-10-CM | POA: Diagnosis not present

## 2023-10-17 DIAGNOSIS — Z0000 Encounter for general adult medical examination without abnormal findings: Secondary | ICD-10-CM

## 2023-10-17 LAB — CMP14+EGFR
ALT: 20 IU/L (ref 0–44)
AST: 16 IU/L (ref 0–40)
Albumin: 4.7 g/dL (ref 3.9–4.9)
Alkaline Phosphatase: 75 IU/L (ref 44–121)
BUN/Creatinine Ratio: 27 — ABNORMAL HIGH (ref 10–24)
BUN: 28 mg/dL — ABNORMAL HIGH (ref 8–27)
Bilirubin Total: 0.5 mg/dL (ref 0.0–1.2)
CO2: 21 mmol/L (ref 20–29)
Calcium: 9.9 mg/dL (ref 8.6–10.2)
Chloride: 101 mmol/L (ref 96–106)
Creatinine, Ser: 1.05 mg/dL (ref 0.76–1.27)
Globulin, Total: 2.1 g/dL (ref 1.5–4.5)
Glucose: 129 mg/dL — ABNORMAL HIGH (ref 70–99)
Potassium: 4.8 mmol/L (ref 3.5–5.2)
Sodium: 140 mmol/L (ref 134–144)
Total Protein: 6.8 g/dL (ref 6.0–8.5)
eGFR: 76 mL/min/{1.73_m2} (ref >59)

## 2023-10-17 LAB — CBC WITH DIFFERENTIAL/PLATELET
Basophils Absolute: 0.1 10*3/uL (ref 0.0–0.2)
Basos: 1 %
EOS (ABSOLUTE): 0.2 10*3/uL (ref 0.0–0.4)
Eos: 3 %
Hematocrit: 47.8 % (ref 37.5–51.0)
Hemoglobin: 15.9 g/dL (ref 13.0–17.7)
Immature Grans (Abs): 0 10*3/uL (ref 0.0–0.1)
Immature Granulocytes: 0 %
Lymphocytes Absolute: 1.7 10*3/uL (ref 0.7–3.1)
Lymphs: 29 %
MCH: 29.6 pg (ref 26.6–33.0)
MCHC: 33.3 g/dL (ref 31.5–35.7)
MCV: 89 fL (ref 79–97)
Monocytes Absolute: 0.4 10*3/uL (ref 0.1–0.9)
Monocytes: 8 %
Neutrophils Absolute: 3.4 10*3/uL (ref 1.4–7.0)
Neutrophils: 59 %
Platelets: 154 10*3/uL (ref 150–450)
RBC: 5.38 x10E6/uL (ref 4.14–5.80)
RDW: 13.4 % (ref 11.6–15.4)
WBC: 5.8 10*3/uL (ref 3.4–10.8)

## 2023-10-17 LAB — LIPID PANEL
Chol/HDL Ratio: 2.8 ratio (ref 0.0–5.0)
Cholesterol, Total: 121 mg/dL (ref 100–199)
HDL: 43 mg/dL (ref >39)
LDL Chol Calc (NIH): 63 mg/dL (ref 0–99)
Triglycerides: 75 mg/dL (ref 0–149)
VLDL Cholesterol Cal: 15 mg/dL (ref 5–40)

## 2023-10-17 LAB — BAYER DCA HB A1C WAIVED: HB A1C (BAYER DCA - WAIVED): 6 % — ABNORMAL HIGH (ref 4.8–5.6)

## 2023-10-17 MED ORDER — METFORMIN HCL 1000 MG PO TABS: 1000.0000 mg | ORAL_TABLET | Freq: Two times a day (BID) | ORAL | 1 refills | Status: DC

## 2023-10-17 MED ORDER — PANTOPRAZOLE SODIUM 40 MG PO TBEC: 40.0000 mg | DELAYED_RELEASE_TABLET | Freq: Every day | ORAL | 1 refills | Status: DC

## 2023-10-17 MED ORDER — METOPROLOL SUCCINATE ER 50 MG PO TB24: 50.0000 mg | ORAL_TABLET | Freq: Every day | ORAL | 1 refills | Status: DC

## 2023-10-17 MED ORDER — GLIPIZIDE 5 MG PO TABS: ORAL_TABLET | ORAL | 1 refills | Status: DC

## 2023-10-17 MED ORDER — BEVACIZUMAB CHEMO INJECTION 1.25MG/0.05ML SYRINGE FOR KALEIDOSCOPE
1.2500 mg | INTRAVITREAL | Status: AC | PRN
  Administered 2023-10-17: 1.25 mg via INTRAVITREAL

## 2023-10-17 NOTE — Progress Notes (Signed)
 Subjective:    Patient ID: Arthur Torres, male    DOB: Jun 19, 1951, 73 y.o.   MRN: [id number]   Chief Complaint: annual physical   HPI:  Arthur Torres is a 73 y.o. who identifies as a male who was assigned male at birth.   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 Annual physical exam   2. Essential hypertension, benign No c/o chest pain, sob or headache. Does not check blood pressure athome. BP Readings from Last 3 Encounters:  07/12/23 (!) 141/74  04/01/23 130/74  12/31/22 114/63     3. Coronary artery disease due to lipid rich plaque Has not followed  up with cardiology in several years. Says he feels good. No fatigue, no heart palpitations.  4. Hyperlipidemia with target LDL less than 100 Does not really watch diet. Does no dedicated exercise. Lab Results  Component Value Date   CHOL 138 07/12/2023   HDL 46 07/12/2023   LDLCALC 74 07/12/2023   TRIG 96 07/12/2023   CHOLHDL 3.0 07/12/2023     5. Type 2 diabetes mellitus without complication, without long-term current use of insulin (HCC) He does not check his blood sugars at home.not very compliant with diet. Lab Results  Component Value Date   HGBA1C 5.6 07/12/2023     6. H/O prostate cancer No voiding issues  7. BMI 27.0-27.9,adult No recent weight changes  Wt Readings from Last 3 Encounters:  10/17/23 177 lb (80.3 kg)  07/12/23 177 lb (80.3 kg)  04/01/23 176 lb (79.8 kg)   BMI Readings from Last 3 Encounters:  10/17/23 25.40 kg/m  07/12/23 25.40 kg/m  04/01/23 25.25 kg/m       New complaints: None today  Allergies  Allergen Reactions   Penicillins    Zocor [Simvastatin]    Outpatient Encounter Medications as of 10/17/2023  Medication Sig   aspirin 325 MG EC tablet Take 81 mg by mouth daily. (Patient not taking: Reported on 09/19/2023)   aspirin EC 81 MG tablet Take 81 mg by  mouth daily. Swallow whole.   glipiZIDE  (GLUCOTROL ) 5 MG tablet TAKE 1 TABLET (5 MG TOTAL) BY MOUTH TWICE A DAY BEFORE MEALS   metoprolol  succinate (TOPROL -XL) 50 MG 24 hr tablet Take 1 tablet (50 mg total) by mouth daily. Take with or immediately following a meal.   pantoprazole  (PROTONIX ) 40 MG tablet Take 1 tablet (40 mg total) by mouth daily.   rosuvastatin  (CRESTOR ) 10 MG tablet Take 1 tablet (10 mg total) by mouth daily.   sitaGLIPtin -metformin  (JANUMET ) 50-500 MG tablet Take 1 tablet by mouth 2 (two) times daily with a meal.   No facility-administered encounter medications on file as of 10/17/2023.    Past Surgical History:  Procedure Laterality Date   CARDIAC CATHETERIZATION     COLON SURGERY     hemrrhoids   CORONARY ANGIOPLASTY WITH STENT PLACEMENT      Family History  Problem Relation Age of Onset   Diabetes Mother    Stroke Mother    Cancer Father        skin, prostate, stomach   Healthy Daughter    Healthy Son    Asthma Son       Controlled substance contract: n/a     Review of Systems  Constitutional:  Negative for diaphoresis.  Eyes:  Negative for pain.  Respiratory:  Negative for shortness  of breath.   Cardiovascular:  Negative for chest pain, palpitations and leg swelling.  Gastrointestinal:  Negative for abdominal pain.  Endocrine: Negative for polydipsia.  Skin:  Negative for rash.  Neurological:  Negative for dizziness, weakness and headaches.  Hematological:  Does not bruise/bleed easily.  All other systems reviewed and are negative.      Objective:   Physical Exam Vitals and nursing note reviewed.  Constitutional:      Appearance: Normal appearance. He is well-developed.  HENT:     Head: Normocephalic.     Nose: Nose normal.     Mouth/Throat:     Mouth: Mucous membranes are moist.     Pharynx: Oropharynx is clear.  Eyes:     Pupils: Pupils are equal, round, and reactive to light.  Neck:     Thyroid: No thyroid mass or thyromegaly.      Vascular: No carotid bruit or JVD.     Trachea: Phonation normal.  Cardiovascular:     Rate and Rhythm: Normal rate and regular rhythm.  Pulmonary:     Effort: Pulmonary effort is normal. No respiratory distress.     Breath sounds: Normal breath sounds.  Abdominal:     General: Bowel sounds are normal.     Palpations: Abdomen is soft.     Tenderness: There is no abdominal tenderness.  Musculoskeletal:        General: Normal range of motion.     Cervical back: Normal range of motion and neck supple.  Lymphadenopathy:     Cervical: No cervical adenopathy.  Skin:    General: Skin is warm and dry.  Neurological:     Mental Status: He is alert and oriented to person, place, and time.  Psychiatric:        Behavior: Behavior normal.        Thought Content: Thought content normal.        Judgment: Judgment normal.    BP 116/71   Pulse 70   Temp 98.1 F (36.7 C) (Temporal)   Ht 5\' 10"  (1.778 m)   Wt 177 lb (80.3 kg)   SpO2 98%   BMI 25.40 kg/m   EKG- NSR-Mary-Margaret Gaylyn Keas, FNP  Chest xray- normal-Preliminary reading by Irvine Mantis, FNP  [id number] [facility]   [facility] 6.0%       Assessment & Plan:  [name] comes in today with chief complaint of annual physical  Diagnosis and orders addressed:  1. Annual physical exam   2. Essential hypertension, benign Low sodium diet - CBC with Differential/Platelet - CMP14+EGFR  3. Coronary artery disease due to lipid rich plaque  4. Hyperlipidemia with target LDL less than 100 Low fat diet - Lipid panel  5. Type 2 diabetes mellitus without complication, without long-term current use of insulin (HCC) Strict low carb diet Hold janumet  - Bayer DCA Hb A1c Waived - Ambulatory referral to Podiatry - glipiZIDE  (GLUCOTROL ) 5 MG tablet; Take 1 tablet (5 mg total) by mouth 2 (two) times daily before a meal.  Dispense: 60 tablet; Refill: 3 Metformin  1000 BID #180 1 refill  7. BMI 27.0-27.9,adult Discussed diet and exercise for  person with BMI >25 Will recheck weight in 3-6 months    Labs pending Health Maintenance reviewed Diet and exercise encouraged  Follow up plan: 1 month   Mary-Margaret Gaylyn Keas, FNP

## 2023-11-03 NOTE — Progress Notes (Signed)
 [facility] - Clinic Note  11/15/2023     CHIEF COMPLAINT Patient presents for Retina Follow Up   HISTORY OF PRESENT ILLNESS: Arthur Torres is a 73 y.o. male who presents to the clinic today for:   HPI     Retina Follow Up   Patient presents with  Diabetic Retinopathy.  In both eyes.  This started 4 weeks ago.  Duration of 4 weeks.  Since onset it is stable.  I, the attending physician,  performed the HPI with the patient and updated documentation appropriately.        Comments   Patient feels the vision is about the same. He is not using eye drops. He does not check his blood sugar.       Last edited by Ronelle Coffee, MD on 11/15/2023  5:13 PM.    Pt states vision is stable   Referring physician: Delfina Feller, FNP [address] [place],  [place] [address]  HISTORICAL INFORMATION:   Selected notes from the MEDICAL RECORD NUMBER Referred by Dr. Reford Canterbury for DM exam LEE:  Ocular Hx- PMH-    CURRENT MEDICATIONS: No current outpatient medications on file. (Ophthalmic Drugs)   No current facility-administered medications for this visit. (Ophthalmic Drugs)   Current Outpatient Medications (Other)  Medication Sig   aspirin EC 81 MG tablet Take 81 mg by mouth daily. Swallow whole.   glipiZIDE  (GLUCOTROL ) 5 MG tablet TAKE 1 TABLET (5 MG TOTAL) BY MOUTH TWICE A DAY BEFORE MEALS   metFORMIN  (GLUCOPHAGE ) 1000 MG tablet Take 1 tablet (1,000 mg total) by mouth 2 (two) times daily with a meal.   metoprolol  succinate (TOPROL -XL) 50 MG 24 hr tablet Take 1 tablet (50 mg total) by mouth daily. Take with or immediately following a meal.   pantoprazole  (PROTONIX ) 40 MG tablet Take 1 tablet (40 mg total) by mouth daily.   rosuvastatin  (CRESTOR ) 10 MG tablet Take 1 tablet (10 mg total) by mouth daily.   No current facility-administered medications for this visit. (Other)   REVIEW OF SYSTEMS: ROS   Positive for: Endocrine, Eyes Negative for:  Constitutional, Gastrointestinal, Neurological, Skin, Genitourinary, Musculoskeletal, HENT, Cardiovascular, Respiratory, Psychiatric, Allergic/Imm, Heme/Lymph Last edited by Olene Berne, COT on 11/15/2023 12:38 PM.       ALLERGIES Allergies  Allergen Reactions   Penicillins    Zocor [Simvastatin]    PAST MEDICAL HISTORY Past Medical History:  Diagnosis Date   CAD (coronary artery disease)    1999 occluded LAD treated with PCI and stenting, 80% stenosis of the first diagonal feeling angioplasty, 60-70% second stenosis, 50% ostial left main stenosis, 25% right coronary artery stenosis, 50% ostial PDA stenosis.   Cancer [facility])    prostate (treated with radiation)   COVID-19    Diabetes mellitus without complication (HCC)    Hyperlipidemia    Hypertension    Joint pain    Past Surgical History:  Procedure Laterality Date   CARDIAC CATHETERIZATION     COLON SURGERY     hemrrhoids   CORONARY ANGIOPLASTY WITH STENT PLACEMENT     FAMILY HISTORY Family History  Problem Relation Age of Onset   Diabetes Mother    Stroke Mother    Cancer Father        skin, prostate, stomach   Healthy Daughter    Healthy Son    Asthma Son    SOCIAL HISTORY Social History   Tobacco Use   Smoking status: Former  Current packs/day: 0.00    Types: Cigarettes    Quit date: 03/28/1998    Years since quitting: 25.6   Smokeless tobacco: Never  Vaping Use   Vaping status: Never Used  Substance Use Topics   Alcohol use: No   Drug use: No       OPHTHALMIC EXAM:  Base Eye Exam     Visual Acuity (Snellen - Linear)       Right Left   Dist cc 20/40 20/20   Dist ph cc NI     Correction: Glasses         Tonometry (Tonopen, 12:42 PM)       Right Left   Pressure 12 13         Pupils       Dark Light Shape React APD   Right 3 2 Round Brisk None   Left 3 2 Round Brisk None         Visual Fields       Left Right    Full          Extraocular Movement        Right Left    Full, Ortho Full, Ortho         Neuro/Psych     Oriented x3: Yes   Mood/Affect: Normal         Dilation     Both eyes: 1.0% Mydriacyl, 2.5% Phenylephrine @ 12:39 PM           Slit Lamp and Fundus Exam     Slit Lamp Exam       Right Left   Lids/Lashes Dermatochalasis - upper lid, Meibomian gland dysfunction Dermatochalasis - upper lid, Meibomian gland dysfunction   Conjunctiva/Sclera White and quiet nasal and temporal pinguecula   Cornea arcus Trace tear film debris   Anterior Chamber deep and clear deep and clear   Iris Round and dilated, No NVI Round and dilated, No NVI   Lens 2+ Nuclear sclerosis with brunescence, 2+ Cortical cataract 2+ Nuclear sclerosis with brunescence, 2+ Cortical cataract   Anterior Vitreous mild syneresis mild syneresis         Fundus Exam       Right Left   Disc Pink and Sharp, +PPP Pink and Sharp, mild PPP   C/D Ratio 0.2 0.2   Macula Blunted foveal reflex, central edema / cystic changes, scattered MA/DBH Flat, Blunted foveal reflex, scattered MA, trace cystic changes temporal fovea   Vessels attenuated, Tortuous attenuated, Tortuous   Periphery Attached, 360 MA / DBH -- improved Attached, rare MA           Refraction     Wearing Rx       Sphere Cylinder Axis Add   Right -1.50 +0.75 114 +2.50   Left -1.50 +0.25 163 +2.50            IMAGING AND PROCEDURES  Imaging and Procedures for 11/15/2023  OCT, Retina - OU - Both Eyes        Right Eye Quality was good. Central Foveal Thickness: 338. Progression has been stable. Findings include no SRF, abnormal foveal contour, intraretinal hyper-reflective material, intraretinal fluid, vitreomacular adhesion (Persistent central IRF/IRHM ).   Left Eye Quality was good. Central Foveal Thickness: 316. Progression has worsened. Findings include normal foveal contour, no SRF, intraretinal hyper-reflective material, intraretinal fluid (Trace persistent focal cystic  changes temporal fovea and mac -- increased).   Notes  *Images captured and stored on drive  Diagnosis /  Impression:  OD: +DME/CME - Persistent central IRF/IRHM  OS: Trace persistent focal cystic changes temporal fovea and mac -- increased  Clinical management:  See below  Abbreviations: NFP - Normal foveal profile. CME - cystoid macular edema. PED - pigment epithelial detachment. IRF - intraretinal fluid. SRF - subretinal fluid. EZ - ellipsoid zone. ERM - epiretinal membrane. ORA - outer retinal atrophy. ORT - outer retinal tubulation. SRHM - subretinal hyper-reflective material. IRHM - intraretinal hyper-reflective material      Intravitreal Injection, Pharmacologic Agent - OD - Right Eye       Time Out 11/15/2023. 1:22 PM. Confirmed correct patient, procedure, site, and patient consented.   Anesthesia Topical anesthesia was used. Anesthetic medications included Lidocaine 2%, Proparacaine 0.5%.   Procedure Preparation included 5% betadine to ocular surface, eyelid speculum. A supplied (32g) needle was used.   Injection: 1.25 mg Bevacizumab  1.25mg /0.22ml   Route: Intravitreal, Site: Right Eye   NDC: [id number], Lot: [id number], Expiration date: 12/16/2023   Post-op Post injection exam found visual acuity of at least counting fingers. The patient tolerated the procedure well. There were no complications. The patient received written and verbal post procedure care education. Post injection medications were not given.            ASSESSMENT/PLAN:    ICD-10-CM   1. Moderate nonproliferative diabetic retinopathy of both eyes with macular edema associated with type 2 diabetes mellitus (HCC)  E11.3313 OCT, Retina - OU - Both Eyes    Intravitreal Injection, Pharmacologic Agent - OD - Right Eye    Bevacizumab  (AVASTIN ) SOLN 1.25 mg    2. Long term (current) use of oral hypoglycemic drugs  Z79.84     3. Essential hypertension  I10     4. Hypertensive retinopathy of both eyes   H35.033     5. Combined forms of age-related cataract of both eyes  H25.813      1,2. Moderate Non-proliferative diabetic retinopathy, both eyes (OD>OS)  - A1c 5.6 (01.14.25) - exam shows scattered MA OU (OD > OS) - s/p IVA OD #1 (04.08.24), #2 (05.06.24), #3 (06.03.24), #4 (07.01.24), #5 (07.29.24), #6 (08.26.24), #7 (01.27.25), #8 (02.24.25), #9 (03.24.25), #10 (04.21.25) - s/p IVE OD #1 (09.27.24), #2 (10.28.24), #3 (11.25.24), #4 (12.30.24) - FA (04.08.24) shows OD: scattered leaking MA greatest centrally; no NV; Large area of vascular non-perfusion temporal periphery with +perivascular leakage; OS: Scattered MA with late leakage, no NV - BCVA OD stable at 20/40, OS 20/20 -- stable OU - OCT shows OD: +DME/CME - Persistent central IRF/IRHM; OS: Trace persistent focal cystic changes temporal fovea and mac -- increased at 4 weeks - recommend IVA OD #11 today, 05.20.25 for DME w/ f/u in 4 wks - pt wishes to proceed - RBA of procedure discussed, questions answered - IVA informed consent obtained and signed, 01.27.25 (OD) - see procedure note - Eylea  approved for 2025 -- but [employer] funding unavailable - f/u in 4 wks -- DFE/OCT, possible injection  3,4. Hypertensive retinopathy OU - discussed importance of tight BP control - possible RVO/CME component to macular edema OD - monitor  5. Mixed Cataract OU - The symptoms of cataract, surgical options, and treatments and risks were discussed with patient. - discussed diagnosis and progression - referred to [facility] -- [facility] for consult -- pt is deferring consult for now  Ophthalmic Meds Ordered this visit:  Meds ordered this encounter  Medications   Bevacizumab  (AVASTIN ) SOLN 1.25 mg     Return  in about 4 weeks (around 12/13/2023) for f/u NPDR OU, DFE, OCT, Possible Injxn.  There are no Patient Instructions on file for this visit.  Explained the diagnoses, plan, and follow up with the patient and they expressed  understanding.  Patient expressed understanding of the importance of proper follow up care.   This document serves as a record of services personally performed by Jeanice Millard, MD, PhD. It was created on their behalf by Olene Berne, COT an ophthalmic technician. The creation of this record is the provider's dictation and/or activities during the visit.    Electronically signed by:  Olene Berne, COT  11/20/23 12:50 AM  This document serves as a record of services personally performed by Jeanice Millard, MD, PhD. It was created on their behalf by Morley Arabia. Bevin Bucks, OA an ophthalmic technician. The creation of this record is the provider's dictation and/or activities during the visit.    Electronically signed by: Morley Arabia. Bevin Bucks, OA 11/20/23 12:50 AM  Jeanice Millard, M.D., Ph.D. Diseases & Surgery of the Retina and Vitreous [facility]  I have reviewed the above documentation for accuracy and completeness, and I agree with the above. Jeanice Millard, M.D., Ph.D. 11/20/23 12:52 AM   Abbreviations: M myopia (nearsighted); A astigmatism; H hyperopia (farsighted); P presbyopia; Mrx spectacle prescription;  CTL contact lenses; OD right eye; OS left eye; OU both eyes  XT exotropia; ET esotropia; PEK punctate epithelial keratitis; PEE punctate epithelial erosions; DES dry eye syndrome; MGD meibomian gland dysfunction; ATs artificial tears; PFAT's preservative free artificial tears; NSC nuclear sclerotic cataract; PSC posterior subcapsular cataract; ERM epi-retinal membrane; PVD posterior vitreous detachment; RD retinal detachment; DM diabetes mellitus; DR diabetic retinopathy; NPDR non-proliferative diabetic retinopathy; PDR proliferative diabetic retinopathy; CSME clinically significant macular edema; DME diabetic macular edema; dbh dot blot hemorrhages; CWS cotton wool spot; POAG primary open angle glaucoma; C/D cup-to-disc ratio; HVF humphrey visual field; GVF  goldmann visual field; OCT optical coherence tomography; IOP intraocular pressure; BRVO Branch retinal vein occlusion; CRVO central retinal vein occlusion; CRAO central retinal artery occlusion; BRAO branch retinal artery occlusion; RT retinal tear; SB scleral buckle; PPV pars plana vitrectomy; VH Vitreous hemorrhage; PRP panretinal laser photocoagulation; IVK intravitreal kenalog; VMT vitreomacular traction; MH Macular hole;  NVD neovascularization of the disc; NVE neovascularization elsewhere; AREDS age related eye disease study; ARMD age related macular degeneration; POAG primary open angle glaucoma; EBMD epithelial/anterior basement membrane dystrophy; ACIOL anterior chamber intraocular lens; IOL intraocular lens; PCIOL posterior chamber intraocular lens; Phaco/IOL phacoemulsification with intraocular lens placement; PRK photorefractive keratectomy; LASIK laser assisted in situ keratomileusis; HTN hypertension; DM diabetes mellitus; COPD chronic obstructive pulmonary disease

## 2023-11-15 ENCOUNTER — Ambulatory Visit (INDEPENDENT_AMBULATORY_CARE_PROVIDER_SITE_OTHER): Admitting: Ophthalmology

## 2023-11-15 ENCOUNTER — Encounter (INDEPENDENT_AMBULATORY_CARE_PROVIDER_SITE_OTHER): Payer: Self-pay | Admitting: Ophthalmology

## 2023-11-15 DIAGNOSIS — H25813 Combined forms of age-related cataract, bilateral: Secondary | ICD-10-CM | POA: Diagnosis not present

## 2023-11-15 DIAGNOSIS — Z7984 Long term (current) use of oral hypoglycemic drugs: Secondary | ICD-10-CM

## 2023-11-15 DIAGNOSIS — H35033 Hypertensive retinopathy, bilateral: Secondary | ICD-10-CM | POA: Diagnosis not present

## 2023-11-15 DIAGNOSIS — E113313 Type 2 diabetes mellitus with moderate nonproliferative diabetic retinopathy with macular edema, bilateral: Secondary | ICD-10-CM | POA: Diagnosis not present

## 2023-11-15 DIAGNOSIS — I10 Essential (primary) hypertension: Secondary | ICD-10-CM

## 2023-11-15 MED ORDER — BEVACIZUMAB CHEMO INJECTION 1.25MG/0.05ML SYRINGE FOR KALEIDOSCOPE
1.2500 mg | INTRAVITREAL | Status: AC | PRN
  Administered 2023-11-15: 1.25 mg via INTRAVITREAL

## 2023-12-06 NOTE — Progress Notes (Shared)
 [facility] - Clinic Note  12/13/2023     CHIEF COMPLAINT Patient presents for No chief complaint on file.   HISTORY OF PRESENT ILLNESS: Arthur Torres is a 73 y.o. male who presents to the clinic today for:      Referring physician: Delfina Feller, FNP [address] [place],  [place] [address]  HISTORICAL INFORMATION:   Selected notes from the MEDICAL RECORD NUMBER Referred by Dr. Reford Canterbury for DM exam LEE:  Ocular Hx- PMH-    CURRENT MEDICATIONS: No current outpatient medications on file. (Ophthalmic Drugs)   No current facility-administered medications for this visit. (Ophthalmic Drugs)   Current Outpatient Medications (Other)  Medication Sig   aspirin EC 81 MG tablet Take 81 mg by mouth daily. Swallow whole.   glipiZIDE  (GLUCOTROL ) 5 MG tablet TAKE 1 TABLET (5 MG TOTAL) BY MOUTH TWICE A DAY BEFORE MEALS   metFORMIN  (GLUCOPHAGE ) 1000 MG tablet Take 1 tablet (1,000 mg total) by mouth 2 (two) times daily with a meal.   metoprolol  succinate (TOPROL -XL) 50 MG 24 hr tablet Take 1 tablet (50 mg total) by mouth daily. Take with or immediately following a meal.   pantoprazole  (PROTONIX ) 40 MG tablet Take 1 tablet (40 mg total) by mouth daily.   rosuvastatin  (CRESTOR ) 10 MG tablet Take 1 tablet (10 mg total) by mouth daily.   No current facility-administered medications for this visit. (Other)   REVIEW OF SYSTEMS:     ALLERGIES Allergies  Allergen Reactions   Penicillins    Zocor [Simvastatin]    PAST MEDICAL HISTORY Past Medical History:  Diagnosis Date   CAD (coronary artery disease)    1999 occluded LAD treated with PCI and stenting, 80% stenosis of the first diagonal feeling angioplasty, 60-70% second stenosis, 50% ostial left main stenosis, 25% right coronary artery stenosis, 50% ostial PDA stenosis.   Cancer (HCC)    prostate (treated with radiation)   COVID-19    Diabetes mellitus without complication (HCC)    Hyperlipidemia     Hypertension    Joint pain    Past Surgical History:  Procedure Laterality Date   CARDIAC CATHETERIZATION     COLON SURGERY     hemrrhoids   CORONARY ANGIOPLASTY WITH STENT PLACEMENT     FAMILY HISTORY Family History  Problem Relation Age of Onset   Diabetes Mother    Stroke Mother    Cancer Father        skin, prostate, stomach   Healthy Daughter    Healthy Son    Asthma Son    SOCIAL HISTORY Social History   Tobacco Use   Smoking status: Former    Current packs/day: 0.00    Types: Cigarettes    Quit date: 03/28/1998    Years since quitting: 25.7   Smokeless tobacco: Never  Vaping Use   Vaping status: Never Used  Substance Use Topics   Alcohol use: No   Drug use: No       OPHTHALMIC EXAM:  Not recorded     IMAGING AND PROCEDURES  Imaging and Procedures for 12/13/2023          ASSESSMENT/PLAN:  No diagnosis found.  1,2. Moderate Non-proliferative diabetic retinopathy, both eyes (OD>OS)  - A1c 5.6 (01.14.25) - exam shows scattered MA OU (OD > OS) - s/p IVA OD #1 (04.08.24), #2 (05.06.24), #3 (06.03.24), #4 (07.01.24), #5 (07.29.24), #6 (08.26.24), #7 (01.27.25), #8 (02.24.25), #9 (03.24.25), #10 (04.21.25), #11 (05.20.25) - s/p  IVE OD #1 (09.27.24), #2 (10.28.24), #3 (11.25.24), #4 (12.30.24) - FA (04.08.24) shows OD: scattered leaking MA greatest centrally; no NV; Large area of vascular non-perfusion temporal periphery with +perivascular leakage; OS: Scattered MA with late leakage, no NV - BCVA OD stable at 20/40, OS 20/20 -- stable OU - OCT shows OD: +DME/CME - Persistent central IRF/IRHM--slightly improved; OS: Trace persistent focal cystic changes temporal fovea and mac -- slightly increased at 4 weeks - recommend IVA OD #12 today, 06.17.25 for DME w/ f/u in 4 wks - pt wishes to proceed - RBA of procedure discussed, questions answered - IVA informed consent obtained and signed, 01.27.25 (OD) - see procedure note - Eylea  approved for 2025 --  but [employer] funding unavailable - f/u in 4 wks -- DFE/OCT, possible injection  3,4. Hypertensive retinopathy OU - discussed importance of tight BP control - possible RVO/CME component to macular edema OD - monitor  5. Mixed Cataract OU - The symptoms of cataract, surgical options, and treatments and risks were discussed with patient. - discussed diagnosis and progression - referred to [facility] [place] for consult -- pt is deferring consult for now  Ophthalmic Meds Ordered this visit:  No orders of the defined types were placed in this encounter.    No follow-ups on file.  There are no Patient Instructions on file for this visit.  Explained the diagnoses, plan, and follow up with the patient and they expressed understanding.  Patient expressed understanding of the importance of proper follow up care.   This document serves as a record of services personally performed by Jeanice Millard, MD, PhD. It was created on their behalf by Angelia Kelp, an ophthalmic technician. The creation of this record is the provider's dictation and/or activities during the visit.    Electronically signed by: Angelia Kelp, OA, 12/06/23  12:45 PM   Jeanice Millard, M.D., Ph.D. Diseases & Surgery of the Retina and Vitreous [facility]   Abbreviations: M myopia (nearsighted); A astigmatism; H hyperopia (farsighted); P presbyopia; Mrx spectacle prescription;  CTL contact lenses; OD right eye; OS left eye; OU both eyes  XT exotropia; ET esotropia; PEK punctate epithelial keratitis; PEE punctate epithelial erosions; DES dry eye syndrome; MGD meibomian gland dysfunction; ATs artificial tears; PFAT's preservative free artificial tears; NSC nuclear sclerotic cataract; PSC posterior subcapsular cataract; ERM epi-retinal membrane; PVD posterior vitreous detachment; RD retinal detachment; DM diabetes mellitus; DR diabetic retinopathy; NPDR non-proliferative diabetic  retinopathy; PDR proliferative diabetic retinopathy; CSME clinically significant macular edema; DME diabetic macular edema; dbh dot blot hemorrhages; CWS cotton wool spot; POAG primary open angle glaucoma; C/D cup-to-disc ratio; HVF humphrey visual field; GVF goldmann visual field; OCT optical coherence tomography; IOP intraocular pressure; BRVO Branch retinal vein occlusion; CRVO central retinal vein occlusion; CRAO central retinal artery occlusion; BRAO branch retinal artery occlusion; RT retinal tear; SB scleral buckle; PPV pars plana vitrectomy; VH Vitreous hemorrhage; PRP panretinal laser photocoagulation; IVK intravitreal kenalog; VMT vitreomacular traction; MH Macular hole;  NVD neovascularization of the disc; NVE neovascularization elsewhere; AREDS age related eye disease study; ARMD age related macular degeneration; POAG primary open angle glaucoma; EBMD epithelial/anterior basement membrane dystrophy; ACIOL anterior chamber intraocular lens; IOL intraocular lens; PCIOL posterior chamber intraocular lens; Phaco/IOL phacoemulsification with intraocular lens placement; PRK photorefractive keratectomy; LASIK laser assisted in situ keratomileusis; HTN hypertension; DM diabetes mellitus; COPD chronic obstructive pulmonary disease

## 2023-12-13 ENCOUNTER — Encounter (INDEPENDENT_AMBULATORY_CARE_PROVIDER_SITE_OTHER): Payer: Self-pay | Admitting: Ophthalmology

## 2023-12-13 ENCOUNTER — Ambulatory Visit (INDEPENDENT_AMBULATORY_CARE_PROVIDER_SITE_OTHER): Admitting: Ophthalmology

## 2023-12-13 DIAGNOSIS — I10 Essential (primary) hypertension: Secondary | ICD-10-CM | POA: Diagnosis not present

## 2023-12-13 DIAGNOSIS — E113313 Type 2 diabetes mellitus with moderate nonproliferative diabetic retinopathy with macular edema, bilateral: Secondary | ICD-10-CM | POA: Diagnosis not present

## 2023-12-13 DIAGNOSIS — H35033 Hypertensive retinopathy, bilateral: Secondary | ICD-10-CM | POA: Diagnosis not present

## 2023-12-13 DIAGNOSIS — Z7984 Long term (current) use of oral hypoglycemic drugs: Secondary | ICD-10-CM

## 2023-12-13 DIAGNOSIS — H25813 Combined forms of age-related cataract, bilateral: Secondary | ICD-10-CM | POA: Diagnosis not present

## 2023-12-13 MED ORDER — BEVACIZUMAB CHEMO INJECTION 1.25MG/0.05ML SYRINGE FOR KALEIDOSCOPE
1.2500 mg | INTRAVITREAL | Status: AC | PRN
Start: 2023-12-13 — End: 2023-12-13
  Administered 2023-12-13: 1.25 mg via INTRAVITREAL

## 2023-12-21 ENCOUNTER — Encounter: Payer: Self-pay | Admitting: Nurse Practitioner

## 2023-12-21 ENCOUNTER — Encounter (HOSPITAL_COMMUNITY): Payer: Self-pay

## 2023-12-21 ENCOUNTER — Ambulatory Visit: Payer: Self-pay | Admitting: Nurse Practitioner

## 2023-12-21 ENCOUNTER — Ambulatory Visit: Admitting: Nurse Practitioner

## 2023-12-21 ENCOUNTER — Other Ambulatory Visit: Payer: Self-pay

## 2023-12-21 ENCOUNTER — Emergency Department (HOSPITAL_COMMUNITY)
Admission: EM | Admit: 2023-12-21 | Discharge: 2023-12-21 | Disposition: A | Discharge status: Home / Self Care | Attending: Emergency Medicine | Admitting: Emergency Medicine

## 2023-12-21 ENCOUNTER — Emergency Department (HOSPITAL_COMMUNITY)

## 2023-12-21 VITALS — BP 130/75 | HR 70 | Temp 98.6°F | Resp 18 | Ht 70.0 in | Wt 178.6 lb

## 2023-12-21 DIAGNOSIS — Z8616 Personal history of COVID-19: Secondary | ICD-10-CM | POA: Diagnosis not present

## 2023-12-21 DIAGNOSIS — R002 Palpitations: Secondary | ICD-10-CM | POA: Diagnosis not present

## 2023-12-21 DIAGNOSIS — I2510 Atherosclerotic heart disease of native coronary artery without angina pectoris: Secondary | ICD-10-CM | POA: Diagnosis not present

## 2023-12-21 DIAGNOSIS — E119 Type 2 diabetes mellitus without complications: Secondary | ICD-10-CM | POA: Insufficient documentation

## 2023-12-21 DIAGNOSIS — Z79899 Other long term (current) drug therapy: Secondary | ICD-10-CM | POA: Insufficient documentation

## 2023-12-21 DIAGNOSIS — Z955 Presence of coronary angioplasty implant and graft: Secondary | ICD-10-CM | POA: Diagnosis not present

## 2023-12-21 DIAGNOSIS — Z7982 Long term (current) use of aspirin: Secondary | ICD-10-CM | POA: Insufficient documentation

## 2023-12-21 DIAGNOSIS — I10 Essential (primary) hypertension: Secondary | ICD-10-CM | POA: Diagnosis not present

## 2023-12-21 DIAGNOSIS — Z7984 Long term (current) use of oral hypoglycemic drugs: Secondary | ICD-10-CM | POA: Diagnosis not present

## 2023-12-21 DIAGNOSIS — Z8546 Personal history of malignant neoplasm of prostate: Secondary | ICD-10-CM | POA: Insufficient documentation

## 2023-12-21 DIAGNOSIS — R079 Chest pain, unspecified: Secondary | ICD-10-CM | POA: Diagnosis not present

## 2023-12-21 HISTORY — DX: Old myocardial infarction: I25.2

## 2023-12-21 LAB — CBC WITH DIFFERENTIAL/PLATELET
Abs Immature Granulocytes: 0.01 10*3/uL (ref 0.00–0.07)
Basophils Absolute: 0.1 10*3/uL (ref 0.0–0.1)
Basophils Relative: 1 %
Eosinophils Absolute: 0.3 10*3/uL (ref 0.0–0.5)
Eosinophils Relative: 5 %
HCT: 43.2 % (ref 39.0–52.0)
Hemoglobin: 14.1 g/dL (ref 13.0–17.0)
Immature Granulocytes: 0 %
Lymphocytes Relative: 27 %
Lymphs Abs: 1.7 10*3/uL (ref 0.7–4.0)
MCH: 28.7 pg (ref 26.0–34.0)
MCHC: 32.6 g/dL (ref 30.0–36.0)
MCV: 87.8 fL (ref 80.0–100.0)
Monocytes Absolute: 0.6 10*3/uL (ref 0.1–1.0)
Monocytes Relative: 9 %
Neutro Abs: 3.6 10*3/uL (ref 1.7–7.7)
Neutrophils Relative %: 58 %
Platelets: 141 10*3/uL — ABNORMAL LOW (ref 150–400)
RBC: 4.92 MIL/uL (ref 4.22–5.81)
RDW: 13.3 % (ref 11.5–15.5)
WBC: 6.2 10*3/uL (ref 4.0–10.5)
nRBC: 0 % (ref 0.0–0.2)

## 2023-12-21 LAB — COMPREHENSIVE METABOLIC PANEL WITH GFR
ALT: 26 U/L (ref 0–44)
AST: 20 U/L (ref 15–41)
Albumin: 4 g/dL (ref 3.5–5.0)
Alkaline Phosphatase: 54 U/L (ref 38–126)
Anion gap: 12 (ref 5–15)
BUN: 24 mg/dL — ABNORMAL HIGH (ref 8–23)
CO2: 24 mmol/L (ref 22–32)
Calcium: 9.6 mg/dL (ref 8.9–10.3)
Chloride: 101 mmol/L (ref 98–111)
Creatinine, Ser: 1.02 mg/dL (ref 0.61–1.24)
GFR, Estimated: >60 mL/min (ref >60)
Glucose, Bld: 144 mg/dL — ABNORMAL HIGH (ref 70–99)
Potassium: 3.8 mmol/L (ref 3.5–5.1)
Sodium: 137 mmol/L (ref 135–145)
Total Bilirubin: 0.3 mg/dL (ref 0.0–1.2)
Total Protein: 6.8 g/dL (ref 6.5–8.1)

## 2023-12-21 LAB — CBG MONITORING, ED: Glucose-Capillary: 126 mg/dL — ABNORMAL HIGH (ref 70–99)

## 2023-12-21 LAB — TROPONIN I (HIGH SENSITIVITY): Troponin I (High Sensitivity): 3 ng/L (ref <18)

## 2023-12-21 NOTE — Discharge Instructions (Addendum)
 You were evaluated in the emergency room for palpitations.  Your lab work and imaging did not show any significant abnormality.  You are provided a referral for cardiology.  If you experience any new or worsening symptoms please return to emergency room.

## 2023-12-21 NOTE — ED Triage Notes (Signed)
 Pt arrived via POV from [facility] PCP Office and reports he was sent to [facility] for further evaluation of Pt reporting a fluttering, palpitation sensation in his chest since yesterday. Pt reports he may also be dehydrated. Pt reports Hx of cardiac problems.

## 2023-12-21 NOTE — Progress Notes (Signed)
 Acute Office Visit  Subjective:     Patient ID: Arthur Torres, male    DOB: 1951/05/12, 73 y.o.   MRN: [id number]  Chief Complaint  Patient presents with   Chest Pain    Patient states that he feels a flatter in the mid chest area. He felt it some yesterday and all day today.     HPI Arthur Torres is a 73 year old male who presents on 12/21/2023 for evaluation of intermittent palpitations described as a "fluttering" sensation in the chest. The patient reports that the episodes began earlier this morning around 9:00 AM and have been coming and going since.  He denies associated symptoms such as fatigue, dizziness, lightheadedness, shortness of breath, chest pain, or syncope. He also denies any recent physical exertion or emotional stress.  The patient has a past medical history significant for type 2 diabetes mellitus, hypertension, coronary artery disease with stent placement, and hyperlipidemia. He has not been evaluated by a cardiologist to date.  He notes limited fluid intake today, stating, "I haven't had any water, only soft drinks and coffee."  An EKG performed today shows no new changes compared to his last EKG in March 2025.  Active Ambulatory Problems    Diagnosis Date Noted   Hyperlipidemia associated with type 2 diabetes mellitus (HCC) 10/03/2012   Essential hypertension, benign 10/03/2012   Diabetes mellitus treated with oral medication (HCC) 10/03/2012   History of right coronary artery stent placement 10/06/2012   H/O prostate cancer 10/04/2014   BMI 27.0-27.9,adult 04/17/2015   Allergic rhinitis due to pollen 10/22/2015   Gastroesophageal reflux disease without esophagitis 12/31/2022   Fluttering sensation of heart 12/21/2023   Resolved Ambulatory Problems    Diagnosis Date Noted   No Resolved Ambulatory Problems   Past Medical History:  Diagnosis Date   CAD (coronary artery disease)    Cancer (HCC)    COVID-19    Diabetes mellitus without complication  (HCC)    Hyperlipidemia    Hypertension    Joint pain     Review of Systems  Constitutional:  Negative for chills and fever.  Respiratory:  Negative for cough and shortness of breath.   Cardiovascular:  Positive for palpitations. Negative for chest pain and leg swelling.  Gastrointestinal:  Negative for nausea and vomiting.  Musculoskeletal:  Negative for falls.  Skin:  Negative for itching and rash.  Neurological:  Negative for dizziness and headaches.   Negative unless indicated in HPI    Objective:    BP 130/75   Pulse 70   Temp 98.6 F (37 C) (Oral)   Resp 18   Ht 5' 10 (1.778 m)   Wt 178 lb 9.6 oz (81 kg)   SpO2 96%   BMI 25.63 kg/m  BP Readings from Last 3 Encounters:  12/21/23 130/75  10/17/23 116/71  07/12/23 (!) 141/74   Wt Readings from Last 3 Encounters:  12/21/23 178 lb 9.6 oz (81 kg)  10/17/23 177 lb (80.3 kg)  07/12/23 177 lb (80.3 kg)      Physical Exam Vitals and nursing note reviewed.  Constitutional:      General: He is not in acute distress. HENT:     Head: Normocephalic and atraumatic.     Right Ear: Tympanic membrane, ear canal and external ear normal. There is no impacted cerumen.     Left Ear: Tympanic membrane and external ear normal. There is no impacted cerumen.     Nose: Nose normal.  Eyes:     General: No scleral icterus.    Extraocular Movements: Extraocular movements intact.     Conjunctiva/sclera: Conjunctivae normal.     Pupils: Pupils are equal, round, and reactive to light.    Cardiovascular:     Rate and Rhythm: Normal rate and regular rhythm.  Pulmonary:     Effort: Pulmonary effort is normal.     Breath sounds: Normal breath sounds.  Abdominal:     Palpations: Abdomen is soft.   Musculoskeletal:        General: Normal range of motion.     Right lower leg: No edema.     Left lower leg: No edema.   Skin:    General: Skin is warm and dry.     Findings: No rash.   Neurological:     Mental Status: He is alert  and oriented to person, place, and time.   Psychiatric:        Mood and Affect: Mood normal.        Behavior: Behavior normal.        Judgment: Judgment normal.     No results found for any visits on 12/21/23.      Assessment & Plan:  Fluttering sensation of heart -     EKG 12-Lead -     Ambulatory referral to Cardiology  History of right coronary artery stent placement -     Ambulatory referral to Cardiology   Arthur Torres 73 year old male seen today for heart flutter Client is symptomatic and has not seen cardiac for over 10 years since his last heart attack Advised to got [facility] to be seen to rule out any possible cardiac complication or dehydration Report called to charge nurse at [facility]     The above assessment and management plan was discussed with the patient. The patient verbalized understanding of and has agreed to the management plan. Patient is aware to call the clinic if they develop any new symptoms or if symptoms persist or worsen. Patient is aware when to return to the clinic for a follow-up visit. Patient educated on when it is appropriate to go to the emergency department.  Return for Post ED discharge .  Arthur Mangal [place] Thompson, DNP [facility] [address] [place], [place] [address] [phone]  Note: This document was prepared by [employer] voice dictation technology and any errors that results from this process are unintentional.

## 2023-12-21 NOTE — ED Provider Notes (Signed)
 [place] EMERGENCY DEPARTMENT AT [facility] Provider Note   CSN: [id number] Arrival date & time: 12/21/23  1629     Patient presents with: Palpitations   Arthur Torres is a 73 y.o. male history of CAD status post PCI 1999, prostate cancer status postradiation 2005, DM 2, hyperlipidemia, hypertension presents with complaints of palpitations.  This started yesterday.  Not associated with chest pain, shortness of breath or dizziness.  His symptoms are not exertional.  Palpitations may last up to 30 seconds.  Reports he has not had these in the past.    Palpitations  Past Medical History:  Diagnosis Date   CAD (coronary artery disease)    1999 occluded LAD treated with PCI and stenting, 80% stenosis of the first diagonal feeling angioplasty, 60-70% second stenosis, 50% ostial left main stenosis, 25% right coronary artery stenosis, 50% ostial PDA stenosis.   Cancer [facility])    prostate (treated with radiation)   COVID-19    Diabetes mellitus without complication (HCC)    Hyperlipidemia    Hypertension    Joint pain    MI, old    1999       Prior to Admission medications   Medication Sig Start Date End Date Taking? Authorizing Provider  aspirin EC 81 MG tablet Take 81 mg by mouth daily. Swallow whole.   Yes [provider]  Aspirin-Acetaminophen (GOODYS BODY PAIN PO) Take 1 Package by mouth 2 (two) times daily as needed (pain).   Yes [provider]  glipiZIDE  (GLUCOTROL ) 5 MG tablet TAKE 1 TABLET (5 MG TOTAL) BY MOUTH TWICE A DAY BEFORE MEALS Patient taking differently: Take 2.5 mg by mouth 2 (two) times daily before a meal. TAKE 1 TABLET (5 MG TOTAL) BY MOUTH TWICE A DAY BEFORE MEALS 10/17/23  Yes Gladis, Mary-Margaret, FNP  metFORMIN  (GLUCOPHAGE ) 1000 MG tablet Take 1 tablet (1,000 mg total) by mouth 2 (two) times daily with a meal. 10/17/23  Yes Gladis, Mary-Margaret, FNP  metoprolol  succinate (TOPROL -XL) 50 MG 24 hr tablet Take 1 tablet (50 mg total)  by mouth daily. Take with or immediately following a meal. Patient taking differently: Take 25 mg by mouth daily. Take with or immediately following a meal. 10/17/23  Yes Gladis, Mary-Margaret, FNP  pantoprazole  (PROTONIX ) 40 MG tablet Take 1 tablet (40 mg total) by mouth daily. 10/17/23  Yes Martin, Mary-Margaret, FNP  rosuvastatin  (CRESTOR ) 10 MG tablet Take 1 tablet (10 mg total) by mouth daily. 07/12/23  Yes Gladis, Mary-Margaret, FNP    Allergies: Penicillins and Zocor [simvastatin]    Review of Systems  Cardiovascular:  Positive for palpitations.    Updated Vital Signs BP (!) 161/90 (BP Location: Right Arm)   Pulse 73   Temp 98.5 F (36.9 C)   Resp 16   Ht 5' 10 (1.778 m)   Wt 81 kg   SpO2 98%   BMI 25.63 kg/m   Physical Exam Vitals and nursing note reviewed.  Constitutional:      General: He is not in acute distress.    Appearance: He is well-developed.  HENT:     Head: Normocephalic and atraumatic.   Eyes:     Conjunctiva/sclera: Conjunctivae normal.    Cardiovascular:     Rate and Rhythm: Normal rate and regular rhythm.     Heart sounds: No murmur heard. Pulmonary:     Effort: Pulmonary effort is normal. No respiratory distress.     Breath sounds: Normal breath sounds.  Abdominal:  Palpations: Abdomen is soft.     Tenderness: There is no abdominal tenderness.   Musculoskeletal:        General: No swelling.     Cervical back: Neck supple.   Skin:    General: Skin is warm and dry.     Capillary Refill: Capillary refill takes less than 2 seconds.   Neurological:     Mental Status: He is alert.   Psychiatric:        Mood and Affect: Mood normal.     (all labs ordered are listed, but only abnormal results are displayed) Labs Reviewed  COMPREHENSIVE METABOLIC PANEL WITH GFR - Abnormal; Notable for the following components:      Result Value   Glucose, Bld 144 (*)    BUN 24 (*)    All other components within normal limits  CBC WITH  DIFFERENTIAL/PLATELET - Abnormal; Notable for the following components:   Platelets 141 (*)    All other components within normal limits  CBG MONITORING, ED - Abnormal; Notable for the following components:   Glucose-Capillary 126 (*)    All other components within normal limits  TROPONIN I (HIGH SENSITIVITY)  TROPONIN I (HIGH SENSITIVITY)    EKG: EKG Interpretation Date/Time:  Wednesday December 21 2023 16:55:55 EDT Ventricular Rate:  68 PR Interval:  168 QRS Duration:  154 QT Interval:  426 QTC Calculation: 452 R Axis:   -62  Text Interpretation: Normal sinus rhythm Right bundle branch block Left anterior fascicular block Bifascicular block Minimal voltage criteria for LVH, may be normal variant ( R in aVL ) Septal infarct , age undetermined Abnormal ECG When compared with ECG of 10-Apr-1998 07:58, (RBBB and left anterior fascicular block) is now Present Septal infarct is now Present Confirmed by Francesca Fallow ([id number]) on 12/21/2023 6:05:42 PM  Radiology: DG Chest 2 View Result Date: 12/21/2023 CLINICAL DATA:  Chest pain. EXAM: CHEST - 2 VIEW COMPARISON:  October 17, 2023. FINDINGS: The heart size and mediastinal contours are within normal limits. Both lungs are clear. The visualized skeletal structures are unremarkable. IMPRESSION: No active cardiopulmonary disease. Electronically Signed   By: Lynwood Landy Raddle M.D.   On: 12/21/2023 17:14     Procedures   Medications Ordered in the ED - No data to display                                  Medical Decision Making Amount and/or Complexity of Data Reviewed Labs: ordered. Radiology: ordered.   This patient presents to the ED with chief complaint(s) of palpitations.  The complaint involves an extensive differential diagnosis and also carries with it a high risk of complications and morbidity.   Pertinent past medical history as listed in HPI  The differential diagnosis includes  ACS, PE, arrhythmia, dehydration,  hypoglycemia Additional history obtained: Records reviewed Care Everywhere/External Records  Assessment and management:   Hemodynamically stable, nontoxic-appearing patient presenting with complaints of palpitations over the past few days.  Not associated with chest pain, dizziness, shortness of breath.  Has no history of this in the past.  EKG without any arrhythmia.  Troponin without elevation.  Overall lab work is reassuring.  Independent ECG interpretation:  Sinus rhythm, left anterior fascicular block, RBBB  Independent labs interpretation:  The following labs were independently interpreted:  CBC and CMP without significant abnormality, troponin without elevation  Independent visualization and interpretation of imaging: I independently visualized the  following imaging with scope of interpretation limited to determining acute life threatening conditions related to emergency care:  CXR no cardiopulmonary disease   Consultations obtained:   none  Disposition:   Patient will be discharged home. The patient has been appropriately medically screened and/or stabilized in the ED. I have low suspicion for any other emergent medical condition which would require further screening, evaluation or treatment in the ED or require inpatient management. At time of discharge the patient is hemodynamically stable and in no acute distress. I have discussed work-up results and diagnosis with patient and answered all questions. Patient is agreeable with discharge plan. We discussed strict return precautions for returning to the emergency department and they verbalized understanding.     Social Determinants of Health:   none  This note was dictated with voice recognition software.  Despite best efforts at proofreading, errors may have occurred which can change the documentation meaning.       Final diagnoses:  Palpitations    ED Discharge Orders     None          Arthur Torres, Arthur Torres 12/21/23 1918    Francesca Elsie CROME, MD 12/22/23 1257

## 2023-12-21 NOTE — ED Notes (Signed)
Patient verbalizes understanding of discharge instructions. Opportunity for questioning and answers were provided. Armband removed by staff, pt discharged from ED. Ambulated out to lobby  

## 2023-12-22 ENCOUNTER — Telehealth: Payer: Self-pay | Admitting: Nurse Practitioner

## 2023-12-22 NOTE — Telephone Encounter (Signed)
 Pt was seen yesterday was referred to cardiologist. Pt would like to see Dr. Lavona and if he comes to [facility] he would like to see him there.

## 2023-12-22 NOTE — Telephone Encounter (Signed)
 Patient's Referral has been sent to [facility] as Patient requested.

## 2023-12-26 DIAGNOSIS — R002 Palpitations: Secondary | ICD-10-CM | POA: Diagnosis not present

## 2023-12-26 DIAGNOSIS — I2510 Atherosclerotic heart disease of native coronary artery without angina pectoris: Secondary | ICD-10-CM | POA: Diagnosis not present

## 2023-12-26 DIAGNOSIS — Z955 Presence of coronary angioplasty implant and graft: Secondary | ICD-10-CM | POA: Diagnosis not present

## 2023-12-26 DIAGNOSIS — R079 Chest pain, unspecified: Secondary | ICD-10-CM | POA: Diagnosis not present

## 2023-12-26 DIAGNOSIS — R7989 Other specified abnormal findings of blood chemistry: Secondary | ICD-10-CM | POA: Diagnosis not present

## 2023-12-26 DIAGNOSIS — I4510 Unspecified right bundle-branch block: Secondary | ICD-10-CM | POA: Diagnosis not present

## 2023-12-26 DIAGNOSIS — R9431 Abnormal electrocardiogram [ECG] [EKG]: Secondary | ICD-10-CM | POA: Diagnosis not present

## 2023-12-26 DIAGNOSIS — I10 Essential (primary) hypertension: Secondary | ICD-10-CM | POA: Diagnosis not present

## 2023-12-27 DIAGNOSIS — R079 Chest pain, unspecified: Secondary | ICD-10-CM | POA: Diagnosis not present

## 2023-12-27 DIAGNOSIS — Z1330 Encounter for screening examination for mental health and behavioral disorders, unspecified: Secondary | ICD-10-CM | POA: Diagnosis not present

## 2023-12-27 DIAGNOSIS — R002 Palpitations: Secondary | ICD-10-CM | POA: Diagnosis not present

## 2023-12-28 ENCOUNTER — Encounter: Payer: Self-pay | Admitting: Cardiology

## 2023-12-28 ENCOUNTER — Ambulatory Visit: Admitting: Cardiology

## 2023-12-28 ENCOUNTER — Encounter: Payer: Self-pay | Admitting: *Deleted

## 2023-12-28 ENCOUNTER — Other Ambulatory Visit

## 2023-12-28 VITALS — BP 128/70 | HR 80 | Ht 70.0 in | Wt 176.0 lb

## 2023-12-28 DIAGNOSIS — E785 Hyperlipidemia, unspecified: Secondary | ICD-10-CM | POA: Diagnosis not present

## 2023-12-28 DIAGNOSIS — Z79899 Other long term (current) drug therapy: Secondary | ICD-10-CM

## 2023-12-28 DIAGNOSIS — I2510 Atherosclerotic heart disease of native coronary artery without angina pectoris: Secondary | ICD-10-CM | POA: Diagnosis not present

## 2023-12-28 DIAGNOSIS — I10 Essential (primary) hypertension: Secondary | ICD-10-CM | POA: Diagnosis not present

## 2023-12-28 DIAGNOSIS — R002 Palpitations: Secondary | ICD-10-CM

## 2023-12-28 NOTE — Patient Instructions (Addendum)
 Medication Instructions:   Continue all current medications.   Labwork:  TSH - order given  Office will contact with results via phone, letter or mychart.    Testing/Procedures:  Your physician has recommended that you wear a 14 day event monitor. Event monitors are medical devices that record the heart's electrical activity. Doctors most often us  these monitors to diagnose arrhythmias. Arrhythmias are problems with the speed or rhythm of the heartbeat. The monitor is a small, portable device. You can wear one while you do your normal daily activities. This is usually used to diagnose what is causing palpitations/syncope (passing out). Office will contact with results via phone, letter or mychart.     Follow-Up:  1 month   Any Other Special Instructions Will Be Listed Below (If Applicable).   If you need a refill on your cardiac medications before your next appointment, please call your pharmacy.

## 2023-12-28 NOTE — Progress Notes (Signed)
 Cardiology Office Note   Date:  12/28/2023   ID:  Arthur, Torres [birth year], MRN [id number]  PCP:  Gladis Mustard, FNP  Cardiologist:   None Referring:  Gladis Mustard, FNP   Chief Complaint  Patient presents with   Palpitations      History of Present Illness: Arthur Torres is a 73 y.o. male who presents for evaluation of palpitations.  I saw him last in 2014  He has had a PCI and stenting in 1999.  He called to be added to the schedule.  He was in the emergency room the other day with palpitations and I reviewed these records.  There were no unusual findings.  Electrolytes and other labs were unremarkable.  He said he been having these palpitations off and on for a while but they started getting more frequent.  They used to be about once a week.  Then they increased in frequency and intensity.  Over the weekend he had been most of the day Saturday and Sunday.  He went to the emergency room twice.  Once was at [facility] in June and I did review these records.  These were as above.  He did see a cardiologist at [facility] the other day.  This was a Publishing rights manager and I read these notes.  They were considering an echocardiogram and monitor.  He has not had any chest pressure, neck or arm discomfort.  He does not have any shortness of breath, PND or orthopnea.  The palpitations feel like a skipping.  He is not describing sustained tachypalpitations.  He cannot bring these on.  They happen sporadically.  He can go up and down hills and he has to push mower in his large yard and does not have any symptoms related to this.  He cannot find a trigger that brings these on.  He has had none of the symptoms that he had prior to his angioplasty 99.    Past Medical History:  Diagnosis Date   CAD (coronary artery disease)    1999 occluded LAD treated with PCI and stenting, 80% stenosis of the first diagonal feeling angioplasty, 60-70% second stenosis, 50% ostial left main  stenosis, 25% right coronary artery stenosis, 50% ostial PDA stenosis.   Cancer [facility])    prostate (treated with radiation)   COVID-19    Diabetes mellitus without complication (HCC)    Hyperlipidemia    Hypertension    MI, old    42    Past Surgical History:  Procedure Laterality Date   CARDIAC CATHETERIZATION     COLON SURGERY     hemrrhoids   CORONARY ANGIOPLASTY WITH STENT PLACEMENT       Current Outpatient Medications  Medication Sig Dispense Refill   aspirin EC 81 MG tablet Take 81 mg by mouth daily. Swallow whole.     Aspirin-Acetaminophen (GOODYS BODY PAIN PO) Take 1 Package by mouth 2 (two) times daily as needed (pain).     glipiZIDE  (GLUCOTROL ) 5 MG tablet TAKE 1 TABLET (5 MG TOTAL) BY MOUTH TWICE A DAY BEFORE MEALS (Patient taking differently: Take 2.5 mg by mouth 2 (two) times daily before a meal. TAKE 1 TABLET (5 MG TOTAL) BY MOUTH TWICE A DAY BEFORE MEALS) 180 tablet 1   metFORMIN  (GLUCOPHAGE ) 1000 MG tablet Take 1 tablet (1,000 mg total) by mouth 2 (two) times daily with a meal. 90 tablet 1   metoprolol  succinate (TOPROL -XL) 50 MG 24 hr tablet Take 1 tablet (  50 mg total) by mouth daily. Take with or immediately following a meal. (Patient taking differently: Take 25 mg by mouth daily. Take with or immediately following a meal.) 90 tablet 1   pantoprazole  (PROTONIX ) 40 MG tablet Take 1 tablet (40 mg total) by mouth daily. 90 tablet 1   rosuvastatin  (CRESTOR ) 10 MG tablet Take 1 tablet (10 mg total) by mouth daily. 90 tablet 1   No current facility-administered medications for this visit.    Allergies:   Penicillins and Zocor [simvastatin]    Social History:  The patient  reports that he quit smoking about 25 years ago. His smoking use included cigarettes. He has been exposed to tobacco smoke. He has never used smokeless tobacco. He reports that he does not drink alcohol and does not use drugs.   Family History:  The patient's family history includes Asthma in his  son; Cancer in his father; Diabetes in his mother; Healthy in his daughter and son; Stroke in his mother.    ROS:  Please see the history of present illness.   Otherwise, review of systems are positive for none.   All other systems are reviewed and negative.    PHYSICAL EXAM: VS:  BP 128/70   Pulse 80   Ht 5' 10 (1.778 m)   Wt 176 lb (79.8 kg)   BMI 25.25 kg/m  , BMI Body mass index is 25.25 kg/m. GENERAL:  Well appearing HEENT:  Pupils equal round and reactive, fundi not visualized, oral mucosa unremarkable NECK:  No jugular venous distention, waveform within normal limits, carotid upstroke brisk and symmetric, no bruits, no thyromegaly LYMPHATICS:  No cervical, inguinal adenopathy LUNGS:  Clear to auscultation bilaterally BACK:  No CVA tenderness CHEST:  Unremarkable HEART:  PMI not displaced or sustained,S1 and S2 within normal limits, no S3, no S4, no clicks, no rubs, no murmurs ABD:  Flat, positive bowel sounds normal in frequency in pitch, no bruits, no rebound, no guarding, no midline pulsatile mass, no hepatomegaly, no splenomegaly EXT:  2 plus pulses throughout, no edema, no cyanosis no clubbing SKIN:  No rashes no nodules NEURO:  Cranial nerves II through XII grossly intact, motor grossly intact throughout [facility]:  Cognitively intact, oriented to person place and time    EKG:    12/21/2023 sinus rhythm, rate 68, right bundle branch block, left anterior fascicular block, no acute ST-T wave changes.  No change compared to previous EKG April 2025.    Recent Labs: 12/21/2023: ALT 26; BUN 24; Creatinine, Ser 1.02; Hemoglobin 14.1; Platelets 141; Potassium 3.8; Sodium 137    Lipid Panel    Component Value Date/Time   CHOL 121 10/17/2023 0812   CHOL 184 01/03/2013 0851   TRIG 75 10/17/2023 0812   TRIG 81 10/04/2014 1003   TRIG 104 01/03/2013 0851   HDL 43 10/17/2023 0812   HDL 39 (L) 10/04/2014 1003   HDL 37 (L) 01/03/2013 0851   CHOLHDL 2.8 10/17/2023 0812    LDLCALC 63 10/17/2023 0812   LDLCALC 48 03/14/2014 0938   LDLCALC 126 (H) 01/03/2013 0851      Wt Readings from Last 3 Encounters:  12/28/23 176 lb (79.8 kg)  12/21/23 178 lb 9.6 oz (81 kg)  12/21/23 178 lb 9.6 oz (81 kg)      Other studies Reviewed: Additional studies/ records that were reviewed today include: Labs, ED records. Review of the above records demonstrates:  Please see elsewhere in the note.     ASSESSMENT AND  PLAN:  PALPITATIONS: He is probably describing PACs or PVCs but to exclude other dysrhythmias he will wear a 2-week monitor.   Electrolytes including magnesium are unremarkable.  I will make sure that he has had a TSH.    CAD:  The patient has no new sypmtoms.  No further cardiovascular testing is indicated.  We will continue with aggressive risk reduction and meds as listed.  DIABETES:  A1c is 6.0.  No change in therapy.   HTN:  The blood pressure is at target.  No change in therapy.    HYPERLIPIDEMIA: LDL was 74 with an HDL of 46.  No change in therapy.  RBBB:  This is new since 2020.  I will be evaluating for bradycardia arrhythmias as above but he has no symptoms really consistent with this.  m this was present on an EKG earlier this year.   Current medicines are reviewed at length with the patient today.  The patient does not have concerns regarding medicines.  The following changes have been made:  no change  Labs/ tests ordered today include:   Orders Placed This Encounter  Procedures   TSH     Disposition:   FU with me after the monitor.   Signed, Lynwood Schilling, MD  12/28/2023 10:45 AM    [facility]

## 2023-12-29 ENCOUNTER — Ambulatory Visit: Payer: Self-pay | Admitting: Cardiology

## 2023-12-29 LAB — TSH: TSH: 1.84 u[IU]/mL (ref 0.450–4.500)

## 2024-01-05 ENCOUNTER — Telehealth: Payer: Self-pay | Admitting: Cardiology

## 2024-01-05 ENCOUNTER — Other Ambulatory Visit: Payer: Self-pay | Admitting: Cardiology

## 2024-01-05 ENCOUNTER — Ambulatory Visit: Attending: Cardiology

## 2024-01-05 DIAGNOSIS — R002 Palpitations: Secondary | ICD-10-CM

## 2024-01-05 NOTE — Telephone Encounter (Signed)
 Checking percert on the following   LONG TERM MONITOR XT (3-14 DAYS)

## 2024-01-05 NOTE — Progress Notes (Signed)
 [facility] - Clinic Note  01/16/2024     CHIEF COMPLAINT Patient presents for Retina Follow Up   HISTORY OF PRESENT ILLNESS: Arthur Torres is a 73 y.o. male who presents to the clinic today for:   HPI     Retina Follow Up   Patient presents with  Diabetic Retinopathy.  In both eyes.  This started 4 weeks ago.  Duration of 4 weeks.  Since onset it is stable.  I, the attending physician,  performed the HPI with the patient and updated documentation appropriately.        Comments   4 week retina follow up NPDR OU pt is reporting vision is about the same he denies any flashes some floaters his last reading he is not checking A1C 5.8      Last edited by Valdemar Rogue, MD on 01/17/2024 10:44 PM.     Pt states  Referring physician: Gladis Mustard, FNP [address] [place],  [place] [address]  HISTORICAL INFORMATION:   Selected notes from the MEDICAL RECORD NUMBER Referred by Dr. Ross Daniels for DM exam LEE:  Ocular Hx- PMH-    CURRENT MEDICATIONS: No current outpatient medications on file. (Ophthalmic Drugs)   No current facility-administered medications for this visit. (Ophthalmic Drugs)   Current Outpatient Medications (Other)  Medication Sig   aspirin EC 81 MG tablet Take 81 mg by mouth daily. Swallow whole.   Aspirin-Acetaminophen (GOODYS BODY PAIN PO) Take 1 Package by mouth 2 (two) times daily as needed (pain).   glipiZIDE  (GLUCOTROL ) 5 MG tablet TAKE 1 TABLET (5 MG TOTAL) BY MOUTH TWICE A DAY BEFORE MEALS   metoprolol  succinate (TOPROL -XL) 50 MG 24 hr tablet Take 1 tablet (50 mg total) by mouth daily. Take with or immediately following a meal. (Patient taking differently: Take 25 mg by mouth daily. Take with or immediately following a meal.)   pantoprazole  (PROTONIX ) 40 MG tablet Take 1 tablet (40 mg total) by mouth daily.   rosuvastatin  (CRESTOR ) 10 MG tablet Take 1 tablet (10 mg total) by mouth daily.   sitaGLIPtin -metformin   (JANUMET ) 50-1000 MG tablet Take 1 tablet by mouth 2 (two) times daily with a meal.   No current facility-administered medications for this visit. (Other)   REVIEW OF SYSTEMS: ROS   Positive for: Endocrine, Eyes Negative for: Constitutional, Gastrointestinal, Neurological, Skin, Genitourinary, Musculoskeletal, HENT, Cardiovascular, Respiratory, Psychiatric, Allergic/Imm, Heme/Lymph Last edited by Resa Delon ORN, COT on 01/16/2024 12:50 PM.      ALLERGIES Allergies  Allergen Reactions   Penicillins Rash    Childhood allergy - rash all over Unknown if more reactions were occuring   Zocor [Simvastatin] Other (See Comments)    Joint pain and tightness    PAST MEDICAL HISTORY Past Medical History:  Diagnosis Date   CAD (coronary artery disease)    1999 occluded LAD treated with PCI and stenting, 80% stenosis of the first diagonal feeling angioplasty, 60-70% second stenosis, 50% ostial left main stenosis, 25% right coronary artery stenosis, 50% ostial PDA stenosis.   Cancer [facility])    prostate (treated with radiation)   COVID-19    Diabetes mellitus without complication (HCC)    Hyperlipidemia    Hypertension    MI, old    36   Past Surgical History:  Procedure Laterality Date   CARDIAC CATHETERIZATION     COLON SURGERY     hemrrhoids   CORONARY ANGIOPLASTY WITH STENT PLACEMENT     FAMILY HISTORY  Family History  Problem Relation Age of Onset   Diabetes Mother    Stroke Mother    Cancer Father        skin, prostate, stomach   Healthy Daughter    Healthy Son    Asthma Son    SOCIAL HISTORY Social History   Tobacco Use   Smoking status: Former    Current packs/day: 0.00    Types: Cigarettes    Quit date: 03/28/1998    Years since quitting: 25.8    Passive exposure: Past   Smokeless tobacco: Never  Vaping Use   Vaping status: Never Used  Substance Use Topics   Alcohol use: No   Drug use: No       OPHTHALMIC EXAM:  Base Eye Exam     Visual Acuity  (Snellen - Linear)       Right Left   Dist cc 20/60 -2 20/20 -1   Dist ph cc NI          Tonometry (Tonopen, 12:54 PM)       Right Left   Pressure 14 16         Pupils       Pupils Dark Light Shape React APD   Right PERRL 3 2 Round Brisk None   Left PERRL 3 2 Round Brisk None         Visual Fields       Left Right    Full Full         Extraocular Movement       Right Left    Full, Ortho Full, Ortho         Neuro/Psych     Oriented x3: Yes   Mood/Affect: Normal         Dilation     Both eyes: 2.5% Phenylephrine @ 12:54 PM           Slit Lamp and Fundus Exam     Slit Lamp Exam       Right Left   Lids/Lashes Dermatochalasis - upper lid, Meibomian gland dysfunction Dermatochalasis - upper lid, Meibomian gland dysfunction   Conjunctiva/Sclera White and quiet nasal and temporal pinguecula   Cornea arcus Trace tear film debris   Anterior Chamber deep and clear deep and clear   Iris Round and dilated, No NVI Round and dilated, No NVI   Lens 2+ Nuclear sclerosis with brunescence, 2+ Cortical cataract 2+ Nuclear sclerosis with brunescence, 2+ Cortical cataract   Anterior Vitreous mild syneresis mild syneresis         Fundus Exam       Right Left   Disc Pink and Sharp, +PPP Pink and Sharp, mild PPP   C/D Ratio 0.2 0.2   Macula Blunted foveal reflex, central edema / cystic changes, scattered MA/DBH Flat, Blunted foveal reflex, scattered MA, trace cystic changes temporal fovea   Vessels attenuated, Tortuous attenuated, Tortuous   Periphery Attached, 360 MA / DBH -- improved Attached, rare MA           Refraction     Wearing Rx       Sphere Cylinder Axis Add   Right -1.50 +0.75 114 +2.50   Left -1.50 +0.25 163 +2.50            IMAGING AND PROCEDURES  Imaging and Procedures for 01/16/2024  OCT, Retina - OU - Both Eyes       Right Eye Quality was good. Progression has been stable. Findings include no SRF,  abnormal foveal  contour, intraretinal hyper-reflective material, intraretinal fluid, vitreomacular adhesion (Persistent central IRF/IRHM).   Left Eye Quality was good. Central Foveal Thickness: 321. Progression has improved. Findings include normal foveal contour, no SRF, intraretinal hyper-reflective material, intraretinal fluid (Trace persistent focal cystic changes temporal fovea and mac --slightly improved).   Notes *Images captured and stored on drive  Diagnosis / Impression:  OD: +DME/CME - Persistent central IRF/IRHM OS: Trace persistent focal cystic changes temporal fovea and mac -- slightly improved  Clinical management:  See below  Abbreviations: NFP - Normal foveal profile. CME - cystoid macular edema. PED - pigment epithelial detachment. IRF - intraretinal fluid. SRF - subretinal fluid. EZ - ellipsoid zone. ERM - epiretinal membrane. ORA - outer retinal atrophy. ORT - outer retinal tubulation. SRHM - subretinal hyper-reflective material. IRHM - intraretinal hyper-reflective material      Intravitreal Injection, Pharmacologic Agent - OD - Right Eye       Time Out 01/16/2024. 1:45 PM. Confirmed correct patient, procedure, site, and patient consented.   Anesthesia Topical anesthesia was used. Anesthetic medications included Lidocaine 2%, Proparacaine 0.5%.   Procedure Preparation included 5% betadine to ocular surface, eyelid speculum. A supplied (32g) needle was used.   Injection: 1.25 mg Bevacizumab  1.25mg /0.37ml   Route: Intravitreal, Site: Right Eye   NDC: [id number], Lot: [id number], Expiration date: 02/13/2024   Post-op Post injection exam found visual acuity of at least counting fingers. The patient tolerated the procedure well. There were no complications. The patient received written and verbal post procedure care education. Post injection medications were not given.             ASSESSMENT/PLAN:    ICD-10-CM   1. Moderate nonproliferative diabetic retinopathy of both  eyes with macular edema associated with type 2 diabetes mellitus (HCC)  E11.3313 OCT, Retina - OU - Both Eyes    Intravitreal Injection, Pharmacologic Agent - OD - Right Eye    Bevacizumab  (AVASTIN ) SOLN 1.25 mg    2. Long term (current) use of oral hypoglycemic drugs  Z79.84     3. Essential hypertension  I10     4. Hypertensive retinopathy of both eyes  H35.033     5. Combined forms of age-related cataract of both eyes  H25.813       1,2. Moderate Non-proliferative diabetic retinopathy, both eyes (OD>OS)  - A1c 5.6 (01.14.25), 5.8 (07.18.25) - exam shows scattered MA OU (OD > OS) - s/p IVA OD #1 (04.08.24), #2 (05.06.24), #3 (06.03.24), #4 (07.01.24), #5 (07.29.24), #6 (08.26.24), #7 (01.27.25), #8 (02.24.25), #9 (03.24.25), #10 (04.21.25), #11 (05.20.25), #12 (06.17.25) - s/p IVE OD #1 (09.27.24), #2 (10.28.24), #3 (11.25.24), #4 (12.30.24) - FA (04.08.24) shows OD: scattered leaking MA greatest centrally; no NV; Large area of vascular non-perfusion temporal periphery with +perivascular leakage; OS: Scattered MA with late leakage, no NV - BCVA OD 20/60 from 20/50, OS 20/20 -- stable OU - OCT shows OD: +DME/CME - Persistent central IRF/IRHM; OS: Trace persistent focal cystic changes temporal fovea and mac -- slightly improved at 4 weeks - recommend IVA OD #13 today, 07.10.25 for DME w/ f/u in 4 wks - pt wishes to proceed - RBA of procedure discussed, questions answered - IVA informed consent obtained and signed, 01.27.25 (OD) - see procedure note - Eylea  approved for 2025 -- but [employer] funding unavailable - f/u in 4 wks -- DFE/OCT, possible injection  3,4. Hypertensive retinopathy OU - discussed importance of tight BP control - possible RVO/CME component to  macular edema OD - monitor  5. Mixed Cataract OU - The symptoms of cataract, surgical options, and treatments and risks were discussed with patient. - discussed diagnosis and progression - referred to [facility] for consult -- pt is deferring consult for now  Ophthalmic Meds Ordered this visit:  Meds ordered this encounter  Medications   Bevacizumab  (AVASTIN ) SOLN 1.25 mg     Return in about 4 weeks (around 02/13/2024) for NPDR OU, DFE, OCT, Possible Injxn.  There are no Patient Instructions on file for this visit.  Explained the diagnoses, plan, and follow up with the patient and they expressed understanding.  Patient expressed understanding of the importance of proper follow up care.   This document serves as a record of services personally performed by Redell JUDITHANN Hans, MD, PhD. It was created on their behalf by Avelina Pereyra, COA an ophthalmic technician. The creation of this record is the provider's dictation and/or activities during the visit.   Electronically signed by: Avelina GORMAN Pereyra, COT  01/17/24  10:47 PM   This document serves as a record of services personally performed by Redell JUDITHANN Hans, MD, PhD. It was created on their behalf by Almetta Pesa, an ophthalmic technician. The creation of this record is the provider's dictation and/or activities during the visit.    Electronically signed by: Almetta Pesa, OA, 01/17/24  10:47 PM   Redell JUDITHANN Hans, M.D., Ph.D. Diseases & Surgery of the Retina and Vitreous [facility]  I have reviewed the above documentation for accuracy and completeness, and I agree with the above. Redell JUDITHANN Hans, M.D., Ph.D. 01/17/24 10:55 PM   Abbreviations: M myopia (nearsighted); A astigmatism; H hyperopia (farsighted); P presbyopia; Mrx spectacle prescription;  CTL contact lenses; OD right eye; OS left eye; OU both eyes  XT exotropia; ET esotropia; PEK punctate epithelial keratitis; PEE punctate epithelial erosions; DES dry eye syndrome; MGD meibomian gland dysfunction; ATs artificial tears; PFAT's preservative free artificial tears; NSC nuclear sclerotic cataract; PSC posterior subcapsular cataract; ERM epi-retinal  membrane; PVD posterior vitreous detachment; RD retinal detachment; DM diabetes mellitus; DR diabetic retinopathy; NPDR non-proliferative diabetic retinopathy; PDR proliferative diabetic retinopathy; CSME clinically significant macular edema; DME diabetic macular edema; dbh dot blot hemorrhages; CWS cotton wool spot; POAG primary open angle glaucoma; C/D cup-to-disc ratio; HVF humphrey visual field; GVF goldmann visual field; OCT optical coherence tomography; IOP intraocular pressure; BRVO Branch retinal vein occlusion; CRVO central retinal vein occlusion; CRAO central retinal artery occlusion; BRAO branch retinal artery occlusion; RT retinal tear; SB scleral buckle; PPV pars plana vitrectomy; VH Vitreous hemorrhage; PRP panretinal laser photocoagulation; IVK intravitreal kenalog; VMT vitreomacular traction; MH Macular hole;  NVD neovascularization of the disc; NVE neovascularization elsewhere; AREDS age related eye disease study; ARMD age related macular degeneration; POAG primary open angle glaucoma; EBMD epithelial/anterior basement membrane dystrophy; ACIOL anterior chamber intraocular lens; IOL intraocular lens; PCIOL posterior chamber intraocular lens; Phaco/IOL phacoemulsification with intraocular lens placement; PRK photorefractive keratectomy; LASIK laser assisted in situ keratomileusis; HTN hypertension; DM diabetes mellitus; COPD chronic obstructive pulmonary disease

## 2024-01-13 ENCOUNTER — Encounter: Payer: Self-pay | Admitting: Nurse Practitioner

## 2024-01-13 ENCOUNTER — Ambulatory Visit: Admitting: Nurse Practitioner

## 2024-01-13 VITALS — BP 126/69 | HR 67 | Temp 98.0°F | Ht 70.0 in | Wt 176.0 lb

## 2024-01-13 DIAGNOSIS — K219 Gastro-esophageal reflux disease without esophagitis: Secondary | ICD-10-CM | POA: Diagnosis not present

## 2024-01-13 DIAGNOSIS — Z6827 Body mass index (BMI) 27.0-27.9, adult: Secondary | ICD-10-CM | POA: Diagnosis not present

## 2024-01-13 DIAGNOSIS — E1169 Type 2 diabetes mellitus with other specified complication: Secondary | ICD-10-CM | POA: Diagnosis not present

## 2024-01-13 DIAGNOSIS — Z7984 Long term (current) use of oral hypoglycemic drugs: Secondary | ICD-10-CM

## 2024-01-13 DIAGNOSIS — E785 Hyperlipidemia, unspecified: Secondary | ICD-10-CM

## 2024-01-13 DIAGNOSIS — I10 Essential (primary) hypertension: Secondary | ICD-10-CM

## 2024-01-13 DIAGNOSIS — E119 Type 2 diabetes mellitus without complications: Secondary | ICD-10-CM

## 2024-01-13 LAB — BAYER DCA HB A1C WAIVED: HB A1C (BAYER DCA - WAIVED): 5.8 % — ABNORMAL HIGH (ref 4.8–5.6)

## 2024-01-13 LAB — CBC WITH DIFFERENTIAL/PLATELET

## 2024-01-13 MED ORDER — ROSUVASTATIN CALCIUM 10 MG PO TABS: 10.0000 mg | ORAL_TABLET | Freq: Every day | ORAL | 1 refills | Status: DC

## 2024-01-13 MED ORDER — JANUMET 50-1000 MG PO TABS: 1.0000 | ORAL_TABLET | Freq: Two times a day (BID) | ORAL | 5 refills | Status: DC

## 2024-01-13 NOTE — Patient Instructions (Signed)

## 2024-01-13 NOTE — Progress Notes (Signed)
 Subjective:    Patient ID: Arthur Torres, male    DOB: Dec 08, 1950, 73 y.o.   MRN: [id number]   Chief Complaint: medical management of chronic issues     HPI:  Arthur Torres is a 73 y.o. who identifies as a male who was assigned male at birth.   Social history: Lives with: wife Work history: repairs motors   Comes in today for follow up of the following chronic medical issues:  1. Essential hypertension, benign No c/o chest pain, sob or headache. Does not check blood pressure at home. BP Readings from Last 3 Encounters:  12/28/23 128/70  12/21/23 132/77  12/21/23 130/75     2. Hyperlipidemia associated with type 2 diabetes mellitus (HCC) Tries to watch diet but does no dedicated exercise. Lab Results  Component Value Date   CHOL 121 10/17/2023   HDL 43 10/17/2023   LDLCALC 63 10/17/2023   TRIG 75 10/17/2023   CHOLHDL 2.8 10/17/2023     3. Diabetes mellitus treated with oral medication (HCC) He does not check his blood sugars at home. We halfed his glipizide  at last visit. Ran out of janumet  so just used his metformin  for about 1 month. Likes janumet  better. Lab Results  Component Value Date   HGBA1C 6.0 (H) 10/17/2023     4. Gastroesophageal reflux disease without esophagitis Is on protonix  and is doing well.  5. BMI 27.0-27.9,adult No recent weight changes  Wt Readings from Last 3 Encounters:  12/28/23 176 lb (79.8 kg)  12/21/23 178 lb 9.6 oz (81 kg)  12/21/23 178 lb 9.6 oz (81 kg)   BMI Readings from Last 3 Encounters:  12/28/23 25.25 kg/m  12/21/23 25.63 kg/m  12/21/23 25.63 kg/m        New complaints: None today  Allergies  Allergen Reactions   Penicillins Rash    Childhood allergy - rash all over Unknown if more reactions were occuring   Zocor [Simvastatin] Other (See Comments)    Joint pain and tightness    Outpatient Encounter Medications as of 01/13/2024  Medication Sig   aspirin EC 81 MG tablet Take 81 mg by mouth daily.  Swallow whole.   Aspirin-Acetaminophen (GOODYS BODY PAIN PO) Take 1 Package by mouth 2 (two) times daily as needed (pain).   glipiZIDE  (GLUCOTROL ) 5 MG tablet TAKE 1 TABLET (5 MG TOTAL) BY MOUTH TWICE A DAY BEFORE MEALS (Patient taking differently: Take 2.5 mg by mouth 2 (two) times daily before a meal. TAKE 1 TABLET (5 MG TOTAL) BY MOUTH TWICE A DAY BEFORE MEALS)   metFORMIN  (GLUCOPHAGE ) 1000 MG tablet Take 1 tablet (1,000 mg total) by mouth 2 (two) times daily with a meal.   metoprolol  succinate (TOPROL -XL) 50 MG 24 hr tablet Take 1 tablet (50 mg total) by mouth daily. Take with or immediately following a meal. (Patient taking differently: Take 25 mg by mouth daily. Take with or immediately following a meal.)   pantoprazole  (PROTONIX ) 40 MG tablet Take 1 tablet (40 mg total) by mouth daily.   rosuvastatin  (CRESTOR ) 10 MG tablet Take 1 tablet (10 mg total) by mouth daily.   No facility-administered encounter medications on file as of 01/13/2024.    Past Surgical History:  Procedure Laterality Date   CARDIAC CATHETERIZATION     COLON SURGERY     hemrrhoids   CORONARY ANGIOPLASTY WITH STENT PLACEMENT      Family History  Problem Relation Age of Onset   Diabetes Mother  Stroke Mother    Cancer Father        skin, prostate, stomach   Healthy Daughter    Healthy Son    Asthma Son       Controlled substance contract: n/a     Review of Systems  Constitutional:  Negative for diaphoresis.  Eyes:  Negative for pain.  Respiratory:  Negative for shortness of breath.   Cardiovascular:  Negative for chest pain, palpitations and leg swelling.  Gastrointestinal:  Negative for abdominal pain.  Endocrine: Negative for polydipsia.  Skin:  Negative for rash.  Neurological:  Negative for dizziness, weakness and headaches.  Hematological:  Does not bruise/bleed easily.  All other systems reviewed and are negative.      Objective:   Physical Exam Vitals and nursing note reviewed.   Constitutional:      Appearance: Normal appearance. He is well-developed.  HENT:     Head: Normocephalic.     Nose: Nose normal.     Mouth/Throat:     Mouth: Mucous membranes are moist.     Pharynx: Oropharynx is clear.  Eyes:     Pupils: Pupils are equal, round, and reactive to light.  Neck:     Thyroid: No thyroid mass or thyromegaly.     Vascular: No carotid bruit or JVD.     Trachea: Phonation normal.  Cardiovascular:     Rate and Rhythm: Normal rate and regular rhythm.  Pulmonary:     Effort: Pulmonary effort is normal. No respiratory distress.     Breath sounds: Normal breath sounds.  Abdominal:     General: Bowel sounds are normal.     Palpations: Abdomen is soft.     Tenderness: There is no abdominal tenderness.  Musculoskeletal:        General: Normal range of motion.     Cervical back: Normal range of motion and neck supple.  Lymphadenopathy:     Cervical: No cervical adenopathy.  Skin:    General: Skin is warm and dry.  Neurological:     Mental Status: He is alert and oriented to person, place, and time.  Psychiatric:        Behavior: Behavior normal.        Thought Content: Thought content normal.        Judgment: Judgment normal.     BP 126/69   Pulse 67   Temp 98 F (36.7 C) (Temporal)   Ht 5' 10 (1.778 m)   Wt 176 lb (79.8 kg)   SpO2 97%   BMI 25.25 kg/m     Hgba1c 5.8%     Assessment & Plan:   Arthur Torres comes in today with chief complaint of medical management of chronic issues    Diagnosis and orders addressed:  1. Essential hypertension, benign Low sodium diet - CBC with Differential/Platelet - CMP14+EGFR  2. Hyperlipidemia associated with type 2 diabetes mellitus (HCC) Low fat diet - Lipid panel  3. Diabetes mellitus treated with oral medication (HCC) Continue to watch carbs in diet Back on janumet  50/1000. - Bayer DCA Hb A1c Waived  4. Gastroesophageal reflux disease without esophagitis Avoid spicy foods Do not  eat 2 hours prior to bedtime   5. BMI 27.0-27.9,adult Discussed diet and exercise for person with BMI >25 Will recheck weight in 3-6 months  Meds ordered this encounter  Medications   sitaGLIPtin -metformin  (JANUMET ) 50-1000 MG tablet    Sig: Take 1 tablet by mouth 2 (two) times daily with a meal.  Dispense:  30 tablet    Refill:  5    Supervising Provider:   DETTINGER, JOSHUA A [[id number]]   rosuvastatin  (CRESTOR ) 10 MG tablet    Sig: Take 1 tablet (10 mg total) by mouth daily.    Dispense:  90 tablet    Refill:  1    Supervising Provider:   MARYANNE CHEW A [id number]     Labs pending Health Maintenance reviewed Diet and exercise encouraged  Follow up plan: 3 months   Mary-Margaret Gladis, FNP

## 2024-01-14 LAB — CMP14+EGFR
ALT: 24 IU/L (ref 0–44)
AST: 18 IU/L (ref 0–40)
Albumin: 4.5 g/dL (ref 3.9–4.9)
Alkaline Phosphatase: 67 IU/L (ref 44–121)
BUN/Creatinine Ratio: 28 — ABNORMAL HIGH (ref 10–24)
BUN: 28 mg/dL — ABNORMAL HIGH (ref 8–27)
Bilirubin Total: 0.5 mg/dL (ref 0.0–1.2)
CO2: 21 mmol/L (ref 20–29)
Calcium: 9.8 mg/dL (ref 8.6–10.2)
Chloride: 99 mmol/L (ref 96–106)
Creatinine, Ser: 0.99 mg/dL (ref 0.76–1.27)
Globulin, Total: 2.4 g/dL (ref 1.5–4.5)
Glucose: 113 mg/dL — ABNORMAL HIGH (ref 70–99)
Potassium: 4.5 mmol/L (ref 3.5–5.2)
Sodium: 136 mmol/L (ref 134–144)
Total Protein: 6.9 g/dL (ref 6.0–8.5)
eGFR: 82 mL/min/1.73 (ref >59)

## 2024-01-14 LAB — CBC WITH DIFFERENTIAL/PLATELET
Basos: 1
EOS (ABSOLUTE): 0.1 x10E3/uL (ref 0.0–0.2)
Eos: 6
Hematocrit: 46.4 (ref 37.5–51.0)
Hemoglobin: 15 g/dL (ref 13.0–17.7)
Immature Granulocytes: 0
Immature Granulocytes: 0 x10E3/uL (ref 0.0–0.1)
Lymphs: 33
MCH: 29.1 pg (ref 26.6–33.0)
MCHC: 32.3 g/dL (ref 31.5–35.7)
MCV: 90 fL (ref 79–97)
Monocytes Absolute: 0.3 x10E3/uL (ref 0.0–0.4)
Monocytes Absolute: 0.4 x10E3/uL (ref 0.1–0.9)
Monocytes: 7
Neutrophils Absolute: 1.8 x10E3/uL (ref 0.7–3.1)
Neutrophils Absolute: 2.8 x10E3/uL (ref 1.4–7.0)
Neutrophils: 53
Platelets: 152 x10E3/uL (ref 150–450)
RBC: 5.16 x10E6/uL (ref 4.14–5.80)
RDW: 13.3 (ref 11.6–15.4)
WBC: 5.3 x10E3/uL (ref 3.4–10.8)

## 2024-01-14 LAB — LIPID PANEL
Chol/HDL Ratio: 3 ratio (ref 0.0–5.0)
Cholesterol, Total: 118 mg/dL (ref 100–199)
HDL: 40 mg/dL (ref >39)
LDL Chol Calc (NIH): 59 mg/dL (ref 0–99)
Triglycerides: 98 mg/dL (ref 0–149)
VLDL Cholesterol Cal: 19 mg/dL (ref 5–40)

## 2024-01-14 LAB — VITAMIN B12: Vitamin B-12: 273 pg/mL (ref 232–1245)

## 2024-01-16 ENCOUNTER — Encounter (INDEPENDENT_AMBULATORY_CARE_PROVIDER_SITE_OTHER): Payer: Self-pay | Admitting: Ophthalmology

## 2024-01-16 ENCOUNTER — Ambulatory Visit (INDEPENDENT_AMBULATORY_CARE_PROVIDER_SITE_OTHER): Admitting: Ophthalmology

## 2024-01-16 DIAGNOSIS — H35033 Hypertensive retinopathy, bilateral: Secondary | ICD-10-CM | POA: Diagnosis not present

## 2024-01-16 DIAGNOSIS — I10 Essential (primary) hypertension: Secondary | ICD-10-CM | POA: Diagnosis not present

## 2024-01-16 DIAGNOSIS — H25813 Combined forms of age-related cataract, bilateral: Secondary | ICD-10-CM

## 2024-01-16 DIAGNOSIS — E113313 Type 2 diabetes mellitus with moderate nonproliferative diabetic retinopathy with macular edema, bilateral: Secondary | ICD-10-CM | POA: Diagnosis not present

## 2024-01-16 DIAGNOSIS — Z7984 Long term (current) use of oral hypoglycemic drugs: Secondary | ICD-10-CM

## 2024-01-16 MED ORDER — BEVACIZUMAB CHEMO INJECTION 1.25MG/0.05ML SYRINGE FOR KALEIDOSCOPE
1.2500 mg | INTRAVITREAL | Status: AC | PRN
  Administered 2024-01-16: 1.25 mg via INTRAVITREAL

## 2024-01-17 ENCOUNTER — Telehealth: Payer: Self-pay | Admitting: Cardiology

## 2024-01-17 NOTE — Telephone Encounter (Signed)
 See result note from 7/03

## 2024-01-17 NOTE — Telephone Encounter (Signed)
 Patient says he is returning a call to discuss results.

## 2024-01-23 ENCOUNTER — Ambulatory Visit: Payer: Self-pay | Admitting: Nurse Practitioner

## 2024-01-27 ENCOUNTER — Ambulatory Visit: Payer: Medicare HMO

## 2024-01-27 VITALS — Ht 70.0 in | Wt 176.0 lb

## 2024-01-27 DIAGNOSIS — Z0000 Encounter for general adult medical examination without abnormal findings: Secondary | ICD-10-CM | POA: Diagnosis not present

## 2024-01-27 DIAGNOSIS — R002 Palpitations: Secondary | ICD-10-CM | POA: Diagnosis not present

## 2024-01-27 NOTE — Patient Instructions (Signed)
 Mr. Arthur Torres , Thank you for taking time out of your busy schedule to complete your Annual Wellness Visit with me. I enjoyed our conversation and look forward to speaking with you again next year. I, as well as your care team,  appreciate your ongoing commitment to your health goals. Please review the following plan we discussed and let me know if I can assist you in the future. Your Game plan/ To Do List    Referrals: If you haven't heard from the office you've been referred to, please reach out to them at the phone provided.   Follow up Visits: We will see or speak with you next year for your Next Medicare AWV with our clinical staff on January 29, 2025 at 9:20 am telephone visit Have you seen your provider in the last 6 months (3 months if uncontrolled diabetes)? Yes  Clinician Recommendations:  Aim for 30 minutes of exercise or brisk walking, 6-8 glasses of water, and 5 servings of fruits and vegetables each day.       This is a list of the screenings recommended for you:  Health Maintenance  Topic Date Due   COVID-19 Vaccine (1) Never done   Flu Shot  01/27/2024   Yearly kidney health urinalysis for diabetes  07/11/2024   Hemoglobin A1C  07/15/2024   Eye exam for diabetics  12/12/2024   Yearly kidney function blood test for diabetes  01/12/2025   Complete foot exam   01/12/2025   Medicare Annual Wellness Visit  01/26/2025   Cologuard (Stool DNA test)  01/10/2026   DTaP/Tdap/Td vaccine (2 - Td or Tdap) 01/20/2031   Colon Cancer Screening  01/11/2033   Pneumococcal Vaccine for age over 37  Completed   Hepatitis C Screening  Completed   Zoster (Shingles) Vaccine  Completed   Hepatitis B Vaccine  Aged Out   HPV Vaccine  Aged Out   Meningitis B Vaccine  Aged Out   We will mail the Advanced Directives packet to you because you visit today was a virtual visit.   Advanced directives: (Provided) Advance directive discussed with you today. I have provided a copy for you to complete at home  and have notarized. Once this is complete, please bring a copy in to our office so we can scan it into your chart.  Advance Care Planning is important because it:  [x]  Makes sure you receive the medical care that is consistent with your values, goals, and preferences  [x]  It provides guidance to your family and loved ones and reduces their decisional burden about whether or not they are making the right decisions based on your wishes.  Follow the link provided in your after visit summary or read over the paperwork we have mailed to you to help you started getting your Advance Directives in place. If you need assistance in completing these, please reach out to us  so that we can help you!  See attachments for Preventive Care and Fall Prevention Tips.

## 2024-01-27 NOTE — Progress Notes (Signed)
 Subjective:   Arthur Torres is a 73 y.o. who presents for a Medicare Wellness preventive visit.  As a reminder, Annual Wellness Visits don't include a physical exam, and some assessments may be limited, especially if this visit is performed virtually. We may recommend an in-person follow-up visit with your provider if needed.  Visit Complete: Virtual I connected with  Arthur Torres on 01/27/24 by a audio enabled telemedicine application and verified that I am speaking with the correct person using two identifiers.  Patient Location: Home  Provider Location: Home Office  I discussed the limitations of evaluation and management by telemedicine. The patient expressed understanding and agreed to proceed.  Vital Signs: Because this visit was a virtual/telehealth visit, some criteria may be missing or patient reported. Any vitals not documented were not able to be obtained and vitals that have been documented are patient reported.  VideoDeclined- This patient declined Librarian, academic. Therefore the visit was completed with audio only.  Persons Participating in Visit: Patient.  AWV Questionnaire: No: Patient Medicare AWV questionnaire was not completed prior to this visit.  Cardiac Risk Factors include: advanced age (>67men, >18 women);diabetes mellitus;dyslipidemia;hypertension;male gender;Other (see comment), Risk factor comments: CAD     Objective:    Today's Vitals   01/27/24 0931  Weight: 176 lb (79.8 kg)  Height: 5' 10 (1.778 m)   Body mass index is 25.25 kg/m.     01/27/2024    9:34 AM 12/21/2023    4:54 PM 01/26/2023    1:45 PM 01/22/2022    4:00 PM 01/21/2021   11:37 AM 09/12/2019    8:39 AM  Advanced Directives  Does Patient Have a Medical Advance Directive? No No Yes Yes Yes No  Type of Best boy of [place];Living will Healthcare Power of [place];Living will Living will   Copy of Healthcare Power of Attorney  in Chart?   No - copy requested No - copy requested    Would patient like information on creating a medical advance directive? Yes (MAU/Ambulatory/Procedural Areas - Information given) No - Patient declined    No - Patient declined    Current Medications (verified) Outpatient Encounter Medications as of 01/27/2024  Medication Sig   aspirin EC 81 MG tablet Take 81 mg by mouth daily. Swallow whole.   Aspirin-Acetaminophen (GOODYS BODY PAIN PO) Take 1 Package by mouth 2 (two) times daily as needed (pain).   glipiZIDE  (GLUCOTROL ) 5 MG tablet TAKE 1 TABLET (5 MG TOTAL) BY MOUTH TWICE A DAY BEFORE MEALS   metoprolol  succinate (TOPROL -XL) 50 MG 24 hr tablet Take 1 tablet (50 mg total) by mouth daily. Take with or immediately following a meal. (Patient taking differently: Take 25 mg by mouth daily. Take with or immediately following a meal.)   pantoprazole  (PROTONIX ) 40 MG tablet Take 1 tablet (40 mg total) by mouth daily.   rosuvastatin  (CRESTOR ) 10 MG tablet Take 1 tablet (10 mg total) by mouth daily.   sitaGLIPtin -metformin  (JANUMET ) 50-1000 MG tablet Take 1 tablet by mouth 2 (two) times daily with a meal.   No facility-administered encounter medications on file as of 01/27/2024.    Allergies (verified) Penicillins and Zocor [simvastatin]   History: Past Medical History:  Diagnosis Date   CAD (coronary artery disease)    1999 occluded LAD treated with PCI and stenting, 80% stenosis of the first diagonal feeling angioplasty, 60-70% second stenosis, 50% ostial left main stenosis, 25% right coronary artery stenosis, 50%  ostial PDA stenosis.   Cancer (HCC)    prostate (treated with radiation)   COVID-19    Diabetes mellitus without complication (HCC)    Hyperlipidemia    Hypertension    MI, old    48   Past Surgical History:  Procedure Laterality Date   CARDIAC CATHETERIZATION     COLON SURGERY     hemrrhoids   CORONARY ANGIOPLASTY WITH STENT PLACEMENT     Family History  Problem  Relation Age of Onset   Diabetes Mother    Stroke Mother    Cancer Father        skin, prostate, stomach   Healthy Daughter    Healthy Son    Asthma Son    Social History   Socioeconomic History   Marital status: Married    Spouse name: Arthur Torres   Number of children: 2   Years of education: Not on file   Highest education level: High school graduate  Occupational History   Occupation: Autobody work    Associate Professor: [employer]  Tobacco Use   Smoking status: Former    Current packs/day: 0.00    Average packs/day: 2.0 packs/day for 25.7 years (51.5 ttl pk-yrs)    Types: Cigarettes    Start date: 59    Quit date: 03/28/1998    Years since quitting: 25.8    Passive exposure: Past   Smokeless tobacco: Never  Vaping Use   Vaping status: Never Used  Substance and Sexual Activity   Alcohol use: No   Drug use: No   Sexual activity: Yes  Other Topics Concern   Not on file  Social History Narrative   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 [place]   Social Drivers of Health   Financial Resource Strain: Low Risk  (01/27/2024)   Overall Financial Resource Strain (CARDIA)    Difficulty of Paying Living Expenses: Not hard at all  Food Insecurity: No Food Insecurity (01/27/2024)   Hunger Vital Sign    Worried About Running Out of Food in the Last Year: Never true    Ran Out of Food in the Last Year: Never true  Transportation Needs: No Transportation Needs (01/27/2024)   PRAPARE - Administrator, Civil Service (Medical): No    Lack of Transportation (Non-Medical): No  Physical Activity: Sufficiently Active (01/27/2024)   Exercise Vital Sign    Days of Exercise per Week: 7 days    Minutes of Exercise per Session: 30 min  Stress: No Stress Concern Present (01/27/2024)   [employer] of Occupational Health - Occupational Stress Questionnaire    Feeling of Stress: Not at all  Social Connections: Moderately Isolated (01/27/2024)   Social Connection and Isolation Panel    Frequency  of Communication with Friends and Family: More than three times a week    Frequency of Social Gatherings with Friends and Family: More than three times a week    Attends Religious Services: Never    Database administrator or Organizations: No    Attends Engineer, structural: Never    Marital Status: Married    Tobacco Counseling Counseling given: Yes    Clinical Intake:  Pre-visit preparation completed: Yes  Pain : No/denies pain     BMI - recorded: 25.25 Nutritional Status: BMI 25 -29 Overweight Diabetes: Yes CBG done?: No (telehealth visit. unable to obtain cbg) Did pt. Torres in CBG monitor from home?: No  Lab Results  Component Value Date   HGBA1C 5.8 (H)  01/13/2024   HGBA1C 6.0 (H) 10/17/2023   HGBA1C 5.6 07/12/2023     How often do you need to have someone help you when you read instructions, pamphlets, or other written materials from your doctor or pharmacy?: 1 - Never  Interpreter Needed?: No  Information entered by :: Arthur Torres ORN [facility]   Activities of Daily Living     01/27/2024    9:36 AM  In your present state of health, do you have any difficulty performing the following activities:  Hearing? 0  Vision? 0  Difficulty concentrating or making decisions? 0  Walking or climbing stairs? 0  Dressing or bathing? 0  Doing errands, shopping? 0  Preparing Food and eating ? N  Using the Toilet? N  In the past six months, have you accidently leaked urine? N  Do you have problems with loss of bowel control? N  Managing your Medications? N  Managing your Finances? N  Housekeeping or managing your Housekeeping? N    Patient Care Team: Arthur Mustard, FNP as PCP - General (Nurse Practitioner) Arthur Agent, MD as Consulting Physician (Cardiology) Arthur Rogue, MD as Consulting Physician (Ophthalmology) Arthur Ross Lacy Phebe, MD as Referring Physician (Optometry) Arthur Torres, DPM as Consulting Physician (Podiatry)  I have updated your Care Teams  any recent Medical Services you may have received from other providers in the past year.     Assessment:   This is a routine wellness examination for Arthur Torres.  Hearing/Vision screen Hearing Screening - Comments:: Patient denies any hearing difficulties.   Vision Screening - Comments:: Wears rx glasses - up to date with routine eye exams with  Dr. Ladora at [facility] and Dr Torres Arthur in [place] for injections in RT eye   Goals Addressed               This Visit's Progress     Patient Stated (pt-stated)        I'd like to stay above ground a few more years. They have had several deaths recently within the company he works for in the last couple of weeks.        Depression Screen     01/27/2024    9:39 AM 01/13/2024    8:27 AM 12/21/2023    3:26 PM 10/17/2023    8:15 AM 07/12/2023    8:22 AM 01/26/2023    1:44 PM 12/31/2022    7:57 AM  PHQ 2/9 Scores  PHQ - 2 Score 0 0  0 0 0 0  PHQ- 9 Score 0     0 0  Exception Documentation   Medical reason        Fall Risk     01/27/2024    9:41 AM 01/13/2024    8:27 AM 10/17/2023    8:15 AM 07/12/2023    8:22 AM 01/26/2023    1:42 PM  Fall Risk   Falls in the past year? 0 0 0 0 0  Number falls in past yr: 0    0  Injury with Fall? 0    0  Risk for fall due to : No Fall Risks    No Fall Risks  Follow up Falls evaluation completed;Education provided;Falls prevention discussed    Falls prevention discussed    MEDICARE RISK AT HOME:  Medicare Risk at Home Any stairs in or around the home?: Yes If so, are there any without handrails?: Yes Home free of loose throw rugs in walkways, pet beds, electrical cords, etc?:  Yes Adequate lighting in your home to reduce risk of falls?: Yes Life alert?: No Use of a cane, walker or w/c?: No Grab bars in the bathroom?: No Shower chair or bench in shower?: No Elevated toilet seat or a handicapped toilet?: No  TIMED UP AND GO:  Was the test performed?  No  Cognitive Function: 6CIT  completed    09/11/2018    4:14 PM  MMSE - Mini Mental State Exam  Orientation to time 5  Orientation to Place 5  Registration 3  Attention/ Calculation 5  Recall 3  Language- name 2 objects 2  Language- repeat 1  Language- follow 3 step command 3  Language- read & follow direction 1  Write a sentence 1  Copy design 1  Total score 30        01/27/2024    9:42 AM 01/26/2023    1:45 PM 01/22/2022    4:03 PM 01/21/2021   11:39 AM 09/12/2019    8:40 AM  6CIT Screen  What Year? 0 points 0 points 0 points 0 points 0 points  What month? 0 points 0 points 0 points 0 points 0 points  What time? 0 points 0 points 0 points 0 points 0 points  Count back from 20 0 points 0 points 0 points 0 points 0 points  Months in reverse 0 points 0 points 0 points 0 points 0 points  Repeat phrase 0 points 0 points 0 points 0 points 0 points  Total Score 0 points 0 points 0 points 0 points 0 points    Immunizations Immunization History  Administered Date(s) Administered   Fluad Quad(high Dose 65+) 05/12/2021, 05/27/2022   Fluad Trivalent(High Dose 65+) 04/01/2023   Influenza,inj,Quad PF,6+ Mos 04/13/2013, 04/17/2015, 03/27/2018   Pneumococcal Conjugate-13 08/21/2019   Pneumococcal Polysaccharide-23 12/11/2013, 10/23/2020   Tdap 01/19/2021   Zoster Recombinant(Shingrix ) 05/03/2017, 08/08/2017    Screening Tests Health Maintenance  Topic Date Due   COVID-19 Vaccine (1) Never done   OPHTHALMOLOGY EXAM  09/20/2023   INFLUENZA VACCINE  01/27/2024   Diabetic kidney evaluation - Urine ACR  07/11/2024   HEMOGLOBIN A1C  07/15/2024   Diabetic kidney evaluation - eGFR measurement  01/12/2025   FOOT EXAM  01/12/2025   Medicare Annual Wellness (AWV)  01/26/2025   Fecal DNA (Cologuard)  01/10/2026   DTaP/Tdap/Td (2 - Td or Tdap) 01/20/2031   Colonoscopy  01/11/2033   Pneumococcal Vaccine: 50+ Years  Completed   Hepatitis C Screening  Completed   Zoster Vaccines- Shingrix   Completed   Hepatitis B  Vaccines  Aged Out   HPV VACCINES  Aged Out   Meningococcal B Vaccine  Aged Out    Health Maintenance  Health Maintenance Due  Topic Date Due   COVID-19 Vaccine (1) Never done   OPHTHALMOLOGY EXAM  09/20/2023   INFLUENZA VACCINE  01/27/2024   Health Maintenance Items Addressed: Routine health screenings are up to date. Patient is aware of recommended vaccines.    Additional Screening:  Vision Screening: Recommended annual ophthalmology exams for early detection of glaucoma and other disorders of the eye. Would you like a referral to an eye doctor? No   Patient currently seeing Dr. Redell Hans for RT eye injections  Dental Screening: Recommended annual dental exams for proper oral hygiene  Community Resource Referral / Chronic Care Management: CRR required this visit?  No   CCM required this visit?  No   Plan:    I have personally reviewed and noted  the following in the patient's chart:   Medical and social history Use of alcohol, tobacco or illicit drugs  Current medications and supplements including opioid prescriptions. Patient is not currently taking opioid prescriptions. Functional ability and status Nutritional status Physical activity Advanced directives List of other physicians Hospitalizations, surgeries, and ER visits in previous 12 months Vitals Screenings to include cognitive, depression, and falls Referrals and appointments  In addition, I have reviewed and discussed with patient certain preventive protocols, quality metrics, and best practice recommendations. A written personalized care plan for preventive services as well as general preventive health recommendations were provided to patient.   Arthur Torres, CMA   01/27/2024   After Visit Summary: (Mail) Due to this being a telephonic visit, the after visit summary with patients personalized plan was offered to patient via mail   Notes: Nothing significant to report at this time.

## 2024-01-29 ENCOUNTER — Other Ambulatory Visit: Payer: Self-pay | Admitting: Nurse Practitioner

## 2024-01-30 ENCOUNTER — Ambulatory Visit: Payer: Self-pay | Admitting: Cardiology

## 2024-01-30 DIAGNOSIS — R002 Palpitations: Secondary | ICD-10-CM | POA: Diagnosis not present

## 2024-01-30 NOTE — Progress Notes (Unsigned)
  Cardiology Office Note:   Date:  01/30/2024  ID:  Arthur Torres, DOB [birth year], MRN [id number] PCP: Gladis Mustard, FNP  [facility] Providers Cardiologist:  None {  History of Present Illness:   Arthur Torres is a 73 y.o. male  who presents for evaluation of palpitations. He has had a PCI and stenting in 1999.   I saw him as an add-on in July 2025.  He was having increasing palpitations.  He wore a monitor with no significant arrhythmias.  There were only occasional premature atrial or ventricular contractions.  He returns for follow-up.  He said that he has not had any further episodes similar to the episodes that took him to the emergency room a couple of times recently.  He wore a monitor and had no significant arrhythmias.  He had some premature atrial contractions and ventricular contractions.  He said that since that time he has done well.  He has been at work.  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  He is not having any chest pressure, neck or arm discomfort.  He has no new shortness of breath, PND or orthopnea.  Is not having any palpitations, presyncope or syncope.   ROS: As stated in the HPI and negative for all other systems.  Studies Reviewed:    EKG:     NA  Risk Assessment/Calculations:       Physical Exam:   VS:  There were no vitals taken for this visit.   Wt Readings from Last 3 Encounters:  01/27/24 176 lb (79.8 kg)  01/13/24 176 lb (79.8 kg)  12/28/23 176 lb (79.8 kg)     GEN: Well nourished, well developed in no acute distress NECK: No JVD; No carotid bruits CARDIAC: RRR, no murmurs, rubs, gallops RESPIRATORY:  Clear to auscultation without rales, wheezing or rhonchi  ABDOMEN: Soft, non-tender, non-distended EXTREMITIES:  No edema; No deformity   ASSESSMENT AND PLAN:   PALPITATIONS:   He is not having any further symptomatic tachypalpitations.  He had normal thyroid studies.  I reviewed the monitor.  No  change in therapy.   CAD: He has no symptoms related to this.  He will continue with risk reduction.   DIABETES:  A1c is 6.0 as previously stated  HTN:  The blood pressure is at target.  No change in therapy.    t target.  No change in therapy.    HYPERLIPIDEMIA: LDL was 74.  No change in therapy.    Right bundle branch block/left anterior fascicular block: This was not new when I saw him back.  I was concerned about bradycardic rhythm but I cannot prove this.  He had no further episodes.  Will see him back in 6 months to discuss.   Follow up with me in 6 months.  Signed, Lynwood Schilling, MD

## 2024-01-30 NOTE — Progress Notes (Signed)
 [facility] - Clinic Note  02/13/2024     CHIEF COMPLAINT Patient presents for Retina Follow Up   HISTORY OF PRESENT ILLNESS: Arthur Torres is a 73 y.o. male who presents to the clinic today for:   HPI     Retina Follow Up   Patient presents with  Diabetic Retinopathy.  In both eyes.  This started 4 weeks ago.  Duration of 4 weeks.  Since onset it is stable.  I, the attending physician,  performed the HPI with the patient and updated documentation appropriately.        Comments   4 week retina follow up NPDR pt is reporting no vision changes noticed IVA OD pt denies any flashes has some floaters pt is not checking his blood sugar at this time last A1C 5.8      Last edited by Valdemar Rogue, MD on 02/18/2024  2:34 AM.      Pt states everything seems stable.   Referring physician: Gladis Mustard, FNP [address] [place],  [place] [address]  HISTORICAL INFORMATION:   Selected notes from the MEDICAL RECORD NUMBER Referred by Dr. Ross Daniels for DM exam LEE:  Ocular Hx- PMH-    CURRENT MEDICATIONS: No current outpatient medications on file. (Ophthalmic Drugs)   No current facility-administered medications for this visit. (Ophthalmic Drugs)   Current Outpatient Medications (Other)  Medication Sig   aspirin EC 81 MG tablet Take 81 mg by mouth daily. Swallow whole.   Aspirin-Acetaminophen (GOODYS BODY PAIN PO) Take 1 Package by mouth 2 (two) times daily as needed (pain).   glipiZIDE  (GLUCOTROL ) 5 MG tablet TAKE 1 TABLET (5 MG TOTAL) BY MOUTH TWICE A DAY BEFORE MEALS   metoprolol  succinate (TOPROL -XL) 50 MG 24 hr tablet Take 1 tablet (50 mg total) by mouth daily. Take with or immediately following a meal. (Patient taking differently: Take 25 mg by mouth daily. Take with or immediately following a meal.)   pantoprazole  (PROTONIX ) 40 MG tablet Take 1 tablet (40 mg total) by mouth daily.   rosuvastatin  (CRESTOR ) 10 MG tablet Take 1 tablet (10 mg  total) by mouth daily.   sitaGLIPtin -metformin  (JANUMET ) 50-1000 MG tablet Take 1 tablet by mouth 2 (two) times daily with a meal.   No current facility-administered medications for this visit. (Other)   REVIEW OF SYSTEMS: ROS   Positive for: Endocrine, Eyes Negative for: Constitutional, Gastrointestinal, Neurological, Skin, Genitourinary, Musculoskeletal, HENT, Cardiovascular, Respiratory, Psychiatric, Allergic/Imm, Heme/Lymph Last edited by Resa Delon ORN, COT on 02/13/2024  1:34 PM.       ALLERGIES Allergies  Allergen Reactions   Penicillins Rash    Childhood allergy - rash all over Unknown if more reactions were occuring   Zocor [Simvastatin] Other (See Comments)    Joint pain and tightness    PAST MEDICAL HISTORY Past Medical History:  Diagnosis Date   CAD (coronary artery disease)    1999 occluded LAD treated with PCI and stenting, 80% stenosis of the first diagonal feeling angioplasty, 60-70% second stenosis, 50% ostial left main stenosis, 25% right coronary artery stenosis, 50% ostial PDA stenosis.   Cancer [facility])    prostate (treated with radiation)   COVID-19    Diabetes mellitus without complication (HCC)    Hyperlipidemia    Hypertension    MI, old    6   Past Surgical History:  Procedure Laterality Date   CARDIAC CATHETERIZATION     COLON SURGERY     hemrrhoids  CORONARY ANGIOPLASTY WITH STENT PLACEMENT     FAMILY HISTORY Family History  Problem Relation Age of Onset   Diabetes Mother    Stroke Mother    Cancer Father        skin, prostate, stomach   Healthy Daughter    Healthy Son    Asthma Son    SOCIAL HISTORY Social History   Tobacco Use   Smoking status: Former    Current packs/day: 0.00    Average packs/day: 2.0 packs/day for 25.7 years (51.5 ttl pk-yrs)    Types: Cigarettes    Start date: 46    Quit date: 03/28/1998    Years since quitting: 25.9    Passive exposure: Past   Smokeless tobacco: Never  Vaping Use   Vaping  status: Never Used  Substance Use Topics   Alcohol use: No   Drug use: No       OPHTHALMIC EXAM:  Base Eye Exam     Visual Acuity (Snellen - Linear)       Right Left   Dist cc 20/40 -2 20/30 -3   Dist ph cc NI NI         Tonometry (Tonopen, 1:41 PM)       Right Left   Pressure 16 18         Pupils       Pupils Dark Light Shape React APD   Right PERRL 3 2 Round Brisk None   Left PERRL 3 2 Round Brisk None         Visual Fields       Left Right    Full Full         Extraocular Movement       Right Left    Full, Ortho Full, Ortho         Neuro/Psych     Oriented x3: Yes   Mood/Affect: Normal         Dilation     Both eyes: 2.5% Phenylephrine @ 1:41 PM           Slit Lamp and Fundus Exam     Slit Lamp Exam       Right Left   Lids/Lashes Dermatochalasis - upper lid, Meibomian gland dysfunction Dermatochalasis - upper lid, Meibomian gland dysfunction   Conjunctiva/Sclera White and quiet nasal and temporal pinguecula   Cornea arcus Trace tear film debris   Anterior Chamber deep and clear deep and clear   Iris Round and dilated, No NVI Round and dilated, No NVI   Lens 2+ Nuclear sclerosis with brunescence, 2+ Cortical cataract 2+ Nuclear sclerosis with brunescence, 2+ Cortical cataract   Anterior Vitreous mild syneresis mild syneresis         Fundus Exam       Right Left   Disc Pink and Sharp, +PPP Pink and Sharp, mild PPP   C/D Ratio 0.2 0.2   Macula Blunted foveal reflex, central edema / cystic changes, scattered MA/DBH greatest temporal mac Flat, Blunted foveal reflex, scattered MA, trace cystic changes temporal fovea   Vessels attenuated, Tortuous attenuated, mild tortuosity   Periphery Attached, 360 MA / DBH -- improved Attached, rare MA           Refraction     Wearing Rx       Sphere Cylinder Axis Add   Right -1.50 +0.75 114 +2.50   Left -1.50 +0.25 163 +2.50            IMAGING AND  PROCEDURES  Imaging  and Procedures for 02/13/2024  OCT, Retina - OU - Both Eyes       Right Eye Quality was good. Central Foveal Thickness: 327. Progression has improved. Findings include no SRF, abnormal foveal contour, intraretinal hyper-reflective material, intraretinal fluid, vitreomacular adhesion (Persistent central IRF/IRHM--slightly improved).   Left Eye Quality was good. Central Foveal Thickness: 329. Progression has improved. Findings include normal foveal contour, no SRF, intraretinal hyper-reflective material, intraretinal fluid (Trace persistent focal cystic changes temporal fovea and mac --slightly improved).   Notes *Images captured and stored on drive  Diagnosis / Impression:  OD: +DME/CME - Persistent central IRF/IRHM--slightly improved OS: Trace persistent focal cystic changes temporal fovea and mac -- slightly improved  Clinical management:  See below  Abbreviations: NFP - Normal foveal profile. CME - cystoid macular edema. PED - pigment epithelial detachment. IRF - intraretinal fluid. SRF - subretinal fluid. EZ - ellipsoid zone. ERM - epiretinal membrane. ORA - outer retinal atrophy. ORT - outer retinal tubulation. SRHM - subretinal hyper-reflective material. IRHM - intraretinal hyper-reflective material      Intravitreal Injection, Pharmacologic Agent - OD - Right Eye       Time Out 02/13/2024. 2:38 PM. Confirmed correct patient, procedure, site, and patient consented.   Anesthesia Topical anesthesia was used. Anesthetic medications included Lidocaine 2%, Proparacaine 0.5%.   Procedure Preparation included 5% betadine to ocular surface, eyelid speculum. A supplied (32g) needle was used.   Injection: 1.25 mg Bevacizumab  1.25mg /0.12ml   Torres: Intravitreal, Site: Right Eye   NDC: [id number], Lot: [id number], Expiration date: 02/20/2024   Post-op Post injection exam found visual acuity of at least counting fingers. The patient tolerated the procedure well. There were no  complications. The patient received written and verbal post procedure care education. Post injection medications were not given.              ASSESSMENT/PLAN:    ICD-10-CM   1. Moderate nonproliferative diabetic retinopathy of both eyes with macular edema associated with type 2 diabetes mellitus (HCC)  E11.3313 OCT, Retina - OU - Both Eyes    Intravitreal Injection, Pharmacologic Agent - OD - Right Eye    Bevacizumab  (AVASTIN ) SOLN 1.25 mg    2. Long term (current) use of oral hypoglycemic drugs  Z79.84     3. Essential hypertension  I10     4. Hypertensive retinopathy of both eyes  H35.033     5. Combined forms of age-related cataract of both eyes  H25.813      1,2. Moderate Non-proliferative diabetic retinopathy, both eyes (OD>OS)  - A1c 5.6 (01.14.25), 5.8 (07.18.25) - exam shows scattered MA OU (OD > OS) - s/p IVA OD #1 (04.08.24), #2 (05.06.24), #3 (06.03.24), #4 (07.01.24), #5 (07.29.24), #6 (08.26.24), #7 (01.27.25), #8 (02.24.25), #9 (03.24.25), #10 (04.21.25), #11 (05.20.25), #12 (06.17.25), #13 (07.10.25) - s/p IVE OD #1 (09.27.24), #2 (10.28.24), #3 (11.25.24), #4 (12.30.24) - FA (04.08.24) shows OD: scattered leaking MA greatest centrally; no NV; Large area of vascular non-perfusion temporal periphery with +perivascular leakage; OS: Scattered MA with late leakage, no NV - BCVA OD 20/40 from 20/60, OS 20/30 from 20/20 - OCT shows OD: +DME/CME - Persistent central IRF/IRHM--slightly improved; OS: Trace persistent focal cystic changes temporal fovea and mac -- slightly improved at 4 weeks - **discussed decreased efficacy / resistance to Avastin  and potential benefit of switching medication** - recommend IVA OD #14 (08.18.25) today for DME w/ f/u in 4 wks - pt wishes to proceed - RBA  of procedure discussed, questions answered - IVA informed consent obtained and signed, 01.27.25 (OD) - see procedure note - Eylea  approved for 2025 -- but [employer] funding unavailable -  f/u in 4 wks -- DFE/OCT, possible injection(s)  3,4. Hypertensive retinopathy OU - discussed importance of tight BP control - possible RVO/CME component to macular edema OD - monitor  5. Mixed Cataract OU - The symptoms of cataract, surgical options, and treatments and risks were discussed with patient. - discussed diagnosis and progression - referred to [facility] for consult -- pt is deferring consult for now  Ophthalmic Meds Ordered this visit:  Meds ordered this encounter  Medications   Bevacizumab  (AVASTIN ) SOLN 1.25 mg     Return in 4 weeks (on 03/12/2024) for +DME, Dilated Exam, OCT, Possible Injxn.  There are no Patient Instructions on file for this visit.  Explained the diagnoses, plan, and follow up with the patient and they expressed understanding.  Patient expressed understanding of the importance of proper follow up care.   This document serves as a record of services personally performed by Redell JUDITHANN Hans, MD, PhD. It was created on their behalf by Avelina Pereyra, COA an ophthalmic technician. The creation of this record is the provider's dictation and/or activities during the visit.   Electronically signed by: Avelina GORMAN Pereyra, COT  02/18/24  2:45 AM   This document serves as a record of services personally performed by Redell JUDITHANN Hans, MD, PhD. It was created on their behalf by Almetta Pesa, an ophthalmic technician. The creation of this record is the provider's dictation and/or activities during the visit.    Electronically signed by: Almetta Pesa, OA, 02/18/24  2:45 AM  Redell JUDITHANN Hans, M.D., Ph.D. Diseases & Surgery of the Retina and Vitreous [facility]  I have reviewed the above documentation for accuracy and completeness, and I agree with the above. Redell JUDITHANN Hans, M.D., Ph.D. 02/18/24 2:46 AM   Abbreviations: M myopia (nearsighted); A astigmatism; H hyperopia (farsighted); P presbyopia; Mrx spectacle  prescription;  CTL contact lenses; OD right eye; OS left eye; OU both eyes  XT exotropia; ET esotropia; PEK punctate epithelial keratitis; PEE punctate epithelial erosions; DES dry eye syndrome; MGD meibomian gland dysfunction; ATs artificial tears; PFAT's preservative free artificial tears; NSC nuclear sclerotic cataract; PSC posterior subcapsular cataract; ERM epi-retinal membrane; PVD posterior vitreous detachment; RD retinal detachment; DM diabetes mellitus; DR diabetic retinopathy; NPDR non-proliferative diabetic retinopathy; PDR proliferative diabetic retinopathy; CSME clinically significant macular edema; DME diabetic macular edema; dbh dot blot hemorrhages; CWS cotton wool spot; POAG primary open angle glaucoma; C/D cup-to-disc ratio; HVF humphrey visual field; GVF goldmann visual field; OCT optical coherence tomography; IOP intraocular pressure; BRVO Branch retinal vein occlusion; CRVO central retinal vein occlusion; CRAO central retinal artery occlusion; BRAO branch retinal artery occlusion; RT retinal tear; SB scleral buckle; PPV pars plana vitrectomy; VH Vitreous hemorrhage; PRP panretinal laser photocoagulation; IVK intravitreal kenalog; VMT vitreomacular traction; MH Macular hole;  NVD neovascularization of the disc; NVE neovascularization elsewhere; AREDS age related eye disease study; ARMD age related macular degeneration; POAG primary open angle glaucoma; EBMD epithelial/anterior basement membrane dystrophy; ACIOL anterior chamber intraocular lens; IOL intraocular lens; PCIOL posterior chamber intraocular lens; Phaco/IOL phacoemulsification with intraocular lens placement; PRK photorefractive keratectomy; LASIK laser assisted in situ keratomileusis; HTN hypertension; DM diabetes mellitus; COPD chronic obstructive pulmonary disease

## 2024-02-01 ENCOUNTER — Ambulatory Visit: Admitting: Cardiology

## 2024-02-01 ENCOUNTER — Encounter: Payer: Self-pay | Admitting: Cardiology

## 2024-02-01 VITALS — BP 110/60 | HR 64 | Ht 70.0 in | Wt 180.0 lb

## 2024-02-01 DIAGNOSIS — R002 Palpitations: Secondary | ICD-10-CM | POA: Diagnosis not present

## 2024-02-01 NOTE — Patient Instructions (Addendum)

## 2024-02-13 ENCOUNTER — Encounter (INDEPENDENT_AMBULATORY_CARE_PROVIDER_SITE_OTHER): Payer: Self-pay | Admitting: Ophthalmology

## 2024-02-13 ENCOUNTER — Ambulatory Visit (INDEPENDENT_AMBULATORY_CARE_PROVIDER_SITE_OTHER): Admitting: Ophthalmology

## 2024-02-13 DIAGNOSIS — E113313 Type 2 diabetes mellitus with moderate nonproliferative diabetic retinopathy with macular edema, bilateral: Secondary | ICD-10-CM

## 2024-02-13 DIAGNOSIS — I10 Essential (primary) hypertension: Secondary | ICD-10-CM | POA: Diagnosis not present

## 2024-02-13 DIAGNOSIS — H35033 Hypertensive retinopathy, bilateral: Secondary | ICD-10-CM | POA: Diagnosis not present

## 2024-02-13 DIAGNOSIS — Z7984 Long term (current) use of oral hypoglycemic drugs: Secondary | ICD-10-CM | POA: Diagnosis not present

## 2024-02-13 DIAGNOSIS — H25813 Combined forms of age-related cataract, bilateral: Secondary | ICD-10-CM

## 2024-02-18 ENCOUNTER — Encounter (INDEPENDENT_AMBULATORY_CARE_PROVIDER_SITE_OTHER): Payer: Self-pay | Admitting: Ophthalmology

## 2024-02-18 MED ORDER — BEVACIZUMAB CHEMO INJECTION 1.25MG/0.05ML SYRINGE FOR KALEIDOSCOPE
1.2500 mg | INTRAVITREAL | Status: AC | PRN
  Administered 2024-02-18: 1.25 mg via INTRAVITREAL

## 2024-02-29 NOTE — Progress Notes (Signed)
 [facility] - Clinic Note  03/12/2024     CHIEF COMPLAINT Patient presents for Retina Follow Up   HISTORY OF PRESENT ILLNESS: Arthur Torres is a 73 y.o. male who presents to the clinic today for:   HPI     Retina Follow Up   Patient presents with  Diabetic Retinopathy.  In both eyes.  This started 4 weeks ago.        Comments   Patient here for 4 weeks retina follow up for NPDR OU. Patient states vision can't tell. No eye pain.      Last edited by Orval Asberry RAMAN, COA on 03/12/2024 12:49 PM.      Pt states everything seems stable.   Referring physician: Gladis Mustard, FNP [address] [place],  [place] [address]  HISTORICAL INFORMATION:   Selected notes from the MEDICAL RECORD NUMBER Referred by Dr. Ross Daniels for DM exam LEE:  Ocular Hx- PMH-    CURRENT MEDICATIONS: No current outpatient medications on file. (Ophthalmic Drugs)   No current facility-administered medications for this visit. (Ophthalmic Drugs)   Current Outpatient Medications (Other)  Medication Sig   aspirin EC 81 MG tablet Take 81 mg by mouth daily. Swallow whole.   Aspirin-Acetaminophen (GOODYS BODY PAIN PO) Take 1 Package by mouth 2 (two) times daily as needed (pain).   glipiZIDE  (GLUCOTROL ) 5 MG tablet TAKE 1 TABLET (5 MG TOTAL) BY MOUTH TWICE A DAY BEFORE MEALS   metoprolol  succinate (TOPROL -XL) 50 MG 24 hr tablet Take 1 tablet (50 mg total) by mouth daily. Take with or immediately following a meal. (Patient taking differently: Take 25 mg by mouth daily. Take with or immediately following a meal.)   pantoprazole  (PROTONIX ) 40 MG tablet Take 1 tablet (40 mg total) by mouth daily.   rosuvastatin  (CRESTOR ) 10 MG tablet Take 1 tablet (10 mg total) by mouth daily.   sitaGLIPtin -metformin  (JANUMET ) 50-1000 MG tablet Take 1 tablet by mouth 2 (two) times daily with a meal.   No current facility-administered medications for this visit. (Other)   REVIEW OF  SYSTEMS: ROS   Positive for: Endocrine, Eyes Negative for: Constitutional, Gastrointestinal, Neurological, Skin, Genitourinary, Musculoskeletal, HENT, Cardiovascular, Respiratory, Psychiatric, Allergic/Imm, Heme/Lymph Last edited by Orval Asberry RAMAN, COA on 03/12/2024 12:49 PM.        ALLERGIES Allergies  Allergen Reactions   Penicillins Rash    Childhood allergy - rash all over Unknown if more reactions were occuring   Zocor [Simvastatin] Other (See Comments)    Joint pain and tightness    PAST MEDICAL HISTORY Past Medical History:  Diagnosis Date   CAD (coronary artery disease)    1999 occluded LAD treated with PCI and stenting, 80% stenosis of the first diagonal feeling angioplasty, 60-70% second stenosis, 50% ostial left main stenosis, 25% right coronary artery stenosis, 50% ostial PDA stenosis.   Cancer [facility])    prostate (treated with radiation)   COVID-19    Diabetes mellitus without complication (HCC)    Hyperlipidemia    Hypertension    MI, old    51   Past Surgical History:  Procedure Laterality Date   CARDIAC CATHETERIZATION     COLON SURGERY     hemrrhoids   CORONARY ANGIOPLASTY WITH STENT PLACEMENT     FAMILY HISTORY Family History  Problem Relation Age of Onset   Diabetes Mother    Stroke Mother    Cancer Father        skin,  prostate, stomach   Healthy Daughter    Healthy Son    Asthma Son    SOCIAL HISTORY Social History   Tobacco Use   Smoking status: Former    Current packs/day: 0.00    Average packs/day: 2.0 packs/day for 25.7 years (51.5 ttl pk-yrs)    Types: Cigarettes    Start date: 12    Quit date: 03/28/1998    Years since quitting: 25.9    Passive exposure: Past   Smokeless tobacco: Never  Vaping Use   Vaping status: Never Used  Substance Use Topics   Alcohol use: No   Drug use: No       OPHTHALMIC EXAM:  Base Eye Exam     Visual Acuity (Snellen - Linear)       Right Left   Dist cc 20/50 -1 20/25 +1   Dist ph cc  20/40 -2          Tonometry (Tonopen, 12:47 PM)       Right Left   Pressure 18 16         Pupils       Dark Light Shape React APD   Right 3 2 Round Brisk None   Left 3 2 Round Brisk None         Visual Fields (Counting fingers)       Left Right    Full Full         Extraocular Movement       Right Left    Full, Ortho Full, Ortho         Neuro/Psych     Oriented x3: Yes   Mood/Affect: Normal         Dilation     Both eyes: 1.0% Mydriacyl, 2.5% Phenylephrine @ 12:47 PM           Slit Lamp and Fundus Exam     Slit Lamp Exam       Right Left   Lids/Lashes Dermatochalasis - upper lid, Meibomian gland dysfunction Dermatochalasis - upper lid, Meibomian gland dysfunction   Conjunctiva/Sclera White and quiet nasal and temporal pinguecula   Cornea arcus Trace tear film debris   Anterior Chamber deep and clear deep and clear   Iris Round and dilated, No NVI Round and dilated, No NVI   Lens 2+ Nuclear sclerosis with brunescence, 2+ Cortical cataract 2+ Nuclear sclerosis with brunescence, 2+ Cortical cataract   Anterior Vitreous mild syneresis mild syneresis         Fundus Exam       Right Left   Disc Pink and Sharp, +PPP Pink and Sharp, mild PPP   C/D Ratio 0.2 0.2   Macula Blunted foveal reflex, central edema / cystic changes, scattered MA/DBH greatest temporal mac Flat, Blunted foveal reflex, scattered MA, trace cystic changes temporal fovea   Vessels attenuated, Tortuous attenuated, mild tortuosity   Periphery Attached, 360 MA / DBH -- improved Attached, rare MA           Refraction     Wearing Rx       Sphere Cylinder Axis Add   Right -1.50 +0.75 114 +2.50   Left -1.50 +0.25 163 +2.50            IMAGING AND PROCEDURES  Imaging and Procedures for 03/12/2024  OCT, Retina - OU - Both Eyes       Right Eye Quality was good. Central Foveal Thickness: 327. Progression has improved. Findings include no SRF, abnormal foveal  contour, intraretinal hyper-reflective material, intraretinal fluid, vitreomacular adhesion (Persistent central IRF/IRHM--slightly improved).   Left Eye Quality was good. Central Foveal Thickness: 329. Progression has improved. Findings include normal foveal contour, no SRF, intraretinal hyper-reflective material, intraretinal fluid (Trace persistent focal cystic changes temporal fovea and mac --slightly improved).   Notes *Images captured and stored on drive  Diagnosis / Impression:  OD: +DME/CME - Persistent central IRF/IRHM--slightly improved OS: Trace persistent focal cystic changes temporal fovea and mac -- slightly improved  Clinical management:  See below  Abbreviations: NFP - Normal foveal profile. CME - cystoid macular edema. PED - pigment epithelial detachment. IRF - intraretinal fluid. SRF - subretinal fluid. EZ - ellipsoid zone. ERM - epiretinal membrane. ORA - outer retinal atrophy. ORT - outer retinal tubulation. SRHM - subretinal hyper-reflective material. IRHM - intraretinal hyper-reflective material               ASSESSMENT/PLAN:    ICD-10-CM   1. Moderate nonproliferative diabetic retinopathy of both eyes with macular edema associated with type 2 diabetes mellitus (HCC)  E11.3313 OCT, Retina - OU - Both Eyes    2. Long term (current) use of oral hypoglycemic drugs  Z79.84     3. Essential hypertension  I10     4. Hypertensive retinopathy of both eyes  H35.033     5. Combined forms of age-related cataract of both eyes  H25.813       1,2. Moderate Non-proliferative diabetic retinopathy, both eyes (OD>OS)  - A1c 5.6 (01.14.25), 5.8 (07.18.25) - exam shows scattered MA OU (OD > OS) - s/p IVA OD #1 (04.08.24), #2 (05.06.24), #3 (06.03.24), #4 (07.01.24), #5 (07.29.24), #6 (08.26.24), #7 (01.27.25), #8 (02.24.25), #9 (03.24.25), #10 (04.21.25), #11 (05.20.25), #12 (06.17.25), #13 (07.10.25), #14 (08.18.25)  ===================== - s/p IVE OD #1 (09.27.24), #2  (10.28.24), #3 (11.25.24), #4 (12.30.24) - FA (04.08.24) shows OD: scattered leaking MA greatest centrally; no NV; Large area of vascular non-perfusion temporal periphery with +perivascular leakage; OS: Scattered MA with late leakage, no NV - BCVA OD 20/40 from 20/60, OS 20/50 from 20/30 - OCT shows OD: +DME/CME - Persistent central IRF/IRHM--slightly improved; OS: Trace persistent focal cystic changes temporal fovea and mac -- slightly improved at 4 weeks - **discussed decreased efficacy / resistance to Avastin  and potential benefit of switching medication** - recommend IVA OD #15 (09.15.25) today for DME w/ f/u in 4 wks - pt wishes to proceed - RBA of procedure discussed, questions answered - IVA informed consent obtained and signed, 01.27.25 (OD) - see procedure note - Eylea  approved for 2025 -- but [employer] funding unavailable - f/u in 4 wks -- DFE/OCT, possible injection(s)  3,4. Hypertensive retinopathy OU - discussed importance of tight BP control - possible RVO/CME component to macular edema OD - monitor  5. Mixed Cataract OU - The symptoms of cataract, surgical options, and treatments and risks were discussed with patient. - discussed diagnosis and progression - referred to [facility] [place] for consult -- pt is deferring consult for now  Ophthalmic Meds Ordered this visit:  No orders of the defined types were placed in this encounter.    No follow-ups on file.  There are no Patient Instructions on file for this visit.  Explained the diagnoses, plan, and follow up with the patient and they expressed understanding.  Patient expressed understanding of the importance of proper follow up care.   This document serves as a record of services personally performed by Redell JUDITHANN Hans, MD,  PhD. It was created on their behalf by Avelina Pereyra, COA an ophthalmic technician. The creation of this record is the provider's dictation and/or activities during the visit.    Electronically signed by: Avelina GORMAN Pereyra, COT  03/12/24  12:56 PM    This document serves as a record of services personally performed by Redell JUDITHANN Hans, MD, PhD. It was created on their behalf by Wanda GEANNIE Keens, COT an ophthalmic technician. The creation of this record is the provider's dictation and/or activities during the visit.    Electronically signed by:  Wanda GEANNIE Keens, COT  03/12/24 12:56 PM   Redell JUDITHANN Hans, M.D., Ph.D. Diseases & Surgery of the Retina and Vitreous [facility]    Abbreviations: M myopia (nearsighted); A astigmatism; H hyperopia (farsighted); P presbyopia; Mrx spectacle prescription;  CTL contact lenses; OD right eye; OS left eye; OU both eyes  XT exotropia; ET esotropia; PEK punctate epithelial keratitis; PEE punctate epithelial erosions; DES dry eye syndrome; MGD meibomian gland dysfunction; ATs artificial tears; PFAT's preservative free artificial tears; NSC nuclear sclerotic cataract; PSC posterior subcapsular cataract; ERM epi-retinal membrane; PVD posterior vitreous detachment; RD retinal detachment; DM diabetes mellitus; DR diabetic retinopathy; NPDR non-proliferative diabetic retinopathy; PDR proliferative diabetic retinopathy; CSME clinically significant macular edema; DME diabetic macular edema; dbh dot blot hemorrhages; CWS cotton wool spot; POAG primary open angle glaucoma; C/D cup-to-disc ratio; HVF humphrey visual field; GVF goldmann visual field; OCT optical coherence tomography; IOP intraocular pressure; BRVO Branch retinal vein occlusion; CRVO central retinal vein occlusion; CRAO central retinal artery occlusion; BRAO branch retinal artery occlusion; RT retinal tear; SB scleral buckle; PPV pars plana vitrectomy; VH Vitreous hemorrhage; PRP panretinal laser photocoagulation; IVK intravitreal kenalog; VMT vitreomacular traction; MH Macular hole;  NVD neovascularization of the disc; NVE neovascularization elsewhere; AREDS  age related eye disease study; ARMD age related macular degeneration; POAG primary open angle glaucoma; EBMD epithelial/anterior basement membrane dystrophy; ACIOL anterior chamber intraocular lens; IOL intraocular lens; PCIOL posterior chamber intraocular lens; Phaco/IOL phacoemulsification with intraocular lens placement; PRK photorefractive keratectomy; LASIK laser assisted in situ keratomileusis; HTN hypertension; DM diabetes mellitus; COPD chronic obstructive pulmonary disease

## 2024-03-12 ENCOUNTER — Encounter (INDEPENDENT_AMBULATORY_CARE_PROVIDER_SITE_OTHER): Payer: Self-pay | Admitting: Ophthalmology

## 2024-03-12 ENCOUNTER — Ambulatory Visit (INDEPENDENT_AMBULATORY_CARE_PROVIDER_SITE_OTHER): Admitting: Ophthalmology

## 2024-03-12 DIAGNOSIS — I10 Essential (primary) hypertension: Secondary | ICD-10-CM | POA: Diagnosis not present

## 2024-03-12 DIAGNOSIS — Z7984 Long term (current) use of oral hypoglycemic drugs: Secondary | ICD-10-CM

## 2024-03-12 DIAGNOSIS — H35033 Hypertensive retinopathy, bilateral: Secondary | ICD-10-CM | POA: Diagnosis not present

## 2024-03-12 DIAGNOSIS — H25813 Combined forms of age-related cataract, bilateral: Secondary | ICD-10-CM

## 2024-03-12 DIAGNOSIS — E113313 Type 2 diabetes mellitus with moderate nonproliferative diabetic retinopathy with macular edema, bilateral: Secondary | ICD-10-CM

## 2024-03-13 ENCOUNTER — Encounter (INDEPENDENT_AMBULATORY_CARE_PROVIDER_SITE_OTHER): Payer: Self-pay | Admitting: Ophthalmology

## 2024-03-13 MED ORDER — BEVACIZUMAB CHEMO INJECTION 1.25MG/0.05ML SYRINGE FOR KALEIDOSCOPE
1.2500 mg | INTRAVITREAL | Status: AC | PRN
  Administered 2024-03-13: 1.25 mg via INTRAVITREAL

## 2024-04-02 NOTE — Progress Notes (Signed)
 [facility] - Clinic Note  04/16/2024     CHIEF COMPLAINT Patient presents for Retina Follow Up   HISTORY OF PRESENT ILLNESS: Arthur Torres is a 73 y.o. male who presents to the clinic today for:   HPI     Retina Follow Up   Patient presents with  Diabetic Retinopathy.  In both eyes.  This started 4 weeks ago.  I, the attending physician,  performed the HPI with the patient and updated documentation appropriately.        Comments   Pt states it seems vision in his OD is more blurry. Pt states he noticed 2 flashes since his last visit but they have not reoccurred. Pt typically has a floater or 2 after injection but this time he has a blotch that is still popping up. Pt denies any pain. A1c=5.8, 2 mo ago. Dr told him he only needs to check A1c every 6 months now instead of every 42mo.       Last edited by Valdemar Rogue, MD on 04/16/2024  1:09 PM.     Patient states the vision is about the same.   Referring physician: Gladis Mustard, FNP [address] [place],  [place] [address]  HISTORICAL INFORMATION:   Selected notes from the MEDICAL RECORD NUMBER Referred by Dr. Ross Daniels for DM exam LEE:  Ocular Hx- PMH-    CURRENT MEDICATIONS: No current outpatient medications on file. (Ophthalmic Drugs)   No current facility-administered medications for this visit. (Ophthalmic Drugs)   Current Outpatient Medications (Other)  Medication Sig   aspirin EC 81 MG tablet Take 81 mg by mouth daily. Swallow whole.   Aspirin-Acetaminophen (GOODYS BODY PAIN PO) Take 1 Package by mouth 2 (two) times daily as needed (pain).   glipiZIDE  (GLUCOTROL ) 5 MG tablet TAKE 1 TABLET (5 MG TOTAL) BY MOUTH TWICE A Torres BEFORE MEALS   metoprolol  succinate (TOPROL -XL) 50 MG 24 hr tablet Take 1 tablet (50 mg total) by mouth daily. Take with or immediately following a meal. (Patient taking differently: Take 25 mg by mouth daily. Take with or immediately following a meal.)    pantoprazole  (PROTONIX ) 40 MG tablet Take 1 tablet (40 mg total) by mouth daily.   rosuvastatin  (CRESTOR ) 10 MG tablet Take 1 tablet (10 mg total) by mouth daily.   sitaGLIPtin -metformin  (JANUMET ) 50-1000 MG tablet Take 1 tablet by mouth 2 (two) times daily with a meal.   No current facility-administered medications for this visit. (Other)   REVIEW OF SYSTEMS: ROS   Positive for: Endocrine, Eyes Negative for: Constitutional, Gastrointestinal, Neurological, Skin, Genitourinary, Musculoskeletal, HENT, Cardiovascular, Respiratory, Psychiatric, Allergic/Imm, Heme/Lymph Last edited by Elnor Avelina RAMAN, COT on 04/16/2024 12:30 PM.         ALLERGIES Allergies  Allergen Reactions   Penicillins Rash    Childhood allergy - rash all over Unknown if more reactions were occuring   Zocor [Simvastatin] Other (See Comments)    Joint pain and tightness    PAST MEDICAL HISTORY Past Medical History:  Diagnosis Date   CAD (coronary artery disease)    1999 occluded LAD treated with PCI and stenting, 80% stenosis of the first diagonal feeling angioplasty, 60-70% second stenosis, 50% ostial left main stenosis, 25% right coronary artery stenosis, 50% ostial PDA stenosis.   Cancer [facility])    prostate (treated with radiation)   COVID-19    Diabetes mellitus without complication (HCC)    Hyperlipidemia    Hypertension  MI, old    89   Past Surgical History:  Procedure Laterality Date   CARDIAC CATHETERIZATION     COLON SURGERY     hemrrhoids   CORONARY ANGIOPLASTY WITH STENT PLACEMENT     FAMILY HISTORY Family History  Problem Relation Age of Onset   Diabetes Mother    Stroke Mother    Cancer Father        skin, prostate, stomach   Healthy Daughter    Healthy Son    Asthma Son    SOCIAL HISTORY Social History   Tobacco Use   Smoking status: Former    Current packs/Torres: 0.00    Average packs/Torres: 2.0 packs/Torres for 25.7 years (51.5 ttl pk-yrs)    Types: Cigarettes    Start  date: 72    Quit date: 03/28/1998    Years since quitting: 26.0    Passive exposure: Past   Smokeless tobacco: Never  Vaping Use   Vaping status: Never Used  Substance Use Topics   Alcohol use: No   Drug use: No       OPHTHALMIC EXAM:  Base Eye Exam     Visual Acuity (Snellen - Linear)       Right Left   Dist cc 20/80 +1 20/25 +2   Dist ph cc 20/50 -2 20/20 -1    Correction: Glasses         Tonometry (Tonopen, 12:36 PM)       Right Left   Pressure 21 17         Pupils       Pupils Dark Light Shape React APD   Right PERRL 4 2 Round Brisk None   Left PERRL 4 2 Round Brisk None         Visual Fields       Left Right    Full Full         Extraocular Movement       Right Left    Full, Ortho Full, Ortho         Neuro/Psych     Oriented x3: Yes   Mood/Affect: Normal         Dilation     Both eyes: 1.0% Mydriacyl, 2.5% Phenylephrine @ 12:37 PM           Slit Lamp and Fundus Exam     Slit Lamp Exam       Right Left   Lids/Lashes Dermatochalasis - upper lid, Meibomian gland dysfunction Dermatochalasis - upper lid, Meibomian gland dysfunction   Conjunctiva/Sclera White and quiet nasal and temporal pinguecula   Cornea arcus Trace tear film debris   Anterior Chamber deep and clear deep and clear   Iris Round and dilated, No NVI Round and dilated, No NVI   Lens 2+ Nuclear sclerosis with brunescence, 2+ Cortical cataract 2+ Nuclear sclerosis with brunescence, 2+ Cortical cataract   Anterior Vitreous mild syneresis mild syneresis         Fundus Exam       Right Left   Disc Pink and Sharp, +PPP Pink and Sharp, mild PPP   C/D Ratio 0.2 0.2   Macula Blunted foveal reflex, central edema / cystic changes, scattered MA/DBH greatest temporal mac Flat, Blunted foveal reflex, scattered MA, trace cystic changes temporal fovea   Vessels attenuated, Tortuous attenuated, mild tortuosity   Periphery Attached, 360 MA / DBH -- improved Attached,  rare MA           Refraction  Wearing Rx       Sphere Cylinder Axis Add   Right -1.50 +0.75 114 +2.50   Left -1.50 +0.25 163 +2.50            IMAGING AND PROCEDURES  Imaging and Procedures for 04/16/2024  OCT, Retina - OU - Both Eyes       Right Eye Quality was good. Central Foveal Thickness: 333. Progression has been stable. Findings include no SRF, abnormal foveal contour, intraretinal hyper-reflective material, intraretinal fluid, vitreomacular adhesion (Persistent central IRF/IRHM).   Left Eye Quality was good. Central Foveal Thickness: 321. Progression has been stable. Findings include normal foveal contour, no SRF, intraretinal hyper-reflective material, intraretinal fluid (Trace persistent focal cystic changes temporal fovea and mac -- slightly improved).   Notes *Images captured and stored on drive  Diagnosis / Impression:  OD: +DME/CME - Persistent central IRF/IRHM OS: Trace persistent focal cystic changes temporal fovea and mac -- slightly improved  Clinical management:  See below  Abbreviations: NFP - Normal foveal profile. CME - cystoid macular edema. PED - pigment epithelial detachment. IRF - intraretinal fluid. SRF - subretinal fluid. EZ - ellipsoid zone. ERM - epiretinal membrane. ORA - outer retinal atrophy. ORT - outer retinal tubulation. SRHM - subretinal hyper-reflective material. IRHM - intraretinal hyper-reflective material      Intravitreal Injection, Pharmacologic Agent - OD - Right Eye       Time Out 04/16/2024. 12:54 PM. Confirmed correct patient, procedure, site, and patient consented.   Anesthesia Topical anesthesia was used. Anesthetic medications included Lidocaine 2%, Proparacaine 0.5%.   Procedure Preparation included 5% betadine to ocular surface, eyelid speculum. A supplied (32g) needle was used.   Injection: 1.25 mg Bevacizumab  1.25mg /0.54ml   Route: Intravitreal, Site: Right Eye   NDC: [id number], Lot: [id number],  Expiration date: 05/13/2024   Post-op Post injection exam found visual acuity of at least counting fingers. The patient tolerated the procedure well. There were no complications. The patient received written and verbal post procedure care education. Post injection medications were not given.            ASSESSMENT/PLAN:    ICD-10-CM   1. Moderate nonproliferative diabetic retinopathy of both eyes with macular edema associated with type 2 diabetes mellitus (HCC)  E11.3313 OCT, Retina - OU - Both Eyes    Intravitreal Injection, Pharmacologic Agent - OD - Right Eye    Bevacizumab  (AVASTIN ) SOLN 1.25 mg    2. Long term (current) use of oral hypoglycemic drugs  Z79.84     3. Essential hypertension  I10     4. Hypertensive retinopathy of both eyes  H35.033     5. Combined forms of age-related cataract of both eyes  H25.813      1,2. Moderate Non-proliferative diabetic retinopathy, both eyes (OD>OS)  - A1c 5.8 (07.18.25), 5.6 (01.14.25), 5.8 (07.18.25) - exam shows scattered MA OU (OD > OS) - s/p IVA OD #1 (04.08.24), #2 (05.06.24), #3 (06.03.24), #4 (07.01.24), #5 (07.29.24), #6 (08.26.24), #7 (01.27.25), #8 (02.24.25), #9 (03.24.25), #10 (04.21.25), #11 (05.20.25), #12 (06.17.25), #13 (07.10.25), #14 (08.18.25), #15 (09.15.25)  ===================== - s/p IVE OD #1 (09.27.24), #2 (10.28.24), #3 (11.25.24), #4 (12.30.24) - FA (04.08.24) shows OD: scattered leaking MA greatest centrally; no NV; Large area of vascular non-perfusion temporal periphery with +perivascular leakage; OS: Scattered MA with late leakage, no NV - BCVA OD 20/50 from 20/40, OS 20/20 from 20/25  - OCT shows OD: +DME/CME - Persistent central IRF/IRHM; OS: Trace persistent focal cystic changes  temporal fovea and mac -- slightly improved at 4 weeks **discussed decreased efficacy / resistance to Avastin  and potential benefit of switching medication** - recommend IVA today OD #16 (10.20.25) for DME w/ f/u in 4 wks - pt  wishes to proceed - RBA of procedure discussed, questions answered - IVA informed consent obtained and signed, 01.27.25 (OD) - see procedure note - Eylea  approved for 2025 -- but [employer] funding unavailable - f/u in 4 wks -- DFE/OCT, possible injection(s)  3,4. Hypertensive retinopathy OU - discussed importance of tight BP control - possible RVO/CME component to macular edema OD - monitor  5. Mixed Cataract OU - The symptoms of cataract, surgical options, and treatments and risks were discussed with patient. - discussed diagnosis and progression - referred to [facility] --  for consult -- pt is deferring consult for now  Ophthalmic Meds Ordered this visit:  Meds ordered this encounter  Medications   Bevacizumab  (AVASTIN ) SOLN 1.25 mg     Return in about 4 weeks (around 05/14/2024) for NPDR, DFE, OCT, Possible, IVA, OD.  There are no Patient Instructions on file for this visit.  Explained the diagnoses, plan, and follow up with the patient and they expressed understanding.  Patient expressed understanding of the importance of proper follow up care.   This document serves as a record of services personally performed by Redell JUDITHANN Hans, MD, PhD. It was created on their behalf by Avelina Pereyra, COA an ophthalmic technician. The creation of this record is the provider's dictation and/or activities during the visit.   Electronically signed by: Avelina GORMAN Pereyra, COT  04/16/24  1:09 PM   This document serves as a record of services personally performed by Redell JUDITHANN Hans, MD, PhD. It was created on their behalf by Wanda GEANNIE Keens, COT an ophthalmic technician. The creation of this record is the provider's dictation and/or activities during the visit.    Electronically signed by:  Wanda GEANNIE Keens, COT  04/16/24 1:09 PM  Redell JUDITHANN Hans, M.D., Ph.D. Diseases & Surgery of the Retina and Vitreous [facility]  I have reviewed the above  documentation for accuracy and completeness, and I agree with the above. Redell JUDITHANN Hans, M.D., Ph.D. 04/16/24 1:10 PM   Abbreviations: M myopia (nearsighted); A astigmatism; H hyperopia (farsighted); P presbyopia; Mrx spectacle prescription;  CTL contact lenses; OD right eye; OS left eye; OU both eyes  XT exotropia; ET esotropia; PEK punctate epithelial keratitis; PEE punctate epithelial erosions; DES dry eye syndrome; MGD meibomian gland dysfunction; ATs artificial tears; PFAT's preservative free artificial tears; NSC nuclear sclerotic cataract; PSC posterior subcapsular cataract; ERM epi-retinal membrane; PVD posterior vitreous detachment; RD retinal detachment; DM diabetes mellitus; DR diabetic retinopathy; NPDR non-proliferative diabetic retinopathy; PDR proliferative diabetic retinopathy; CSME clinically significant macular edema; DME diabetic macular edema; dbh dot blot hemorrhages; CWS cotton wool spot; POAG primary open angle glaucoma; C/D cup-to-disc ratio; HVF humphrey visual field; GVF goldmann visual field; OCT optical coherence tomography; IOP intraocular pressure; BRVO Branch retinal vein occlusion; CRVO central retinal vein occlusion; CRAO central retinal artery occlusion; BRAO branch retinal artery occlusion; RT retinal tear; SB scleral buckle; PPV pars plana vitrectomy; VH Vitreous hemorrhage; PRP panretinal laser photocoagulation; IVK intravitreal kenalog; VMT vitreomacular traction; MH Macular hole;  NVD neovascularization of the disc; NVE neovascularization elsewhere; AREDS age related eye disease study; ARMD age related macular degeneration; POAG primary open angle glaucoma; EBMD epithelial/anterior basement membrane dystrophy; ACIOL anterior chamber intraocular lens;  IOL intraocular lens; PCIOL posterior chamber intraocular lens; Phaco/IOL phacoemulsification with intraocular lens placement; PRK photorefractive keratectomy; LASIK laser assisted in situ keratomileusis; HTN hypertension; DM  diabetes mellitus; COPD chronic obstructive pulmonary disease

## 2024-04-16 ENCOUNTER — Encounter (INDEPENDENT_AMBULATORY_CARE_PROVIDER_SITE_OTHER): Payer: Self-pay | Admitting: Ophthalmology

## 2024-04-16 ENCOUNTER — Ambulatory Visit (INDEPENDENT_AMBULATORY_CARE_PROVIDER_SITE_OTHER): Admitting: Ophthalmology

## 2024-04-16 DIAGNOSIS — H35033 Hypertensive retinopathy, bilateral: Secondary | ICD-10-CM

## 2024-04-16 DIAGNOSIS — I10 Essential (primary) hypertension: Secondary | ICD-10-CM | POA: Diagnosis not present

## 2024-04-16 DIAGNOSIS — E113313 Type 2 diabetes mellitus with moderate nonproliferative diabetic retinopathy with macular edema, bilateral: Secondary | ICD-10-CM

## 2024-04-16 DIAGNOSIS — Z7984 Long term (current) use of oral hypoglycemic drugs: Secondary | ICD-10-CM

## 2024-04-16 DIAGNOSIS — H25813 Combined forms of age-related cataract, bilateral: Secondary | ICD-10-CM

## 2024-04-16 MED ORDER — BEVACIZUMAB CHEMO INJECTION 1.25MG/0.05ML SYRINGE FOR KALEIDOSCOPE
1.2500 mg | INTRAVITREAL | Status: AC | PRN
  Administered 2024-04-16: 1.25 mg via INTRAVITREAL

## 2024-05-01 NOTE — Progress Notes (Signed)
 [facility] - Clinic Note  05/14/2024     CHIEF COMPLAINT Patient presents for Retina Follow Up   HISTORY OF PRESENT ILLNESS: Arthur Torres is a 73 y.o. male who presents to the clinic today for:   HPI     Retina Follow Up   Patient presents with  Diabetic Retinopathy.  In both eyes.  This started 4 weeks ago.  Duration of 4 weeks.  Since onset it is stable.  I, the attending physician,  performed the HPI with the patient and updated documentation appropriately.        Comments   4 week retina follow up NPDR pt reports vision is about the same he denies any flashes has some floaters he is not checking his blood sugar last A1C 5.8       Last edited by Valdemar Rogue, MD on 05/14/2024  3:41 PM.     Patient states the eyes are dry. And he tested worse on the eye chart.  Referring physician: Gladis Mustard, FNP [address] [place],  [place] [address]  HISTORICAL INFORMATION:   Selected notes from the MEDICAL RECORD NUMBER Referred by Dr. Ross Daniels for DM exam LEE:  Ocular Hx- PMH-    CURRENT MEDICATIONS: No current outpatient medications on file. (Ophthalmic Drugs)   No current facility-administered medications for this visit. (Ophthalmic Drugs)   Current Outpatient Medications (Other)  Medication Sig   aspirin EC 81 MG tablet Take 81 mg by mouth daily. Swallow whole.   Aspirin-Acetaminophen (GOODYS BODY PAIN PO) Take 1 Package by mouth 2 (two) times daily as needed (pain).   glipiZIDE  (GLUCOTROL ) 5 MG tablet TAKE 1 TABLET (5 MG TOTAL) BY MOUTH TWICE A DAY BEFORE MEALS   metoprolol  succinate (TOPROL -XL) 50 MG 24 hr tablet Take 1 tablet (50 mg total) by mouth daily. Take with or immediately following a meal. (Patient taking differently: Take 25 mg by mouth daily. Take with or immediately following a meal.)   pantoprazole  (PROTONIX ) 40 MG tablet Take 1 tablet (40 mg total) by mouth daily.   rosuvastatin  (CRESTOR ) 10 MG tablet Take 1 tablet  (10 mg total) by mouth daily.   sitaGLIPtin -metformin  (JANUMET ) 50-1000 MG tablet TAKE 1 TABLET BY MOUTH TWICE A DAY WITH A MEAL   No current facility-administered medications for this visit. (Other)   REVIEW OF SYSTEMS: ROS   Positive for: Endocrine, Eyes Negative for: Constitutional, Gastrointestinal, Neurological, Skin, Genitourinary, Musculoskeletal, HENT, Cardiovascular, Respiratory, Psychiatric, Allergic/Imm, Heme/Lymph Last edited by Resa Delon ORN, COT on 05/14/2024 12:33 PM.          ALLERGIES Allergies  Allergen Reactions   Penicillins Rash    Childhood allergy - rash all over Unknown if more reactions were occuring   Zocor [Simvastatin] Other (See Comments)    Joint pain and tightness    PAST MEDICAL HISTORY Past Medical History:  Diagnosis Date   CAD (coronary artery disease)    1999 occluded LAD treated with PCI and stenting, 80% stenosis of the first diagonal feeling angioplasty, 60-70% second stenosis, 50% ostial left main stenosis, 25% right coronary artery stenosis, 50% ostial PDA stenosis.   Cancer [facility])    prostate (treated with radiation)   COVID-19    Diabetes mellitus without complication (HCC)    Hyperlipidemia    Hypertension    MI, old    77   Past Surgical History:  Procedure Laterality Date   CARDIAC CATHETERIZATION     COLON SURGERY  hemrrhoids   CORONARY ANGIOPLASTY WITH STENT PLACEMENT     FAMILY HISTORY Family History  Problem Relation Age of Onset   Diabetes Mother    Stroke Mother    Cancer Father        skin, prostate, stomach   Healthy Daughter    Healthy Son    Asthma Son    SOCIAL HISTORY Social History   Tobacco Use   Smoking status: Former    Current packs/day: 0.00    Average packs/day: 2.0 packs/day for 25.7 years (51.5 ttl pk-yrs)    Types: Cigarettes    Start date: 22    Quit date: 03/28/1998    Years since quitting: 26.1    Passive exposure: Past   Smokeless tobacco: Never  Vaping Use    Vaping status: Never Used  Substance Use Topics   Alcohol use: No   Drug use: No       OPHTHALMIC EXAM:  Base Eye Exam     Visual Acuity (Snellen - Linear)       Right Left   Dist cc 20/80 -2 20/30 -2   Dist ph cc 20/60 -3 20/25 -2         Tonometry (Tonopen, 12:39 PM)       Right Left   Pressure 18 17         Pupils       Pupils Dark Light Shape React APD   Right PERRL 3 2 Round Brisk None   Left PERRL 3 2 Round Brisk None         Visual Fields       Left Right    Full Full         Extraocular Movement       Right Left    Full, Ortho Full, Ortho         Neuro/Psych     Oriented x3: Yes   Mood/Affect: Normal         Dilation     Both eyes: 2.5% Phenylephrine @ 12:39 PM           Slit Lamp and Fundus Exam     Slit Lamp Exam       Right Left   Lids/Lashes Dermatochalasis - upper lid, Meibomian gland dysfunction Dermatochalasis - upper lid, Meibomian gland dysfunction   Conjunctiva/Sclera White and quiet nasal and temporal pinguecula   Cornea arcus Trace tear film debris   Anterior Chamber deep and clear deep and clear   Iris Round and dilated, No NVI Round and dilated, No NVI   Lens 2+ Nuclear sclerosis with brunescence, 2+ Cortical cataract 2+ Nuclear sclerosis with brunescence, 2+ Cortical cataract   Anterior Vitreous mild syneresis mild syneresis         Fundus Exam       Right Left   Disc Pink and Sharp, +PPP Pink and Sharp, mild PPP   C/D Ratio 0.2 0.2   Macula Blunted foveal reflex, central edema / cystic changes, scattered MA/DBH greatest temporal mac Flat, Blunted foveal reflex, scattered MA, trace cystic changes temporal fovea   Vessels attenuated, Tortuous attenuated, mild tortuosity   Periphery Attached, 360 MA / DBH -- improved Attached, rare MA           Refraction     Wearing Rx       Sphere Cylinder Axis Add   Right -1.50 +0.75 114 +2.50   Left -1.50 +0.25 163 +2.50  IMAGING AND  PROCEDURES  Imaging and Procedures for 05/14/2024  OCT, Retina - OU - Both Eyes       Right Eye Quality was good. Central Foveal Thickness: 327. Progression has been stable. Findings include no SRF, abnormal foveal contour, intraretinal hyper-reflective material, intraretinal fluid, vitreomacular adhesion (Persistent central IRF/IRHM-- slightly improved).   Left Eye Quality was good. Central Foveal Thickness: 317. Progression has been stable. Findings include normal foveal contour, no SRF, intraretinal hyper-reflective material, intraretinal fluid (Trace persistent focal cystic changes temporal fovea and mac ).   Notes *Images captured and stored on drive  Diagnosis / Impression:  OD: +DME/CME - Persistent central IRF/IRHM-- slightly improved OS: Trace persistent focal cystic changes temporal fovea and mac  Clinical management:  See below  Abbreviations: NFP - Normal foveal profile. CME - cystoid macular edema. PED - pigment epithelial detachment. IRF - intraretinal fluid. SRF - subretinal fluid. EZ - ellipsoid zone. ERM - epiretinal membrane. ORA - outer retinal atrophy. ORT - outer retinal tubulation. SRHM - subretinal hyper-reflective material. IRHM - intraretinal hyper-reflective material      Intravitreal Injection, Pharmacologic Agent - OD - Right Eye       Time Out 05/14/2024. 1:02 PM. Confirmed correct patient, procedure, site, and patient consented.   Anesthesia Topical anesthesia was used. Anesthetic medications included Lidocaine 2%, Proparacaine 0.5%.   Procedure Preparation included 5% betadine to ocular surface, eyelid speculum. A supplied (32g) needle was used.   Injection: 1.25 mg Bevacizumab  1.25mg /0.3ml   Route: Intravitreal, Site: Right Eye   NDC: [id number], Lot: [id number]$ , Expiration date: 05/25/2024   Post-op Post injection exam found visual acuity of at least counting fingers. The patient tolerated the procedure well. There were no  complications. The patient received written and verbal post procedure care education. Post injection medications were not given.             ASSESSMENT/PLAN:    ICD-10-CM   1. Moderate nonproliferative diabetic retinopathy of both eyes with macular edema associated with type 2 diabetes mellitus (HCC)  E11.3313 OCT, Retina - OU - Both Eyes    Intravitreal Injection, Pharmacologic Agent - OD - Right Eye    Bevacizumab  (AVASTIN ) SOLN 1.25 mg    2. Long term (current) use of oral hypoglycemic drugs  Z79.84     3. Essential hypertension  I10     4. Hypertensive retinopathy of both eyes  H35.033     5. Combined forms of age-related cataract of both eyes  H25.813      1,2. Moderate Non-proliferative diabetic retinopathy, both eyes (OD>OS)  - A1c 5.8 (07.18.25), 5.6 (01.14.25), 5.8 (07.18.25) - exam shows scattered MA OU (OD > OS) - s/p IVA OD #1 (04.08.24), #2 (05.06.24), #3 (06.03.24), #4 (07.01.24), #5 (07.29.24), #6 (08.26.24), #7 (01.27.25), #8 (02.24.25), #9 (03.24.25), #10 (04.21.25), #11 (05.20.25), #12 (06.17.25), #13 (07.10.25), #14 (08.18.25), #15 (09.15.25), #16 (10.20.25) ===================== - s/p IVE OD #1 (09.27.24), #2 (10.28.24), #3 (11.25.24), #4 (12.30.24) - FA (04.08.24) shows OD: scattered leaking MA greatest centrally; no NV; Large area of vascular non-perfusion temporal periphery with +perivascular leakage; OS: Scattered MA with late leakage, no NV - BCVA OD 20/60 from 20/50, OS 20/25 from 20/20  - OCT shows OD: +DME/CME - Persistent central IRF/IRHM-- slightly improved; OS: Trace persistent focal cystic changes temporal fovea and mac at 4 weeks - recommend IVA today OD #17 (11.17.25) for DME w/ f/u in 4 wks - pt wishes to proceed - RBA of procedure discussed, questions answered -  IVA informed consent obtained and signed, 01.27.25 (OD) - see procedure note - Eylea  approved for 2025 -- but [employer] funding unavailable, patient responsible for 20% - f/u in 4 wks  -- DFE/OCT, possible injection(s)  3,4. Hypertensive retinopathy OU - discussed importance of tight BP control - possible RVO/CME component to macular edema OD - monitor  5. Mixed Cataract OU - The symptoms of cataract, surgical options, and treatments and risks were discussed with patient. - discussed diagnosis and progression - referred to [facility] [place] for consult -- pt is deferring consult for now  Ophthalmic Meds Ordered this visit:  Meds ordered this encounter  Medications   Bevacizumab  (AVASTIN ) SOLN 1.25 mg     Return in about 4 weeks (around 06/11/2024) for f/u, NPDR, DFE, OCT, Possible, IVA, OD.  There are no Patient Instructions on file for this visit.  Explained the diagnoses, plan, and follow up with the patient and they expressed understanding.  Patient expressed understanding of the importance of proper follow up care.   This document serves as a record of services personally performed by Redell JUDITHANN Hans, MD, PhD. It was created on their behalf by Avelina Pereyra, COA an ophthalmic technician. The creation of this record is the provider's dictation and/or activities during the visit.   Electronically signed by: Avelina GORMAN Pereyra, COT  05/20/24  3:32 PM   This document serves as a record of services personally performed by Redell JUDITHANN Hans, MD, PhD. It was created on their behalf by Wanda GEANNIE Keens, COT an ophthalmic technician. The creation of this record is the provider's dictation and/or activities during the visit.    Electronically signed by:  Wanda GEANNIE Keens, COT  05/20/24 3:32 PM  Redell JUDITHANN Hans, M.D., Ph.D. Diseases & Surgery of the Retina and Vitreous [facility]  I have reviewed the above documentation for accuracy and completeness, and I agree with the above. Redell JUDITHANN Hans, M.D., Ph.D. 05/20/24 3:33 PM   Abbreviations: M myopia (nearsighted); A astigmatism; H hyperopia (farsighted); P presbyopia; Mrx  spectacle prescription;  CTL contact lenses; OD right eye; OS left eye; OU both eyes  XT exotropia; ET esotropia; PEK punctate epithelial keratitis; PEE punctate epithelial erosions; DES dry eye syndrome; MGD meibomian gland dysfunction; ATs artificial tears; PFAT's preservative free artificial tears; NSC nuclear sclerotic cataract; PSC posterior subcapsular cataract; ERM epi-retinal membrane; PVD posterior vitreous detachment; RD retinal detachment; DM diabetes mellitus; DR diabetic retinopathy; NPDR non-proliferative diabetic retinopathy; PDR proliferative diabetic retinopathy; CSME clinically significant macular edema; DME diabetic macular edema; dbh dot blot hemorrhages; CWS cotton wool spot; POAG primary open angle glaucoma; C/D cup-to-disc ratio; HVF humphrey visual field; GVF goldmann visual field; OCT optical coherence tomography; IOP intraocular pressure; BRVO Branch retinal vein occlusion; CRVO central retinal vein occlusion; CRAO central retinal artery occlusion; BRAO branch retinal artery occlusion; RT retinal tear; SB scleral buckle; PPV pars plana vitrectomy; VH Vitreous hemorrhage; PRP panretinal laser photocoagulation; IVK intravitreal kenalog; VMT vitreomacular traction; MH Macular hole;  NVD neovascularization of the disc; NVE neovascularization elsewhere; AREDS age related eye disease study; ARMD age related macular degeneration; POAG primary open angle glaucoma; EBMD epithelial/anterior basement membrane dystrophy; ACIOL anterior chamber intraocular lens; IOL intraocular lens; PCIOL posterior chamber intraocular lens; Phaco/IOL phacoemulsification with intraocular lens placement; PRK photorefractive keratectomy; LASIK laser assisted in situ keratomileusis; HTN hypertension; DM diabetes mellitus; COPD chronic obstructive pulmonary disease

## 2024-05-14 ENCOUNTER — Ambulatory Visit (INDEPENDENT_AMBULATORY_CARE_PROVIDER_SITE_OTHER): Admitting: Ophthalmology

## 2024-05-14 ENCOUNTER — Encounter (INDEPENDENT_AMBULATORY_CARE_PROVIDER_SITE_OTHER): Payer: Self-pay | Admitting: Ophthalmology

## 2024-05-14 DIAGNOSIS — Z7984 Long term (current) use of oral hypoglycemic drugs: Secondary | ICD-10-CM | POA: Diagnosis not present

## 2024-05-14 DIAGNOSIS — H25813 Combined forms of age-related cataract, bilateral: Secondary | ICD-10-CM

## 2024-05-14 DIAGNOSIS — E113313 Type 2 diabetes mellitus with moderate nonproliferative diabetic retinopathy with macular edema, bilateral: Secondary | ICD-10-CM | POA: Diagnosis not present

## 2024-05-14 DIAGNOSIS — H35033 Hypertensive retinopathy, bilateral: Secondary | ICD-10-CM | POA: Diagnosis not present

## 2024-05-14 DIAGNOSIS — I10 Essential (primary) hypertension: Secondary | ICD-10-CM | POA: Diagnosis not present

## 2024-05-14 MED ORDER — BEVACIZUMAB CHEMO INJECTION 1.25MG/0.05ML SYRINGE FOR KALEIDOSCOPE
1.2500 mg | INTRAVITREAL | Status: AC | PRN
  Administered 2024-05-14: 1.25 mg via INTRAVITREAL

## 2024-05-15 ENCOUNTER — Other Ambulatory Visit: Payer: Self-pay | Admitting: *Deleted

## 2024-05-15 DIAGNOSIS — E119 Type 2 diabetes mellitus without complications: Secondary | ICD-10-CM

## 2024-05-20 ENCOUNTER — Other Ambulatory Visit: Payer: Self-pay | Admitting: Nurse Practitioner

## 2024-05-20 DIAGNOSIS — E119 Type 2 diabetes mellitus without complications: Secondary | ICD-10-CM

## 2024-05-20 DIAGNOSIS — I10 Essential (primary) hypertension: Secondary | ICD-10-CM

## 2024-05-28 NOTE — Progress Notes (Signed)
 [facility] - Clinic Note  06/11/2024     CHIEF COMPLAINT Patient presents for No chief complaint on file.   HISTORY OF PRESENT ILLNESS: Arthur Torres is a 73 y.o. male who presents to the clinic today for:     Patient states the eyes are dry. And he tested worse on the eye chart.  Referring physician: Gladis Mustard, FNP [address] [place],  [place] [address]  HISTORICAL INFORMATION:   Selected notes from the MEDICAL RECORD NUMBER Referred by Dr. Ross Torres for DM exam LEE:  Ocular Hx- PMH-    CURRENT MEDICATIONS: No current outpatient medications on file. (Ophthalmic Drugs)   No current facility-administered medications for this visit. (Ophthalmic Drugs)   Current Outpatient Medications (Other)  Medication Sig   aspirin EC 81 MG tablet Take 81 mg by mouth daily. Swallow whole.   Aspirin-Acetaminophen (GOODYS BODY PAIN PO) Take 1 Package by mouth 2 (two) times daily as needed (pain).   glipiZIDE  (GLUCOTROL ) 5 MG tablet TAKE 1 TABLET (5 MG TOTAL) BY MOUTH TWICE A DAY BEFORE MEALS   metoprolol  succinate (TOPROL -XL) 50 MG 24 hr tablet TAKE 1 TABLET BY MOUTH DAILY. TAKE WITH OR IMMEDIATELY FOLLOWING A MEAL.   pantoprazole  (PROTONIX ) 40 MG tablet Take 1 tablet (40 mg total) by mouth daily.   rosuvastatin  (CRESTOR ) 10 MG tablet Take 1 tablet (10 mg total) by mouth daily.   sitaGLIPtin -metformin  (JANUMET ) 50-1000 MG tablet TAKE 1 TABLET BY MOUTH TWICE A DAY WITH A MEAL   No current facility-administered medications for this visit. (Other)   REVIEW OF SYSTEMS:        ALLERGIES Allergies  Allergen Reactions   Penicillins Rash    Childhood allergy - rash all over Unknown if more reactions were occuring   Zocor [Simvastatin] Other (See Comments)    Joint pain and tightness    PAST MEDICAL HISTORY Past Medical History:  Diagnosis Date   CAD (coronary artery disease)    1999 occluded LAD treated with PCI and stenting, 80% stenosis  of the first diagonal feeling angioplasty, 60-70% second stenosis, 50% ostial left main stenosis, 25% right coronary artery stenosis, 50% ostial PDA stenosis.   Cancer (HCC)    prostate (treated with radiation)   COVID-19    Diabetes mellitus without complication (HCC)    Hyperlipidemia    Hypertension    MI, old    63   Past Surgical History:  Procedure Laterality Date   CARDIAC CATHETERIZATION     COLON SURGERY     hemrrhoids   CORONARY ANGIOPLASTY WITH STENT PLACEMENT     FAMILY HISTORY Family History  Problem Relation Age of Onset   Diabetes Mother    Stroke Mother    Cancer Father        skin, prostate, stomach   Healthy Daughter    Healthy Son    Asthma Son    SOCIAL HISTORY Social History   Tobacco Use   Smoking status: Former    Current packs/day: 0.00    Average packs/day: 2.0 packs/day for 25.7 years (51.5 ttl pk-yrs)    Types: Cigarettes    Start date: 49    Quit date: 03/28/1998    Years since quitting: 26.1    Passive exposure: Past   Smokeless tobacco: Never  Vaping Use   Vaping status: Never Used  Substance Use Topics   Alcohol use: No   Drug use: No       OPHTHALMIC  EXAM:  Not recorded     IMAGING AND PROCEDURES  Imaging and Procedures for 06/11/2024           ASSESSMENT/PLAN:    ICD-10-CM   1. Moderate nonproliferative diabetic retinopathy of both eyes with macular edema associated with type 2 diabetes mellitus (HCC)  Z88.6686     2. Long term (current) use of oral hypoglycemic drugs  Z79.84     3. Essential hypertension  I10     4. Hypertensive retinopathy of both eyes  H35.033     5. Combined forms of age-related cataract of both eyes  H25.813       1,2. Moderate Non-proliferative diabetic retinopathy, both eyes (OD>OS)  - A1c 5.8 (07.18.25), 5.6 (01.14.25), 5.8 (07.18.25) - exam shows scattered MA OU (OD > OS) - s/p IVA OD #1 (04.08.24), #2 (05.06.24), #3 (06.03.24), #4 (07.01.24), #5 (07.29.24), #6 (08.26.24),  #7 (01.27.25), #8 (02.24.25), #9 (03.24.25), #10 (04.21.25), #11 (05.20.25), #12 (06.17.25), #13 (07.10.25), #14 (08.18.25), #15 (09.15.25), #16 (10.20.25), #17 (11.17.25) ===================== - s/p IVE OD #1 (09.27.24), #2 (10.28.24), #3 (11.25.24), #4 (12.30.24) - FA (04.08.24) shows OD: scattered leaking MA greatest centrally; no NV; Large area of vascular non-perfusion temporal periphery with +perivascular leakage; OS: Scattered MA with late leakage, no NV - BCVA OD 20/60 from 20/50, OS 20/25 from 20/20  - OCT shows OD: +DME/CME - Persistent central IRF/IRHM-- slightly improved; OS: Trace persistent focal cystic changes temporal fovea and mac at 4 weeks - recommend IVA today OD #18 (12.15.25) for DME w/ f/u in 4 wks - pt wishes to proceed - RBA of procedure discussed, questions answered - IVA informed consent obtained and signed, 01.27.25 (OD) - see procedure note - Eylea  approved for 2025 -- but [employer] funding unavailable, patient responsible for 20% - f/u in 4 wks -- DFE/OCT, possible injection(s)  3,4. Hypertensive retinopathy OU - discussed importance of tight BP control - possible RVO/CME component to macular edema OD - monitor  5. Mixed Cataract OU - The symptoms of cataract, surgical options, and treatments and risks were discussed with patient. - discussed diagnosis and progression - referred to [facility] [name] for consult -- pt is deferring consult for now  Ophthalmic Meds Ordered this visit:  No orders of the defined types were placed in this encounter.    No follow-ups on file.  There are no Patient Instructions on file for this visit.  Explained the diagnoses, plan, and follow up with the patient and they expressed understanding.  Patient expressed understanding of the importance of proper follow up care.   This document serves as a record of services personally performed by Redell JUDITHANN Hans, MD, PhD. It was created on their behalf by Avelina Pereyra,  COA an ophthalmic technician. The creation of this record is the provider's dictation and/or activities during the visit.   Electronically signed by: Avelina GORMAN Pereyra, COT  05/28/24  10:54 AM   Redell JUDITHANN Hans, M.D., Ph.D. Diseases & Surgery of the Retina and Vitreous [facility] 06/11/2024  Abbreviations: M myopia (nearsighted); A astigmatism; H hyperopia (farsighted); P presbyopia; Mrx spectacle prescription;  CTL contact lenses; OD right eye; OS left eye; OU both eyes  XT exotropia; ET esotropia; PEK punctate epithelial keratitis; PEE punctate epithelial erosions; DES dry eye syndrome; MGD meibomian gland dysfunction; ATs artificial tears; PFAT's preservative free artificial tears; NSC nuclear sclerotic cataract; PSC posterior subcapsular cataract; ERM epi-retinal membrane; PVD posterior vitreous detachment; RD retinal  detachment; DM diabetes mellitus; DR diabetic retinopathy; NPDR non-proliferative diabetic retinopathy; PDR proliferative diabetic retinopathy; CSME clinically significant macular edema; DME diabetic macular edema; dbh dot blot hemorrhages; CWS cotton wool spot; POAG primary open angle glaucoma; C/D cup-to-disc ratio; HVF humphrey visual field; GVF goldmann visual field; OCT optical coherence tomography; IOP intraocular pressure; BRVO Branch retinal vein occlusion; CRVO central retinal vein occlusion; CRAO central retinal artery occlusion; BRAO branch retinal artery occlusion; RT retinal tear; SB scleral buckle; PPV pars plana vitrectomy; VH Vitreous hemorrhage; PRP panretinal laser photocoagulation; IVK intravitreal kenalog; VMT vitreomacular traction; MH Macular hole;  NVD neovascularization of the disc; NVE neovascularization elsewhere; AREDS age related eye disease study; ARMD age related macular degeneration; POAG primary open angle glaucoma; EBMD epithelial/anterior basement membrane dystrophy; ACIOL anterior chamber intraocular lens; IOL intraocular lens;  PCIOL posterior chamber intraocular lens; Phaco/IOL phacoemulsification with intraocular lens placement; PRK photorefractive keratectomy; LASIK laser assisted in situ keratomileusis; HTN hypertension; DM diabetes mellitus; COPD chronic obstructive pulmonary disease

## 2024-06-11 ENCOUNTER — Encounter (INDEPENDENT_AMBULATORY_CARE_PROVIDER_SITE_OTHER): Payer: Self-pay | Admitting: Ophthalmology

## 2024-06-11 ENCOUNTER — Ambulatory Visit (INDEPENDENT_AMBULATORY_CARE_PROVIDER_SITE_OTHER): Admitting: Ophthalmology

## 2024-06-11 DIAGNOSIS — Z7984 Long term (current) use of oral hypoglycemic drugs: Secondary | ICD-10-CM | POA: Diagnosis not present

## 2024-06-11 DIAGNOSIS — E113313 Type 2 diabetes mellitus with moderate nonproliferative diabetic retinopathy with macular edema, bilateral: Secondary | ICD-10-CM

## 2024-06-11 DIAGNOSIS — H25813 Combined forms of age-related cataract, bilateral: Secondary | ICD-10-CM | POA: Diagnosis not present

## 2024-06-11 DIAGNOSIS — I10 Essential (primary) hypertension: Secondary | ICD-10-CM | POA: Diagnosis not present

## 2024-06-11 DIAGNOSIS — H35033 Hypertensive retinopathy, bilateral: Secondary | ICD-10-CM | POA: Diagnosis not present

## 2024-06-11 MED ORDER — BEVACIZUMAB CHEMO INJECTION 1.25MG/0.05ML SYRINGE FOR KALEIDOSCOPE
1.2500 mg | INTRAVITREAL | Status: AC | PRN
  Administered 2024-06-11: 13:00:00 1.25 mg via INTRAVITREAL

## 2024-06-29 ENCOUNTER — Other Ambulatory Visit: Payer: Self-pay | Admitting: Nurse Practitioner

## 2024-06-29 DIAGNOSIS — K219 Gastro-esophageal reflux disease without esophagitis: Secondary | ICD-10-CM

## 2024-07-06 NOTE — Progress Notes (Signed)
 " [facility] - Clinic Note  07/16/2024     CHIEF COMPLAINT Patient presents for Retina Follow Up   HISTORY OF PRESENT ILLNESS: Arthur Torres is a 74 y.o. male who presents to the clinic today for:   HPI     Retina Follow Up   Patient presents with  Diabetic Retinopathy.  In both eyes.  This started 5 weeks ago.  Duration of 5 weeks.  Since onset it is stable.  I, the attending physician,  performed the HPI with the patient and updated documentation appropriately.        Comments   5 week retina follow up NPDR OU and  IVA OD pt is reporting no vision changes noticed he denies any flashes he has some floaters his last A1C 5.9       Last edited by Valdemar Rogue, MD on 07/16/2024  1:27 PM.     Patient states the vision is the same.  Referring physician: Gladis Mustard, FNP [address] [place],  [place] [address]  HISTORICAL INFORMATION:   Selected notes from the MEDICAL RECORD NUMBER Referred by Dr. Ross Daniels for DM exam LEE:  Ocular Hx- PMH-    CURRENT MEDICATIONS: No current outpatient medications on file. (Ophthalmic Drugs)   No current facility-administered medications for this visit. (Ophthalmic Drugs)   Current Outpatient Medications (Other)  Medication Sig   aspirin EC 81 MG tablet Take 81 mg by mouth daily. Swallow whole.   Aspirin-Acetaminophen (GOODYS BODY PAIN PO) Take 1 Package by mouth 2 (two) times daily as needed (pain).   glipiZIDE  (GLUCOTROL ) 5 MG tablet TAKE 1 TABLET (5 MG TOTAL) BY MOUTH TWICE A DAY BEFORE MEALS   metFORMIN  (GLUCOPHAGE ) 1000 MG tablet Take 1 tablet (1,000 mg total) by mouth 2 (two) times daily with a meal.   metoprolol  succinate (TOPROL -XL) 50 MG 24 hr tablet Take 1 tablet (50 mg total) by mouth daily. TAKE WITH OR IMMEDIATELY FOLLOWING A MEAL.   pantoprazole  (PROTONIX ) 40 MG tablet Take 1 tablet (40 mg total) by mouth daily.   rosuvastatin  (CRESTOR ) 10 MG tablet Take 1 tablet (10 mg total) by mouth  daily.   No current facility-administered medications for this visit. (Other)   REVIEW OF SYSTEMS: ROS   Positive for: Endocrine, Eyes Negative for: Constitutional, Gastrointestinal, Neurological, Skin, Genitourinary, Musculoskeletal, HENT, Cardiovascular, Respiratory, Psychiatric, Allergic/Imm, Heme/Lymph Last edited by Resa Delon ORN, COT on 07/16/2024 12:48 PM.           ALLERGIES Allergies  Allergen Reactions   Penicillins Rash    Childhood allergy - rash all over Unknown if more reactions were occuring   Zocor [Simvastatin] Other (See Comments)    Joint pain and tightness    PAST MEDICAL HISTORY Past Medical History:  Diagnosis Date   CAD (coronary artery disease)    1999 occluded LAD treated with PCI and stenting, 80% stenosis of the first diagonal feeling angioplasty, 60-70% second stenosis, 50% ostial left main stenosis, 25% right coronary artery stenosis, 50% ostial PDA stenosis.   Cancer [facility])    prostate (treated with radiation)   COVID-19    Diabetes mellitus without complication (HCC)    Hyperlipidemia    Hypertension    MI, old    50   Past Surgical History:  Procedure Laterality Date   CARDIAC CATHETERIZATION     COLON SURGERY     hemrrhoids   CORONARY ANGIOPLASTY WITH STENT PLACEMENT     FAMILY HISTORY  Family History  Problem Relation Age of Onset   Diabetes Mother    Stroke Mother    Cancer Father        skin, prostate, stomach   Healthy Daughter    Healthy Son    Asthma Son    SOCIAL HISTORY Social History   Tobacco Use   Smoking status: Former    Current packs/day: 0.00    Average packs/day: 2.0 packs/day for 25.7 years (51.5 ttl pk-yrs)    Types: Cigarettes    Start date: 28    Quit date: 03/28/1998    Years since quitting: 26.3    Passive exposure: Past   Smokeless tobacco: Never  Vaping Use   Vaping status: Never Used  Substance Use Topics   Alcohol use: No   Drug use: No       OPHTHALMIC EXAM:  Base Eye  Exam     Visual Acuity (Snellen - Linear)       Right Left   Dist [place] 20/80 20/30 -1   Dist ph [place] 20/50 -2 20/25 -2         Tonometry (Tonopen, 12:59 PM)       Right Left   Pressure 15 16         Pupils       Pupils Dark Light Shape React APD   Right PERRL 3 2 Round Brisk None   Left PERRL 3 2 Round Brisk None         Visual Fields       Left Right    Full Full         Extraocular Movement       Right Left    Full, Ortho Full, Ortho         Neuro/Psych     Oriented x3: Yes   Mood/Affect: Normal         Dilation     Both eyes: 2.5% Phenylephrine @ 12:59 PM           Slit Lamp and Fundus Exam     Slit Lamp Exam       Right Left   Lids/Lashes Dermatochalasis - upper lid, Meibomian gland dysfunction Dermatochalasis - upper lid, Meibomian gland dysfunction   Conjunctiva/Sclera White and quiet nasal and temporal pinguecula   Cornea arcus Trace tear film debris   Anterior Chamber deep and clear deep and clear   Iris Round and dilated, No NVI Round and dilated, No NVI   Lens 2+ Nuclear sclerosis with brunescence, 2+ Cortical cataract 2+ Nuclear sclerosis with brunescence, 2+ Cortical cataract   Anterior Vitreous mild syneresis mild syneresis         Fundus Exam       Right Left   Disc Pink and Sharp, +PPP Pink and Sharp, mild PPP   C/D Ratio 0.2 0.2   Macula Blunted foveal reflex, central edema / cystic changes, scattered MA/DBH greatest temporal mac Flat, Blunted foveal reflex, scattered MA, trace cystic changes temporal fovea   Vessels attenuated, Tortuous attenuated, mild tortuosity   Periphery Attached, 360 MA / DBH -- improved Attached, rare MA           Refraction     Wearing Rx       Sphere Cylinder Axis Add   Right -1.50 +0.75 114 +2.50   Left -1.50 +0.25 163 +2.50            IMAGING AND PROCEDURES  Imaging and Procedures for 07/16/2024  OCT, Retina -  OU - Both Eyes       Right Eye Quality was good. Central  Foveal Thickness: 332. Progression has worsened. Findings include no SRF, abnormal foveal contour, intraretinal hyper-reflective material, intraretinal fluid, vitreomacular adhesion (Persistent central IRF/IRHM-- slightly increased).   Left Eye Quality was good. Central Foveal Thickness: 310. Progression has been stable. Findings include normal foveal contour, no SRF, intraretinal hyper-reflective material, intraretinal fluid (Trace persistent focal cystic changes temporal fovea and mac ).   Notes *Images captured and stored on drive  Diagnosis / Impression:  OD: +DME/CME - Persistent central IRF/IRHM-- slightly increased OS: Trace persistent focal cystic changes temporal fovea and mac  Clinical management:  See below  Abbreviations: NFP - Normal foveal profile. CME - cystoid macular edema. PED - pigment epithelial detachment. IRF - intraretinal fluid. SRF - subretinal fluid. EZ - ellipsoid zone. ERM - epiretinal membrane. ORA - outer retinal atrophy. ORT - outer retinal tubulation. SRHM - subretinal hyper-reflective material. IRHM - intraretinal hyper-reflective material      Intravitreal Injection, Pharmacologic Agent - OD - Right Eye       Time Out 07/16/2024. 1:12 PM. Confirmed correct patient, procedure, site, and patient consented.   Anesthesia Topical anesthesia was used. Anesthetic medications included Lidocaine 2%, Proparacaine 0.5%.   Procedure Preparation included 5% betadine to ocular surface, eyelid speculum. A (32g) needle was used.   Injection: 1.25 mg Bevacizumab  1.25mg /0.24ml   Route: Intravitreal, Site: Right Eye   NDC: [id number], Lot: [id number], Expiration date: 09/13/2024   Post-op Post injection exam found visual acuity of at least counting fingers. The patient tolerated the procedure well. There were no complications. The patient received written and verbal post procedure care education. Post injection medications were not given.              ASSESSMENT/PLAN:    ICD-10-CM   1. Moderate nonproliferative diabetic retinopathy of both eyes with macular edema associated with type 2 diabetes mellitus (HCC)  E11.3313 OCT, Retina - OU - Both Eyes    Intravitreal Injection, Pharmacologic Agent - OD - Right Eye    Bevacizumab  (AVASTIN ) SOLN 1.25 mg    2. Long term (current) use of oral hypoglycemic drugs  Z79.84     3. Essential hypertension  I10     4. Hypertensive retinopathy of both eyes  H35.033     5. Combined forms of age-related cataract of both eyes  H25.813      1,2. Moderate Non-proliferative diabetic retinopathy, both eyes (OD>OS)  - A1c 5.9 (01.16.26), 5.8 (07.18.25), 5.6 (01.14.25), 5.8 (07.18.25) - exam shows scattered MA OU (OD > OS) - s/p IVA OD #1 (04.08.24), #2 (05.06.24), #3 (06.03.24), #4 (07.01.24), #5 (07.29.24), #6 (08.26.24), #7 (01.27.25), #8 (02.24.25), #9 (03.24.25), #10 (04.21.25), #11 (05.20.25), #12 (06.17.25), #13 (07.10.25), #14 (08.18.25), #15 (09.15.25), #16 (10.20.25), #17 (11.17.25), #18 (12.15.25) - IVA resistance ===================== - s/p IVE OD #1 (09.27.24), #2 (10.28.24), #3 (11.25.24), #4 (12.30.24) - FA (04.08.24) shows OD: scattered leaking MA greatest centrally; no NV; Large area of vascular non-perfusion temporal periphery with +perivascular leakage; OS: Scattered MA with late leakage, no NV - BCVA OD 20/50-stable, OS 20/25 from 20/20  - OCT shows OD: +DME/CME - Persistent central IRF/IRHM-- slightly increased; OS: Trace persistent focal cystic changes temporal fovea and mac at 5 weeks - recommend IVA today OD #19 (01.19.26) for DME w/ f/u in 4-5 wks - pt wishes to proceed - RBA of procedure discussed, questions answered - IVA informed consent obtained and signed, 01.27.25 (  OD) - see procedure note - Eylea  approved for 2025 -- but [employer] funding unavailable, patient responsible for 20% - f/u in 4-5 wks -- DFE/OCT, possible injection(s)  3,4. Hypertensive retinopathy OU -  discussed importance of tight BP control - possible RVO/CME component to macular edema OD - monitor   5. Mixed Cataract OU - The symptoms of cataract, surgical options, and treatments and risks were discussed with patient. - discussed diagnosis and progression - referred to [facility] for consult   Ophthalmic Meds Ordered this visit:  Meds ordered this encounter  Medications   Bevacizumab  (AVASTIN ) SOLN 1.25 mg     Return in about 4 weeks (around 08/13/2024) for NPDR, DFE, OCT, Possible, IVA, OD.  There are no Patient Instructions on file for this visit.  Explained the diagnoses, plan, and follow up with the patient and they expressed understanding.  Patient expressed understanding of the importance of proper follow up care.   This document serves as a record of services personally performed by Redell JUDITHANN Hans, MD, PhD. It was created on their behalf by Paulina Jamse Gay an ophthalmic technician. The creation of this record is the provider's dictation and/or activities during the visit.   Electronically signed by: Alana D Fowler  07/16/24  1:28 PM   This document serves as a record of services personally performed by Redell JUDITHANN Hans, MD, PhD. It was created on their behalf by Wanda GEANNIE Keens, COT an ophthalmic technician. The creation of this record is the provider's dictation and/or activities during the visit.    Electronically signed by:  Wanda GEANNIE Keens, COT  07/16/24 1:28 PM  Redell JUDITHANN Hans, M.D., Ph.D. Diseases & Surgery of the Retina and Vitreous [facility]  I have reviewed the above documentation for accuracy and completeness, and I agree with the above. Redell JUDITHANN Hans, M.D., Ph.D. 07/16/24 1:28 PM    Abbreviations: M myopia (nearsighted); A astigmatism; H hyperopia (farsighted); P presbyopia; Mrx spectacle prescription;  CTL contact lenses; OD right eye; OS left eye; OU both eyes  XT exotropia; ET esotropia; PEK  punctate epithelial keratitis; PEE punctate epithelial erosions; DES dry eye syndrome; MGD meibomian gland dysfunction; ATs artificial tears; PFAT's preservative free artificial tears; NSC nuclear sclerotic cataract; PSC posterior subcapsular cataract; ERM epi-retinal membrane; PVD posterior vitreous detachment; RD retinal detachment; DM diabetes mellitus; DR diabetic retinopathy; NPDR non-proliferative diabetic retinopathy; PDR proliferative diabetic retinopathy; CSME clinically significant macular edema; DME diabetic macular edema; dbh dot blot hemorrhages; CWS cotton wool spot; POAG primary open angle glaucoma; C/D cup-to-disc ratio; HVF humphrey visual field; GVF goldmann visual field; OCT optical coherence tomography; IOP intraocular pressure; BRVO Branch retinal vein occlusion; CRVO central retinal vein occlusion; CRAO central retinal artery occlusion; BRAO branch retinal artery occlusion; RT retinal tear; SB scleral buckle; PPV pars plana vitrectomy; VH Vitreous hemorrhage; PRP panretinal laser photocoagulation; IVK intravitreal kenalog; VMT vitreomacular traction; MH Macular hole;  NVD neovascularization of the disc; NVE neovascularization elsewhere; AREDS age related eye disease study; ARMD age related macular degeneration; POAG primary open angle glaucoma; EBMD epithelial/anterior basement membrane dystrophy; ACIOL anterior chamber intraocular lens; IOL intraocular lens; PCIOL posterior chamber intraocular lens; Phaco/IOL phacoemulsification with intraocular lens placement; PRK photorefractive keratectomy; LASIK laser assisted in situ keratomileusis; HTN hypertension; DM diabetes mellitus; COPD chronic obstructive pulmonary disease "

## 2024-07-13 ENCOUNTER — Ambulatory Visit (INDEPENDENT_AMBULATORY_CARE_PROVIDER_SITE_OTHER): Payer: Self-pay | Admitting: Nurse Practitioner

## 2024-07-13 ENCOUNTER — Encounter: Payer: Self-pay | Admitting: Nurse Practitioner

## 2024-07-13 VITALS — BP 129/70 | HR 71 | Temp 98.1°F | Ht 70.0 in | Wt 182.0 lb

## 2024-07-13 DIAGNOSIS — I10 Essential (primary) hypertension: Secondary | ICD-10-CM

## 2024-07-13 DIAGNOSIS — E119 Type 2 diabetes mellitus without complications: Secondary | ICD-10-CM

## 2024-07-13 DIAGNOSIS — K219 Gastro-esophageal reflux disease without esophagitis: Secondary | ICD-10-CM

## 2024-07-13 DIAGNOSIS — Z7984 Long term (current) use of oral hypoglycemic drugs: Secondary | ICD-10-CM

## 2024-07-13 DIAGNOSIS — E1169 Type 2 diabetes mellitus with other specified complication: Secondary | ICD-10-CM

## 2024-07-13 DIAGNOSIS — I2510 Atherosclerotic heart disease of native coronary artery without angina pectoris: Secondary | ICD-10-CM

## 2024-07-13 DIAGNOSIS — E785 Hyperlipidemia, unspecified: Secondary | ICD-10-CM

## 2024-07-13 DIAGNOSIS — Z6827 Body mass index (BMI) 27.0-27.9, adult: Secondary | ICD-10-CM

## 2024-07-13 LAB — BAYER DCA HB A1C WAIVED: HB A1C (BAYER DCA - WAIVED): 5.9 % — ABNORMAL HIGH (ref 4.8–5.6)

## 2024-07-13 LAB — LIPID PANEL

## 2024-07-13 MED ORDER — PANTOPRAZOLE SODIUM 40 MG PO TBEC: 40.0000 mg | DELAYED_RELEASE_TABLET | Freq: Every day | ORAL | 1 refills | Status: AC

## 2024-07-13 MED ORDER — METOPROLOL SUCCINATE ER 50 MG PO TB24: 50.0000 mg | ORAL_TABLET | Freq: Every day | ORAL | 1 refills | Status: AC

## 2024-07-13 MED ORDER — METFORMIN HCL 1000 MG PO TABS: 1000.0000 mg | ORAL_TABLET | Freq: Two times a day (BID) | ORAL | 1 refills | Status: AC

## 2024-07-13 MED ORDER — GLIPIZIDE 5 MG PO TABS: ORAL_TABLET | ORAL | 1 refills | Status: AC

## 2024-07-13 MED ORDER — ROSUVASTATIN CALCIUM 10 MG PO TABS: 10.0000 mg | ORAL_TABLET | Freq: Every day | ORAL | 1 refills | Status: AC

## 2024-07-13 NOTE — Patient Instructions (Signed)

## 2024-07-13 NOTE — Progress Notes (Signed)
 "  Subjective:    Patient ID: Arthur Torres, male    DOB: 07-23-1950, 74 y.o.   MRN: [id number]   Chief Complaint: medical management of chronic issues     HPI:  Arthur Torres is a 74 y.o. who identifies as a male who was assigned male at birth.   Social history: Lives with: wife Work history: repairs motors   Comes in today for follow up of the following chronic medical issues:  1. Essential hypertension, benign No c/o chest pain, sob or headache. Does not check blood pressure at home. BP Readings from Last 3 Encounters:  02/01/24 110/60  01/13/24 126/69  12/28/23 128/70     2. Hyperlipidemia associated with type 2 diabetes mellitus (HCC) Tries to watch diet but does no dedicated exercise. Lab Results  Component Value Date   CHOL 118 01/13/2024   HDL 40 01/13/2024   LDLCALC 59 01/13/2024   TRIG 98 01/13/2024   CHOLHDL 3.0 01/13/2024     3. Diabetes mellitus treated with oral medication (HCC) He does not check his blood sugars at home. We halfed his glipizide  at last visit. Janumet  has gotten to expensive so he went back to taking metformin .  Lab Results  Component Value Date   HGBA1C 5.8 (H) 01/13/2024     4. Gastroesophageal reflux disease without esophagitis Is on protonix  and is doing well.  5. CAD Last visit with cardiology was on 02/01/24. Review of office note showed no change in care and he was to follow up in 6 months.    6.BMI 27.0-27.9,adult No recent weight changes Wt Readings from Last 3 Encounters:  07/13/24 182 lb (82.6 kg)  02/01/24 180 lb (81.6 kg)  01/27/24 176 lb (79.8 kg)   BMI Readings from Last 3 Encounters:  07/13/24 26.11 kg/m  02/01/24 25.83 kg/m  01/27/24 25.25 kg/m     New complaints: None today  Allergies  Allergen Reactions   Penicillins Rash    Childhood allergy - rash all over Unknown if more reactions were occuring   Zocor [Simvastatin] Other (See Comments)    Joint pain and tightness    Outpatient  Encounter Medications as of 07/13/2024  Medication Sig   aspirin EC 81 MG tablet Take 81 mg by mouth daily. Swallow whole.   Aspirin-Acetaminophen (GOODYS BODY PAIN PO) Take 1 Package by mouth 2 (two) times daily as needed (pain).   glipiZIDE  (GLUCOTROL ) 5 MG tablet TAKE 1 TABLET (5 MG TOTAL) BY MOUTH TWICE A DAY BEFORE MEALS   metoprolol  succinate (TOPROL -XL) 50 MG 24 hr tablet TAKE 1 TABLET BY MOUTH DAILY. TAKE WITH OR IMMEDIATELY FOLLOWING A MEAL.   pantoprazole  (PROTONIX ) 40 MG tablet TAKE 1 TABLET BY MOUTH EVERY DAY   rosuvastatin  (CRESTOR ) 10 MG tablet Take 1 tablet (10 mg total) by mouth daily.   sitaGLIPtin -metformin  (JANUMET ) 50-1000 MG tablet TAKE 1 TABLET BY MOUTH TWICE A DAY WITH A MEAL   No facility-administered encounter medications on file as of 07/13/2024.    Past Surgical History:  Procedure Laterality Date   CARDIAC CATHETERIZATION     COLON SURGERY     hemrrhoids   CORONARY ANGIOPLASTY WITH STENT PLACEMENT      Family History  Problem Relation Age of Onset   Diabetes Mother    Stroke Mother    Cancer Father        skin, prostate, stomach   Healthy Daughter    Healthy Son    Asthma Son  Controlled substance contract: n/a     Review of Systems  Constitutional:  Negative for diaphoresis.  Eyes:  Negative for pain.  Respiratory:  Negative for shortness of breath.   Cardiovascular:  Negative for chest pain, palpitations and leg swelling.  Gastrointestinal:  Negative for abdominal pain.  Endocrine: Negative for polydipsia.  Skin:  Negative for rash.  Neurological:  Negative for dizziness, weakness and headaches.  Hematological:  Does not bruise/bleed easily.  All other systems reviewed and are negative.      Objective:   Physical Exam Vitals and nursing note reviewed.  Constitutional:      Appearance: Normal appearance. He is well-developed.  HENT:     Head: Normocephalic.     Nose: Nose normal.     Mouth/Throat:     Mouth: Mucous  membranes are moist.     Pharynx: Oropharynx is clear.  Eyes:     Pupils: Pupils are equal, round, and reactive to light.  Neck:     Thyroid : No thyroid  mass or thyromegaly.     Vascular: No carotid bruit or JVD.     Trachea: Phonation normal.  Cardiovascular:     Rate and Rhythm: Normal rate and regular rhythm.  Pulmonary:     Effort: Pulmonary effort is normal. No respiratory distress.     Breath sounds: Normal breath sounds.  Abdominal:     General: Bowel sounds are normal.     Palpations: Abdomen is soft.     Tenderness: There is no abdominal tenderness.  Musculoskeletal:        General: Normal range of motion.     Cervical back: Normal range of motion and neck supple.  Lymphadenopathy:     Cervical: No cervical adenopathy.  Skin:    General: Skin is warm and dry.  Neurological:     Mental Status: He is alert and oriented to person, place, and time.  Psychiatric:        Behavior: Behavior normal.        Thought Content: Thought content normal.        Judgment: Judgment normal.     BP 129/70   Pulse 71   Temp 98.1 F (36.7 C) (Temporal)   Ht 5' 10 (1.778 m)   Wt 182 lb (82.6 kg)   SpO2 98%   BMI 26.11 kg/m      Hgba1c 5.9%     Assessment & Plan:   Arthur Torres comes in today with chief complaint of Medical Management of Chronic Issues   Diagnosis and orders addressed:  1. Essential hypertension, benign (Primary) Dash diet - CBC with Differential/Platelet - CMP14+EGFR - metoprolol  succinate (TOPROL -XL) 50 MG 24 hr tablet; Take 1 tablet (50 mg total) by mouth daily. TAKE WITH OR IMMEDIATELY FOLLOWING A MEAL.  Dispense: 90 tablet; Refill: 1  2. Coronary artery disease involving native coronary artery of native heart without angina pectoris Keep follow up with cardiology  3. Hyperlipidemia associated with type 2 diabetes mellitus (HCC) Low fat diet - Lipid panel - rosuvastatin  (CRESTOR ) 10 MG tablet; Take 1 tablet (10 mg total) by mouth daily.   Dispense: 90 tablet; Refill: 1  4. Diabetes mellitus treated with oral medication (HCC) Continue to watch carbs Stopped janumet  due to expense Back on metformin  Increased glipizide  to 1 tablet daily- if develop lows go back to 1/2 table - Bayer DCA Hb A1c Waived - Microalbumin / creatinine urine ratio - glipiZIDE  (GLUCOTROL ) 5 MG tablet; TAKE 1 TABLET (5 MG  TOTAL) BY MOUTH TWICE A DAY BEFORE MEALS  Dispense: 180 tablet; Refill: 1  5. Gastroesophageal reflux disease without esophagitis Avoid spicy foods Do not eat 2 hours prior to bedtime  - pantoprazole  (PROTONIX ) 40 MG tablet; Take 1 tablet (40 mg total) by mouth daily.  Dispense: 90 tablet; Refill: 1  6. BMI 27.0-27.9,adult Discussed diet and exercise for person with BMI >25 Will recheck weight in 3-6 months    Labs pending Health Maintenance reviewed Diet and exercise encouraged  Follow up plan: 3 months   Mary-Margaret Gladis, FNP  "

## 2024-07-14 LAB — CMP14+EGFR
ALT: 28 IU/L (ref 0–44)
AST: 19 IU/L (ref 0–40)
Albumin: 4.9 g/dL — ABNORMAL HIGH (ref 3.8–4.8)
Alkaline Phosphatase: 70 IU/L (ref 47–123)
BUN/Creatinine Ratio: 18 (ref 10–24)
BUN: 22 mg/dL (ref 8–27)
Bilirubin Total: 0.5 mg/dL (ref 0.0–1.2)
CO2: 21 mmol/L (ref 20–29)
Calcium: 10.2 mg/dL (ref 8.6–10.2)
Chloride: 101 mmol/L (ref 96–106)
Creatinine, Ser: 1.22 mg/dL (ref 0.76–1.27)
Globulin, Total: 2.5 g/dL (ref 1.5–4.5)
Glucose: 130 mg/dL — ABNORMAL HIGH (ref 70–99)
Potassium: 4.8 mmol/L (ref 3.5–5.2)
Sodium: 140 mmol/L (ref 134–144)
Total Protein: 7.4 g/dL (ref 6.0–8.5)
eGFR: 63 mL/min/1.73

## 2024-07-14 LAB — CBC WITH DIFFERENTIAL/PLATELET
Basophils Absolute: 0.1 x10E3/uL (ref 0.0–0.2)
Basos: 2 %
EOS (ABSOLUTE): 0.2 x10E3/uL (ref 0.0–0.4)
Eos: 4 %
Hematocrit: 49.6 % (ref 37.5–51.0)
Hemoglobin: 16 g/dL (ref 13.0–17.7)
Immature Grans (Abs): 0 x10E3/uL (ref 0.0–0.1)
Immature Granulocytes: 0 %
Lymphocytes Absolute: 1.8 x10E3/uL (ref 0.7–3.1)
Lymphs: 33 %
MCH: 29.1 pg (ref 26.6–33.0)
MCHC: 32.3 g/dL (ref 31.5–35.7)
MCV: 90 fL (ref 79–97)
Monocytes Absolute: 0.4 x10E3/uL (ref 0.1–0.9)
Monocytes: 7 %
Neutrophils Absolute: 3 x10E3/uL (ref 1.4–7.0)
Neutrophils: 54 %
Platelets: 164 x10E3/uL (ref 150–450)
RBC: 5.5 x10E6/uL (ref 4.14–5.80)
RDW: 13.3 % (ref 11.6–15.4)
WBC: 5.6 x10E3/uL (ref 3.4–10.8)

## 2024-07-14 LAB — LIPID PANEL
Chol/HDL Ratio: 2.9 ratio (ref 0.0–5.0)
Cholesterol, Total: 128 mg/dL (ref 100–199)
HDL: 44 mg/dL
LDL Chol Calc (NIH): 65 mg/dL (ref 0–99)
Triglycerides: 100 mg/dL (ref 0–149)
VLDL Cholesterol Cal: 19 mg/dL (ref 5–40)

## 2024-07-14 LAB — MICROALBUMIN / CREATININE URINE RATIO
Creatinine, Urine: 45.9 mg/dL
Microalb/Creat Ratio: <7 mg/g{creat} (ref 0–29)
Microalbumin, Urine: <3 ug/mL

## 2024-07-16 ENCOUNTER — Ambulatory Visit (INDEPENDENT_AMBULATORY_CARE_PROVIDER_SITE_OTHER): Admitting: Ophthalmology

## 2024-07-16 ENCOUNTER — Ambulatory Visit: Payer: Self-pay | Admitting: Nurse Practitioner

## 2024-07-16 ENCOUNTER — Encounter (INDEPENDENT_AMBULATORY_CARE_PROVIDER_SITE_OTHER): Payer: Self-pay | Admitting: Ophthalmology

## 2024-07-16 DIAGNOSIS — I10 Essential (primary) hypertension: Secondary | ICD-10-CM | POA: Diagnosis not present

## 2024-07-16 DIAGNOSIS — E113313 Type 2 diabetes mellitus with moderate nonproliferative diabetic retinopathy with macular edema, bilateral: Secondary | ICD-10-CM | POA: Diagnosis not present

## 2024-07-16 DIAGNOSIS — H35033 Hypertensive retinopathy, bilateral: Secondary | ICD-10-CM

## 2024-07-16 DIAGNOSIS — H25813 Combined forms of age-related cataract, bilateral: Secondary | ICD-10-CM | POA: Diagnosis not present

## 2024-07-16 DIAGNOSIS — Z7984 Long term (current) use of oral hypoglycemic drugs: Secondary | ICD-10-CM | POA: Diagnosis not present

## 2024-07-16 MED ORDER — BEVACIZUMAB CHEMO INJECTION 1.25MG/0.05ML SYRINGE FOR KALEIDOSCOPE
1.2500 mg | INTRAVITREAL | Status: AC | PRN
  Administered 2024-07-16: 1.25 mg via INTRAVITREAL

## 2024-08-13 ENCOUNTER — Encounter (INDEPENDENT_AMBULATORY_CARE_PROVIDER_SITE_OTHER): Admitting: Ophthalmology

## 2024-10-11 ENCOUNTER — Ambulatory Visit: Admitting: Nurse Practitioner

## 2025-01-29 ENCOUNTER — Ambulatory Visit: Payer: Self-pay
